# Patient Record
Sex: Male | Born: 1980 | Race: Black or African American | Hispanic: No | Marital: Single | Smoking: Light tobacco smoker
Health system: Southern US, Community
[De-identification: ages and names within clinical notes are randomized; demographics above are authoritative.]

## PROBLEM LIST (undated history)

## (undated) DIAGNOSIS — S62339A Displaced fracture of neck of unspecified metacarpal bone, initial encounter for closed fracture: Secondary | ICD-10-CM

## (undated) DIAGNOSIS — F32A Depression, unspecified: Secondary | ICD-10-CM

## (undated) DIAGNOSIS — F1910 Other psychoactive substance abuse, uncomplicated: Secondary | ICD-10-CM

## (undated) DIAGNOSIS — F329 Major depressive disorder, single episode, unspecified: Secondary | ICD-10-CM

## (undated) DIAGNOSIS — T7840XA Allergy, unspecified, initial encounter: Secondary | ICD-10-CM

## (undated) DIAGNOSIS — F419 Anxiety disorder, unspecified: Secondary | ICD-10-CM

## (undated) HISTORY — DX: Allergy, unspecified, initial encounter: T78.40XA

---
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
## 2002-07-30 ENCOUNTER — Encounter: Payer: Self-pay | Admitting: Emergency Medicine

## 2002-07-30 ENCOUNTER — Emergency Department (HOSPITAL_COMMUNITY): Admission: EM | Admit: 2002-07-30 | Discharge: 2002-07-30 | Payer: Self-pay | Admitting: Emergency Medicine

## 2005-03-10 ENCOUNTER — Emergency Department (HOSPITAL_COMMUNITY): Admission: EM | Admit: 2005-03-10 | Discharge: 2005-03-10 | Payer: Self-pay | Admitting: Family Medicine

## 2009-06-01 ENCOUNTER — Ambulatory Visit: Payer: Self-pay | Admitting: Internal Medicine

## 2009-06-01 DIAGNOSIS — J3089 Other allergic rhinitis: Secondary | ICD-10-CM | POA: Insufficient documentation

## 2009-06-01 DIAGNOSIS — N4889 Other specified disorders of penis: Secondary | ICD-10-CM | POA: Insufficient documentation

## 2009-06-01 LAB — CONVERTED CEMR LAB
Bilirubin Urine: NEGATIVE
Ketones, ur: NEGATIVE mg/dL
Leukocytes, UA: NEGATIVE
Nitrite: NEGATIVE
Specific Gravity, Urine: 1.025 (ref 1.000–1.030)
Urobilinogen, UA: 0.2 (ref 0.0–1.0)
pH: 7.5 (ref 5.0–8.0)

## 2009-06-02 ENCOUNTER — Encounter: Payer: Self-pay | Admitting: Internal Medicine

## 2009-06-09 ENCOUNTER — Ambulatory Visit: Payer: Self-pay | Admitting: Internal Medicine

## 2009-06-09 LAB — CONVERTED CEMR LAB
Chlamydia, Swab/Urine, PCR: NEGATIVE
GC Probe Amp, Urine: NEGATIVE

## 2009-09-02 ENCOUNTER — Ambulatory Visit: Payer: Self-pay | Admitting: Internal Medicine

## 2009-09-02 DIAGNOSIS — J309 Allergic rhinitis, unspecified: Secondary | ICD-10-CM | POA: Insufficient documentation

## 2009-09-02 DIAGNOSIS — L049 Acute lymphadenitis, unspecified: Secondary | ICD-10-CM | POA: Insufficient documentation

## 2009-09-02 LAB — CONVERTED CEMR LAB
Basophils Relative: 1.3 % (ref 0.0–3.0)
Eosinophils Relative: 6.8 % — ABNORMAL HIGH (ref 0.0–5.0)
Hemoglobin: 15.3 g/dL (ref 13.0–17.0)
Lymphocytes Relative: 40.9 % (ref 12.0–46.0)
Monocytes Relative: 12.2 % — ABNORMAL HIGH (ref 3.0–12.0)
Neutro Abs: 2 10*3/uL (ref 1.4–7.7)
Neutrophils Relative %: 38.8 % — ABNORMAL LOW (ref 43.0–77.0)
RBC: 4.91 M/uL (ref 4.22–5.81)
WBC: 5.1 10*3/uL (ref 4.5–10.5)

## 2009-10-24 ENCOUNTER — Ambulatory Visit: Payer: Self-pay | Admitting: Internal Medicine

## 2009-10-24 DIAGNOSIS — L989 Disorder of the skin and subcutaneous tissue, unspecified: Secondary | ICD-10-CM | POA: Insufficient documentation

## 2009-10-24 LAB — CONVERTED CEMR LAB
BUN: 8 mg/dL (ref 6–23)
Basophils Absolute: 0 10*3/uL (ref 0.0–0.1)
CO2: 35 meq/L — ABNORMAL HIGH (ref 19–32)
Chloride: 103 meq/L (ref 96–112)
GFR calc non Af Amer: 131.11 mL/min (ref >60)
Glucose, Bld: 62 mg/dL — ABNORMAL LOW (ref 70–99)
HCT: 44.9 % (ref 39.0–52.0)
Hemoglobin: 15.6 g/dL (ref 13.0–17.0)
Lymphs Abs: 1.9 10*3/uL (ref 0.7–4.0)
MCHC: 34.8 g/dL (ref 30.0–36.0)
MCV: 88.1 fL (ref 78.0–100.0)
Monocytes Absolute: 0.5 10*3/uL (ref 0.1–1.0)
Monocytes Relative: 7.5 % (ref 3.0–12.0)
Neutro Abs: 4.2 10*3/uL (ref 1.4–7.7)
Potassium: 4.9 meq/L (ref 3.5–5.1)
RDW: 12.8 % (ref 11.5–14.6)
Sodium: 142 meq/L (ref 135–145)

## 2009-10-28 ENCOUNTER — Ambulatory Visit: Payer: Self-pay | Admitting: Cardiovascular Disease

## 2009-11-21 ENCOUNTER — Telehealth: Payer: Self-pay | Admitting: Internal Medicine

## 2009-11-26 ENCOUNTER — Encounter: Payer: Self-pay | Admitting: Internal Medicine

## 2010-10-27 ENCOUNTER — Ambulatory Visit: Payer: Self-pay | Admitting: Internal Medicine

## 2010-10-27 DIAGNOSIS — F17200 Nicotine dependence, unspecified, uncomplicated: Secondary | ICD-10-CM | POA: Insufficient documentation

## 2010-10-27 LAB — CONVERTED CEMR LAB
ALT: 14 units/L (ref 0–53)
AST: 24 units/L (ref 0–37)
Albumin: 4.3 g/dL (ref 3.5–5.2)
Chloride: 103 meq/L (ref 96–112)
Eosinophils Relative: 9.2 % — ABNORMAL HIGH (ref 0.0–5.0)
GFR calc non Af Amer: 130.11 mL/min (ref >60.00)
Glucose, Bld: 86 mg/dL (ref 70–99)
HCT: 46.3 % (ref 39.0–52.0)
Hemoglobin: 16 g/dL (ref 13.0–17.0)
Lymphs Abs: 1.9 10*3/uL (ref 0.7–4.0)
Monocytes Relative: 11.8 % (ref 3.0–12.0)
Neutro Abs: 2.6 10*3/uL (ref 1.4–7.7)
Potassium: 5.8 meq/L — ABNORMAL HIGH (ref 3.5–5.1)
Sodium: 142 meq/L (ref 135–145)
TSH: 0.38 microintl units/mL (ref 0.35–5.50)
Total Protein: 7.6 g/dL (ref 6.0–8.3)
VLDL: 9.6 mg/dL (ref 0.0–40.0)
WBC: 5.7 10*3/uL (ref 4.5–10.5)

## 2010-12-12 NOTE — Progress Notes (Signed)
Summary: RESULTS  Phone Note Call from Patient Call back at [phone]   Summary of Call: Patient is requesting results of CT, labs & xray.  Initial call taken by: Lamar Sprinkles, CMA,  November 21, 2009 10:50 AM  Follow-up for Phone Call        labs and chest xray normal. CT showed some tooth disease but no problems with lmph nodes Follow-up by: Etta Grandchild MD,  November 21, 2009 10:54 AM  Additional Follow-up for Phone Call Additional follow up Details #1::        Patient notified. Additional Follow-up by: Lucious Groves,  November 21, 2009 4:25 PM

## 2010-12-12 NOTE — Letter (Signed)
Summary: Mertha Finders MD  Mertha Finders MD   Imported By: Sherian Rein 12/08/2009 12:03:33  _____________________________________________________________________  External Attachment:    Type:   Image     Comment:   External Document

## 2010-12-14 NOTE — Assessment & Plan Note (Signed)
Summary: Cpx/Bcbs/will come fasting/#/cd   Allergies: 1)  ! Doxycycline

## 2010-12-14 NOTE — Assessment & Plan Note (Signed)
Summary: Office visit   Vital Signs:  Patient profile:   30 year old male Height:      76 inches Weight:      170 pounds BMI:     20.77 O2 Sat:      97 % on Room air Temp:     97.4 degrees F oral Pulse rate:   61 / minute Pulse rhythm:   regular Resp:     16 per minute BP sitting:   110 / 70  (left arm) Cuff size:   large  Vitals Entered By: Rock Nephew CMA (October 27, 2010 8:18 AM)  O2 Flow:  Room air CC: Patient here for CPX w/labs Is Patient Diabetic? No Pain Assessment Patient in pain? no       Does patient need assistance? Functional Status Self care Ambulation Normal   Primary Care Venora Kautzman:  Etta Grandchild MD  CC:  Patient here for CPX w/labs.  History of Present Illness: He returns for a complete physical but he has complaints.  1. he wants to quit smoking and he wants me to write an Rx for nicotine patches so he can use his benefits card  2. he has a recurrence of an enlarged lymph node on his right side of his neck - this same nodal area was investigated one year ago and a CT scan was unremarkable, the area is not painful  Preventive Screening-Counseling & Management  Alcohol-Tobacco     Alcohol drinks/day: 1     Alcohol type: beer     >5/day in last 3 mos: no     Alcohol Counseling: not indicated; use of alcohol is not excessive or problematic     Feels need to cut down: no     Feels annoyed by complaints: no     Feels guilty re: drinking: no     Needs 'eye opener' in am: no     Smoking Status: quit     Smoking Cessation Counseling: yes     Smoke Cessation Stage: ready     Packs/Day: 0.75     Year Quit: 2001     Pack years: 8     Tobacco Counseling: to quit use of tobacco products  Hep-HIV-STD-Contraception     Hepatitis Risk: no risk noted     HIV Risk: no risk noted     STD Risk: no risk noted     Dental Visit-last 6 months no     Dental Care Counseling: to seek dental care; no dental care within six months     TSE monthly: yes     Testicular SE Education/Counseling to perform regular STE  Safety-Violence-Falls     Seat Belt Use: yes     Helmet Use: n/a     Firearms in the Home: no firearms in the home     Smoke Detectors: yes     Violence in the Home: no risk noted      Sexual History:  currently monogamous.        Drug Use:  never.        Blood Transfusions:  no.    Clinical Review Panels:  Immunizations   Last Tetanus Booster:  Tdap (08/18/2008)  Diabetes Management   Creatinine:  0.9 (10/24/2009)  CBC   WBC:  7.2 (10/24/2009)   RBC:  5.09 (10/24/2009)   Hgb:  15.6 (10/24/2009)   Hct:  44.9 (10/24/2009)   Platelets:  231.0 (10/24/2009)   MCV  88.1 (10/24/2009)   MCHC  34.8 (10/24/2009)   RDW  12.8 (10/24/2009)   PMN:  58.1 (10/24/2009)   Lymphs:  26.2 (10/24/2009)   Monos:  7.5 (10/24/2009)   Eosinophils:  8.2 (10/24/2009)   Basophil:  0.0 (10/24/2009)  Complete Metabolic Panel   Glucose:  62 (10/24/2009)   Sodium:  142 (10/24/2009)   Potassium:  4.9 (10/24/2009)   Chloride:  103 (10/24/2009)   CO2:  35 (10/24/2009)   BUN:  8 (10/24/2009)   Creatinine:  0.9 (10/24/2009)   Calcium:  9.6 (10/24/2009)   Medications Prior to Update: 1)  Allegra-D 24 Hour 180-240 Mg Xr24h-Tab (Fexofenadine-Pseudoephedrine) .... One By Mouth Qd As Needed For Allergies  Current Medications (verified): 1)  Nicoderm Cq 21 Mg/24hr Pt24 (Nicotine) .... Use One Per Day For 2 Weeks Then Decrease 2)  Nicoderm Cq 14 Mg/24hr Pt24 (Nicotine) .... Use One Per Day Fpr 2 Weeks Then Decrease 3)  Nicoderm Cq 7 Mg/24hr Pt24 (Nicotine) .... Use One Per Day For 2 Weeks Then Stop 4)  Cefuroxime Axetil 500 Mg Tabs (Cefuroxime Axetil) .... One By Mouth Two Times A Day  Allergies (verified): 1)  ! Doxycycline  Past History:  Past Medical History: Last updated: 09/02/2009 Allergic rhinitis  Past Surgical History: Last updated: 06/01/2009 Denies surgical history  Family History: Last updated: 06/01/2009 Family  History of Arthritis Family History Hypertension  Social History: Last updated: 06/01/2009 Occupation: [employer] Single Alcohol use-no Drug use-no Regular exercise-yes  Risk Factors: Alcohol Use: 1 (10/27/2010) >5 drinks/d w/in last 3 months: no (10/27/2010) Exercise: yes (06/01/2009)  Risk Factors: Smoking Status: quit (10/27/2010) Packs/Day: 0.75 (10/27/2010)  Family History: Reviewed history from 06/01/2009 and no changes required. Family History of Arthritis Family History Hypertension  Social History: Reviewed history from 06/01/2009 and no changes required. Occupation: Cook Single Alcohol use-no Drug use-no Regular exercise-yes Packs/Day:  0.75 Dental Care w/in 6 mos.:  no Seat Belt Use:  yes Drug Use:  never  Review of Systems       The patient complains of enlarged lymph nodes.  The patient denies anorexia, fever, weight loss, weight gain, decreased hearing, hoarseness, chest pain, syncope, dyspnea on exertion, peripheral edema, prolonged cough, headaches, hemoptysis, abdominal pain, hematuria, suspicious skin lesions, difficulty walking, depression, abnormal bleeding, angioedema, and testicular masses.   General:  Denies chills, fatigue, fever, loss of appetite, malaise, sleep disorder, and sweats.  Physical Exam  General:  alert, well-developed, well-nourished, well-hydrated, appropriate dress, normal appearance, healthy-appearing, cooperative to examination, good hygiene, and underweight appearing.   Head:  normocephalic, atraumatic, no abnormalities observed, and no abnormalities palpated.   Eyes:  vision grossly intact, pupils equal, pupils round, and pupils reactive to light.   Ears:  R ear normal and L ear normal.   Nose:  External nasal examination shows no deformity or inflammation. Nasal mucosa are pink and moist without lesions or exudates. Mouth:  Oral mucosa and oropharynx without lesions or exudates.  Teeth in good repair. Neck:  supple, full ROM, no  masses, no thyromegaly, no thyroid nodules or tenderness, no JVD, normal carotid upstroke, and no carotid bruits.  his right anterior cervical lymph node is slightly larger then the left but it is not tender, is freely mobile, and not warm or fluctuant. Lungs:  normal respiratory effort, no intercostal retractions, no accessory muscle use, normal breath sounds, no dullness, no fremitus, no crackles, and no wheezes.   Heart:  normal rate, regular rhythm, no murmur, no gallop, no rub, and no JVD.   Abdomen:  soft, non-tender, normal bowel sounds, no distention, no masses, no guarding, no rigidity, no rebound tenderness, no abdominal hernia, no inguinal hernia, no hepatomegaly, and no splenomegaly.   Genitalia:  circumcised, no hydrocele, no varicocele, no scrotal masses, no testicular masses or atrophy, no cutaneous lesions, and no urethral discharge.   Msk:  normal ROM, no joint tenderness, no joint swelling, no joint warmth, no redness over joints, no joint deformities, no joint instability, and no crepitation.   Pulses:  R and L carotid,radial,femoral,dorsalis pedis and posterior tibial pulses are full and equal bilaterally Extremities:  No clubbing, cyanosis, edema, or deformity noted with normal full range of motion of all joints.   Neurologic:  No cranial nerve deficits noted. Station and gait are normal. Plantar reflexes are down-going bilaterally. DTRs are symmetrical throughout. Sensory, motor and coordinative functions appear intact. Skin:  turgor normal, color normal, no rashes, no suspicious lesions, no ecchymoses, no petechiae, no purpura, no ulcerations, and no edema.   Cervical Nodes:  no posterior cervical adenopathy and R anterior LN enlarged.   Axillary Nodes:  no R axillary adenopathy and no L axillary adenopathy.   Inguinal Nodes:  no R inguinal adenopathy and no L inguinal adenopathy.   Psych:  Cognition and judgment appear intact. Alert and cooperative with normal attention span and  concentration. No apparent delusions, illusions, hallucinations   Impression & Recommendations:  Problem # 1:  ROUTINE GENERAL MEDICAL EXAM@HEALTH  CARE FACL (ICD-V70.0) Assessment New  Orders: Venipuncture (04540) TLB-Lipid Panel (80061-LIPID) TLB-BMP (Basic Metabolic Panel-BMET) (80048-METABOL) TLB-CBC Platelet - w/Differential (85025-CBCD) TLB-Hepatic/Liver Function Pnl (80076-HEPATIC) TLB-TSH (Thyroid Stimulating Hormone) (84443-TSH) TLB-CRP-High Sensitivity (C-Reactive Protein) (86140-FCRP) TLB-Sedimentation Rate (ESR) (85652-ESR)  Problem # 2:  TOBACCO USE (ICD-305.1)  His updated medication list for this problem includes:    Nicoderm Cq 21 Mg/24hr Pt24 (Nicotine) ..... Use one per day for 2 weeks then decrease    Nicoderm Cq 14 Mg/24hr Pt24 (Nicotine) ..... Use one per day fpr 2 weeks then decrease    Nicoderm Cq 7 Mg/24hr Pt24 (Nicotine) ..... Use one per day for 2 weeks then stop  Orders: Venipuncture (98119) TLB-Lipid Panel (80061-LIPID) TLB-BMP (Basic Metabolic Panel-BMET) (80048-METABOL) TLB-CBC Platelet - w/Differential (85025-CBCD) TLB-Hepatic/Liver Function Pnl (80076-HEPATIC) TLB-TSH (Thyroid Stimulating Hormone) (84443-TSH) TLB-CRP-High Sensitivity (C-Reactive Protein) (86140-FCRP) TLB-Sedimentation Rate (ESR) (85652-ESR) Tobacco use cessation intermediate 3-10 minutes (14782)  Problem # 3:  ACUTE LYMPHADENITIS (ICD-683) Assessment: Unchanged  His updated medication list for this problem includes:    Cefuroxime Axetil 500 Mg Tabs (Cefuroxime axetil) ..... One by mouth two times a day  Orders: Venipuncture (95621) TLB-Lipid Panel (80061-LIPID) TLB-BMP (Basic Metabolic Panel-BMET) (80048-METABOL) TLB-CBC Platelet - w/Differential (85025-CBCD) TLB-Hepatic/Liver Function Pnl (80076-HEPATIC) TLB-TSH (Thyroid Stimulating Hormone) (84443-TSH) TLB-CRP-High Sensitivity (C-Reactive Protein) (86140-FCRP) TLB-Sedimentation Rate (ESR) (85652-ESR) Tobacco use  cessation intermediate 3-10 minutes (30865)  Complete Medication List: 1)  Nicoderm Cq 21 Mg/24hr Pt24 (Nicotine) .... Use one per day for 2 weeks then decrease 2)  Nicoderm Cq 14 Mg/24hr Pt24 (Nicotine) .... Use one per day fpr 2 weeks then decrease 3)  Nicoderm Cq 7 Mg/24hr Pt24 (Nicotine) .... Use one per day for 2 weeks then stop 4)  Cefuroxime Axetil 500 Mg Tabs (Cefuroxime axetil) .... One by mouth two times a day  Patient Instructions: 1)  Please schedule a follow-up appointment in 2 weeks. 2)  Tobacco is very bad for your health and your loved ones! You Should stop smoking!. 3)  Stop Smoking Tips: Choose a Quit date. Cut down before  the Quit date. decide what you will do as a substitute when you feel the urge to smoke(gum,toothpick,exercise). 4)  Take your antibiotic as prescribed until ALL of it is gone, but stop if you develop a rash or swelling and contact our office as soon as possible. Prescriptions: CEFUROXIME AXETIL 500 MG TABS (CEFUROXIME AXETIL) One by mouth two times a day  #28 x 0   Entered and Authorized by:   Etta Grandchild MD   Signed by:   Etta Grandchild MD on 10/27/2010   Method used:   Print then Give to Patient   RxID:   [id number] NICODERM CQ 7 MG/24HR PT24 (NICOTINE) use one per day for 2 weeks then stop  #14 x 0   Entered and Authorized by:   Etta Grandchild MD   Signed by:   Etta Grandchild MD on 10/27/2010   Method used:   Print then Give to Patient   RxID:   [id number] NICODERM CQ 14 MG/24HR PT24 (NICOTINE) use one per day fpr 2 weeks then decrease  #14 x 0   Entered and Authorized by:   Etta Grandchild MD   Signed by:   Etta Grandchild MD on 10/27/2010   Method used:   Print then Give to Patient   RxID:   [id number] NICODERM CQ 21 MG/24HR PT24 (NICOTINE) use one per day for 2 weeks then decrease  #14 x 0   Entered and Authorized by:   Etta Grandchild MD   Signed by:   Etta Grandchild MD on 10/27/2010   Method used:   Print then Give  to Patient   RxID:   [id number]    Orders Added: 1)  Venipuncture [29518] 2)  TLB-Lipid Panel [80061-LIPID] 3)  TLB-BMP (Basic Metabolic Panel-BMET) [80048-METABOL] 4)  TLB-CBC Platelet - w/Differential [85025-CBCD] 5)  TLB-Hepatic/Liver Function Pnl [80076-HEPATIC] 6)  TLB-TSH (Thyroid Stimulating Hormone) [84443-TSH] 7)  TLB-CRP-High Sensitivity (C-Reactive Protein) [86140-FCRP] 8)  TLB-Sedimentation Rate (ESR) [85652-ESR] 9)  Est. Patient 18-39 years [99395] 10)  Tobacco use cessation intermediate 3-10 minutes [99406] 11)  Est. Patient Level IV [84166]

## 2010-12-14 NOTE — Letter (Signed)
Summary: Lipid Letter  [facility]  [address] [place], [place] [address]   Phone: [phone]  Fax: [phone]    10/27/2010  [facility] [address] [place], [place]  [address]  Dear Carron Brazen:  We have carefully reviewed your last lipid profile from 10/27/2010 and the results are noted below with a summary of recommendations for lipid management.    Cholesterol:       120     Goal: <200   HDL "good" Cholesterol:   62.95     Goal: >40   LDL "bad" Cholesterol:   75     Goal: <130   Triglycerides:       48.0     Goal: <150        TLC Diet (Therapeutic Lifestyle Change): Saturated Fats & Transfatty acids should be kept < 7% of total calories ***Reduce Saturated Fats Polyunstaurated Fat can be up to 10% of total calories Monounsaturated Fat Fat can be up to 20% of total calories Total Fat should be no greater than 25-35% of total calories Carbohydrates should be 50-60% of total calories Protein should be approximately 15% of total calories Fiber should be at least 20-30 grams a day ***Increased fiber may help lower LDL Total Cholesterol should be < 200mg /day Consider adding plant stanol/sterols to diet (example: Benacol spread) ***A higher intake of unsaturated fat may reduce Triglycerides and Increase HDL    Adjunctive Measures (may lower LIPIDS and reduce risk of Heart Attack) include: Aerobic Exercise (20-30 minutes 3-4 times a week) Limit Alcohol Consumption Weight Reduction Aspirin 75-81 mg a day by mouth (if not allergic or contraindicated) Dietary Fiber 20-30 grams a day by mouth     Current Medications: 1)    Nicoderm Cq 21 Mg/24hr Pt24 (Nicotine) .... Use one per day for 2 weeks then decrease 2)    Nicoderm Cq 14 Mg/24hr Pt24 (Nicotine) .... Use one per day fpr 2 weeks then decrease 3)    Nicoderm Cq 7 Mg/24hr Pt24 (Nicotine) .... Use one per day for 2 weeks then stop 4)    Cefuroxime Axetil 500 Mg Tabs (Cefuroxime axetil) .... One by mouth  two times a day  If you have any questions, please call. We appreciate being able to work with you.   Sincerely,    [facility] Etta Grandchild MD

## 2010-12-14 NOTE — Letter (Signed)
Summary: Results Follow-up Letter  [facility]  [address] [place], [place] [address]   Phone: [phone]  Fax: [phone]    10/27/2010  [address] [place], [place]  [address]  Dear Mr. Prochnow,   The following are the results of your recent test(s):  Test     Result     Kidney     normal Potassium     high CBC       high eosinophils - allergy cells Liver       normal Thyroid     normal   _________________________________________________________  Please call for an appointment soon _________________________________________________________ _________________________________________________________ _________________________________________________________  Sincerely,  Sanda Linger MD [facility]

## 2011-06-22 ENCOUNTER — Ambulatory Visit: Payer: Self-pay | Admitting: Internal Medicine

## 2011-06-25 ENCOUNTER — Ambulatory Visit (INDEPENDENT_AMBULATORY_CARE_PROVIDER_SITE_OTHER): Payer: BC Managed Care – PPO | Admitting: Internal Medicine

## 2011-06-25 ENCOUNTER — Ambulatory Visit (INDEPENDENT_AMBULATORY_CARE_PROVIDER_SITE_OTHER)
Admission: RE | Admit: 2011-06-25 | Discharge: 2011-06-25 | Disposition: A | Discharge status: Home / Self Care | Payer: BC Managed Care – PPO | Source: Ambulatory Visit | Attending: Internal Medicine | Admitting: Internal Medicine

## 2011-06-25 ENCOUNTER — Encounter: Payer: Self-pay | Admitting: Internal Medicine

## 2011-06-25 VITALS — BP 102/58 | HR 72 | Temp 98.5°F | Resp 16 | Wt 177.5 lb

## 2011-06-25 DIAGNOSIS — R05 Cough: Secondary | ICD-10-CM

## 2011-06-25 DIAGNOSIS — R590 Localized enlarged lymph nodes: Secondary | ICD-10-CM

## 2011-06-25 DIAGNOSIS — R059 Cough, unspecified: Secondary | ICD-10-CM

## 2011-06-25 DIAGNOSIS — J988 Other specified respiratory disorders: Secondary | ICD-10-CM

## 2011-06-25 DIAGNOSIS — J209 Acute bronchitis, unspecified: Secondary | ICD-10-CM | POA: Insufficient documentation

## 2011-06-25 DIAGNOSIS — R599 Enlarged lymph nodes, unspecified: Secondary | ICD-10-CM

## 2011-06-25 MED ORDER — AMOXICILLIN-POT CLAVULANATE 500-125 MG PO TABS: 1.0000 | ORAL_TABLET | Freq: Three times a day (TID) | ORAL | Status: AC

## 2011-06-25 NOTE — Assessment & Plan Note (Signed)
Start augmentin 

## 2011-06-25 NOTE — Assessment & Plan Note (Signed)
He continues to be concerned about a cervical LN on the right side and he wants to see if it needs to be biopsied, it feels normal to me but I will oblige him and refer to ENT

## 2011-06-25 NOTE — Patient Instructions (Signed)
Bronchitis Bronchitis is the body's way of reacting to injury and/or infection (inflammation) of the bronchi. Bronchi are the air tubes that extend from the windpipe into the lungs. If the inflammation becomes severe, it may cause shortness of breath.  CAUSES Inflammation may be caused by:  A virus.   Germs (bacteria).   Dust.   Allergens.   Pollutants and many other irritants.  The cells lining the bronchial tree are covered with tiny hairs (cilia). These constantly beat upward, away from the lungs, toward the mouth. This keeps the lungs free of pollutants. When these cells become too irritated and are unable to do their job, mucus begins to develop. This causes the characteristic cough of bronchitis. The cough clears the lungs when the cilia are unable to do their job. Without either of these protective mechanisms, the mucus would settle in the lungs. Then you would develop pneumonia. Smoking is a common cause of bronchitis and can contribute to pneumonia. Stopping this habit is the single most important thing you can do to help yourself. TREATMENT  Your caregiver may prescribe an antibiotic if the cough is caused by bacteria. Also, medicines that open up your airways make it easier to breathe. Your caregiver may also recommend or prescribe an expectorant. It will loosen the mucus to be coughed up. Only take over-the-counter or prescription medicines for pain, discomfort, or fever as directed by your caregiver.   Removing whatever causes the problem (smoking, for example) is critical to preventing the problem from getting worse.   Cough suppressants may be prescribed for relief of cough symptoms.   Inhaled medicines may be prescribed to help with symptoms now and to help prevent problems from returning.   For those with recurrent (chronic) bronchitis, there may be a need for steroid medicines.  SEEK IMMEDIATE MEDICAL CARE IF:  During treatment, you develop more pus-like mucus  (purulent sputum).   You or your child has an oral temperature above 100.5, not controlled by medicine.   Your baby is older than 3 months with a rectal temperature of 102 F (38.9 C) or higher.   Your baby is 3 months old or younger with a rectal temperature of 100.4 F (38 C) or higher.   You become progressively more ill.   You have increased difficulty breathing, wheezing, or shortness of breath.  It is necessary to seek immediate medical care if you are elderly or sick from any other disease. MAKE SURE YOU:  Understand these instructions.   Will watch your condition.   Will get help right away if you are not doing well or get worse.  Document Released: 10/29/2005 Document Re-Released: 01/23/2010 [employer] Patient Information 2011 [employer]. 

## 2011-06-25 NOTE — Assessment & Plan Note (Signed)
Check a cxr for pna, masses, etc

## 2011-06-25 NOTE — Progress Notes (Signed)
Subjective:    Patient ID: Shawn Nicholson, male    DOB: Feb 17, 1981, 30 y.o.   MRN: [id number]  Cough This is a recurrent problem. The current episode started more than 1 year ago. The problem has been gradually worsening. The problem occurs every few hours. The cough is productive of purulent sputum. Associated symptoms include postnasal drip and rhinorrhea. Pertinent negatives include no chest pain, chills, ear congestion, ear pain, fever, headaches, heartburn, hemoptysis, myalgias, rash, sore throat, shortness of breath, sweats, weight loss or wheezing. The symptoms are aggravated by fumes. Risk factors for lung disease include smoking/tobacco exposure. He has tried nothing for the symptoms. His past medical history is significant for pneumonia. There is no history of asthma, bronchiectasis, bronchitis, COPD, emphysema or environmental allergies.      Review of Systems  Constitutional: Negative for fever, chills, weight loss, diaphoresis, activity change, appetite change, fatigue and unexpected weight change.  HENT: Positive for rhinorrhea and postnasal drip. Negative for ear pain, sore throat, facial swelling and neck pain.   Respiratory: Positive for cough. Negative for hemoptysis, shortness of breath and wheezing.   Cardiovascular: Negative for chest pain.  Gastrointestinal: Negative for heartburn.  Musculoskeletal: Negative for myalgias.  Skin: Negative for rash.  Neurological: Negative for dizziness, tremors, seizures, syncope, facial asymmetry, speech difficulty, weakness, light-headedness, numbness and headaches.  Hematological: Positive for adenopathy (right side of neck). Negative for environmental allergies. Does not bruise/bleed easily.  Psychiatric/Behavioral: Negative.        Objective:   Physical Exam  Vitals reviewed. Constitutional: He is oriented to person, place, and time. He appears well-developed and well-nourished. No distress.  HENT:  Head: No trismus in the  jaw.  Mouth/Throat: Oropharynx is clear and moist and mucous membranes are normal. Mucous membranes are not pale and not cyanotic. He does not have dentures. No oral lesions. Abnormal dentition (lots of tooth decay and dental caries). Dental caries present. No dental abscesses, uvula swelling or lacerations. No oropharyngeal exudate, posterior oropharyngeal edema, posterior oropharyngeal erythema or tonsillar abscesses.  Eyes: Conjunctivae and EOM are normal. Pupils are equal, round, and reactive to light. Right eye exhibits no discharge. Left eye exhibits no discharge. No scleral icterus.  Neck: Normal range of motion. No JVD present. No tracheal deviation present. No thyromegaly present.  Cardiovascular: Normal rate, regular rhythm, normal heart sounds and intact distal pulses.  Exam reveals no friction rub.   No murmur heard. Pulmonary/Chest: Effort normal and breath sounds normal. No stridor. No respiratory distress. He has no wheezes. He has no rales. He exhibits no tenderness.  Abdominal: Soft. Bowel sounds are normal. He exhibits no distension and no mass. There is no tenderness. There is no rebound and no guarding.  Musculoskeletal: Normal range of motion. He exhibits no edema and no tenderness.  Lymphadenopathy:       Head (right side): No submental, no submandibular, no tonsillar, no preauricular, no posterior auricular and no occipital adenopathy present.       Head (left side): No submental, no submandibular, no tonsillar, no preauricular, no posterior auricular and no occipital adenopathy present.    He has cervical adenopathy.       Right cervical: Superficial cervical adenopathy present. No deep cervical and no posterior cervical adenopathy present.      Left cervical: No superficial cervical, no deep cervical and no posterior cervical adenopathy present.    He has no axillary adenopathy.       Right axillary: No pectoral and no lateral  adenopathy present.       Left axillary: No  pectoral and no lateral adenopathy present.      Right: No inguinal, no supraclavicular and no epitrochlear adenopathy present.       Left: No inguinal, no supraclavicular and no epitrochlear adenopathy present.  Neurological: He is alert and oriented to person, place, and time. He has normal reflexes. He displays normal reflexes. No cranial nerve deficit. He exhibits normal muscle tone. Coordination normal.  Skin: Skin is warm and dry. No rash noted. He is not diaphoretic. No erythema. No pallor.  Psychiatric: He has a normal mood and affect. His behavior is normal. Judgment and thought content normal.          Assessment & Plan:

## 2011-06-26 NOTE — Progress Notes (Signed)
Addended by: Etta Grandchild on: 06/26/2011 07:21 AM   Modules accepted: Orders

## 2012-10-10 ENCOUNTER — Encounter: Payer: BC Managed Care – PPO | Admitting: Internal Medicine

## 2012-10-10 DIAGNOSIS — Z0289 Encounter for other administrative examinations: Secondary | ICD-10-CM

## 2012-10-15 ENCOUNTER — Emergency Department (HOSPITAL_COMMUNITY)
Admission: EM | Admit: 2012-10-15 | Discharge: 2012-10-15 | Disposition: A | Discharge status: Home / Self Care | Payer: BC Managed Care – PPO | Source: Home / Self Care | Attending: Family Medicine | Admitting: Family Medicine

## 2012-10-15 ENCOUNTER — Encounter (HOSPITAL_COMMUNITY): Payer: Self-pay | Admitting: Emergency Medicine

## 2012-10-15 DIAGNOSIS — A630 Anogenital (venereal) warts: Secondary | ICD-10-CM

## 2012-10-15 DIAGNOSIS — R197 Diarrhea, unspecified: Secondary | ICD-10-CM

## 2012-10-15 NOTE — ED Notes (Signed)
Reports abd pain for three/four days with diarrhea.  Denies nausea and vomiting.  Patient wants to get tested for STD

## 2012-10-15 NOTE — ED Provider Notes (Signed)
History     CSN: [id number]  Arrival date & time 10/15/12  1532   First MD Initiated Contact with Patient 10/15/12 1713      Chief Complaint  Patient presents with  . Abdominal Pain  . SEXUALLY TRANSMITTED DISEASE    (Consider location/radiation/quality/duration/timing/severity/associated sxs/prior treatment) Patient is a 31 y.o. male presenting with abdominal pain. The history is provided by the patient.  Abdominal Pain The primary symptoms of the illness include abdominal pain and diarrhea. The primary symptoms of the illness do not include nausea, vomiting, hematemesis, hematochezia or dysuria. The current episode started 2 days ago. The onset of the illness was gradual.  Associated with: rash on penis for 3-4 weeks. Symptoms associated with the illness do not include urgency or frequency.    Past Medical History  Diagnosis Date  . Allergy     History reviewed. No pertinent past surgical history.  Family History  Problem Relation Age of Onset  . Arthritis Other   . Hypertension Other     History  Substance Use Topics  . Smoking status: Former Smoker    Quit date: 10/24/2010  . Smokeless tobacco: Not on file  . Alcohol Use: 1.8 oz/week    3 Cans of beer per week      Review of Systems  Gastrointestinal: Positive for abdominal pain and diarrhea. Negative for nausea, vomiting, hematochezia and hematemesis.  Genitourinary: Negative for dysuria, urgency and frequency.    Allergies  Doxycycline  Home Medications  No current outpatient prescriptions on file.  BP 105/77  Pulse 63  Temp 98.6 F (37 C) (Oral)  Resp 20  SpO2 100%  Physical Exam  Nursing note and vitals reviewed. Constitutional: He is oriented to person, place, and time. He appears well-developed and well-nourished.  Abdominal: Soft. Bowel sounds are normal. He exhibits no distension and no mass. There is no tenderness. There is no rebound and no guarding.  Neurological: He is alert and  oriented to person, place, and time.  Skin: Skin is warm and dry.    ED Course  Procedures (including critical care time)  Labs Reviewed - No data to display No results found.   1. Warts, genital   2. Acute diarrhea       MDM          Linna Hoff, MD 10/15/12 1726

## 2012-10-18 ENCOUNTER — Emergency Department (HOSPITAL_COMMUNITY): Payer: BC Managed Care – PPO

## 2012-10-18 ENCOUNTER — Emergency Department (HOSPITAL_COMMUNITY)
Admission: EM | Admit: 2012-10-18 | Discharge: 2012-10-18 | Disposition: A | Discharge status: Home / Self Care | Payer: BC Managed Care – PPO | Attending: Emergency Medicine | Admitting: Emergency Medicine

## 2012-10-18 ENCOUNTER — Encounter (HOSPITAL_COMMUNITY): Payer: Self-pay | Admitting: *Deleted

## 2012-10-18 DIAGNOSIS — IMO0002 Reserved for concepts with insufficient information to code with codable children: Secondary | ICD-10-CM | POA: Insufficient documentation

## 2012-10-18 DIAGNOSIS — S62309A Unspecified fracture of unspecified metacarpal bone, initial encounter for closed fracture: Secondary | ICD-10-CM | POA: Insufficient documentation

## 2012-10-18 DIAGNOSIS — Y92009 Unspecified place in unspecified non-institutional (private) residence as the place of occurrence of the external cause: Secondary | ICD-10-CM | POA: Insufficient documentation

## 2012-10-18 DIAGNOSIS — Y9389 Activity, other specified: Secondary | ICD-10-CM | POA: Insufficient documentation

## 2012-10-18 DIAGNOSIS — M7989 Other specified soft tissue disorders: Secondary | ICD-10-CM | POA: Insufficient documentation

## 2012-10-18 DIAGNOSIS — Z87891 Personal history of nicotine dependence: Secondary | ICD-10-CM | POA: Insufficient documentation

## 2012-10-18 DIAGNOSIS — S6291XA Unspecified fracture of right wrist and hand, initial encounter for closed fracture: Secondary | ICD-10-CM

## 2012-10-18 MED ORDER — HYDROCODONE-ACETAMINOPHEN 5-325 MG PO TABS: 1.0000 | ORAL_TABLET | Freq: Four times a day (QID) | ORAL | Status: DC | PRN
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
## 2012-10-18 NOTE — Progress Notes (Signed)
Orthopedic Tech Progress Note Patient Details:  Shawn Nicholson [facility] Mar 08, 1981 [id number]  Ortho Devices Type of Ortho Device: Ace wrap;Ulna gutter splint Ortho Device/Splint Location: right hand Ortho Device/Splint Interventions: Application   [name] 10/18/2012, 11:10 PM

## 2012-10-22 ENCOUNTER — Encounter (HOSPITAL_BASED_OUTPATIENT_CLINIC_OR_DEPARTMENT_OTHER): Payer: Self-pay | Admitting: *Deleted

## 2012-10-22 ENCOUNTER — Other Ambulatory Visit: Payer: Self-pay | Admitting: Orthopedic Surgery

## 2012-10-23 ENCOUNTER — Ambulatory Visit (HOSPITAL_BASED_OUTPATIENT_CLINIC_OR_DEPARTMENT_OTHER)
Admission: RE | Admit: 2012-10-23 | Discharge: 2012-10-23 | Disposition: A | Discharge status: Home / Self Care | Payer: BC Managed Care – PPO | Source: Ambulatory Visit | Attending: Orthopedic Surgery | Admitting: Orthopedic Surgery

## 2012-10-23 ENCOUNTER — Encounter (HOSPITAL_BASED_OUTPATIENT_CLINIC_OR_DEPARTMENT_OTHER): Payer: Self-pay | Admitting: Anesthesiology

## 2012-10-23 ENCOUNTER — Encounter (HOSPITAL_BASED_OUTPATIENT_CLINIC_OR_DEPARTMENT_OTHER)
Admission: RE | Disposition: A | Discharge status: Home / Self Care | Payer: Self-pay | Source: Ambulatory Visit | Attending: Orthopedic Surgery

## 2012-10-23 ENCOUNTER — Ambulatory Visit (HOSPITAL_BASED_OUTPATIENT_CLINIC_OR_DEPARTMENT_OTHER): Payer: BC Managed Care – PPO | Admitting: Anesthesiology

## 2012-10-23 ENCOUNTER — Encounter (HOSPITAL_BASED_OUTPATIENT_CLINIC_OR_DEPARTMENT_OTHER): Payer: Self-pay | Admitting: Orthopedic Surgery

## 2012-10-23 DIAGNOSIS — W2209XA Striking against other stationary object, initial encounter: Secondary | ICD-10-CM | POA: Insufficient documentation

## 2012-10-23 DIAGNOSIS — Z8261 Family history of arthritis: Secondary | ICD-10-CM | POA: Insufficient documentation

## 2012-10-23 DIAGNOSIS — Z8249 Family history of ischemic heart disease and other diseases of the circulatory system: Secondary | ICD-10-CM | POA: Insufficient documentation

## 2012-10-23 DIAGNOSIS — Z880 Allergy status to penicillin: Secondary | ICD-10-CM | POA: Insufficient documentation

## 2012-10-23 DIAGNOSIS — F3289 Other specified depressive episodes: Secondary | ICD-10-CM | POA: Insufficient documentation

## 2012-10-23 DIAGNOSIS — F329 Major depressive disorder, single episode, unspecified: Secondary | ICD-10-CM | POA: Insufficient documentation

## 2012-10-23 DIAGNOSIS — S62339A Displaced fracture of neck of unspecified metacarpal bone, initial encounter for closed fracture: Secondary | ICD-10-CM | POA: Insufficient documentation

## 2012-10-23 HISTORY — DX: Anxiety disorder, unspecified: F41.9

## 2012-10-23 HISTORY — DX: Depression, unspecified: F32.A

## 2012-10-23 HISTORY — PX: OPEN REDUCTION INTERNAL FIXATION (ORIF) METACARPAL: SHX6234

## 2012-10-23 HISTORY — DX: Major depressive disorder, single episode, unspecified: F32.9

## 2012-10-23 HISTORY — DX: Displaced fracture of neck of unspecified metacarpal bone, initial encounter for closed fracture: S62.339A

## 2012-10-23 HISTORY — PX: CLOSED REDUCTION METACARPAL WITH PERCUTANEOUS PINNING: SHX5613

## 2012-10-23 LAB — POCT HEMOGLOBIN-HEMACUE: Hemoglobin: 14.5 g/dL (ref 13.0–17.0)

## 2012-10-23 SURGERY — CLOSED REDUCTION, FRACTURE, METACARPAL BONE, WITH PERCUTANEOUS PINNING
Anesthesia: General | Site: Hand | Laterality: Right | Wound class: Clean

## 2012-10-23 MED ORDER — VANCOMYCIN HCL IN DEXTROSE 1-5 GM/200ML-% IV SOLN
1000.0000 mg | INTRAVENOUS | Status: AC
  Administered 2012-10-23: 1000 mg via INTRAVENOUS

## 2012-10-23 MED ORDER — FENTANYL CITRATE 0.05 MG/ML IJ SOLN
INTRAMUSCULAR | Status: DC | PRN
  Administered 2012-10-23 (×2): 50 ug via INTRAVENOUS

## 2012-10-23 MED ORDER — OXYCODONE-ACETAMINOPHEN 5-325 MG PO TABS: ORAL_TABLET | ORAL | Status: DC

## 2012-10-23 MED ORDER — MIDAZOLAM HCL 5 MG/5ML IJ SOLN
INTRAMUSCULAR | Status: DC | PRN
  Administered 2012-10-23: 2 mg via INTRAVENOUS

## 2012-10-23 MED ORDER — LACTATED RINGERS IV SOLN
INTRAVENOUS | Status: DC
  Administered 2012-10-23 (×2): via INTRAVENOUS

## 2012-10-23 MED ORDER — FENTANYL CITRATE 0.05 MG/ML IJ SOLN
100.0000 ug | Freq: Once | INTRAMUSCULAR | Status: AC
  Administered 2012-10-23: 100 ug via INTRAVENOUS

## 2012-10-23 MED ORDER — PROPOFOL 10 MG/ML IV BOLUS
INTRAVENOUS | Status: DC | PRN
  Administered 2012-10-23: 40 mg via INTRAVENOUS
  Administered 2012-10-23: 200 mg via INTRAVENOUS

## 2012-10-23 MED ORDER — LIDOCAINE HCL (CARDIAC) 20 MG/ML IV SOLN
INTRAVENOUS | Status: DC | PRN
  Administered 2012-10-23: 75 mg via INTRAVENOUS

## 2012-10-23 MED ORDER — DEXAMETHASONE SODIUM PHOSPHATE 4 MG/ML IJ SOLN
INTRAMUSCULAR | Status: DC | PRN
  Administered 2012-10-23: 10 mg via INTRAVENOUS

## 2012-10-23 MED ORDER — ONDANSETRON HCL 4 MG/2ML IJ SOLN
INTRAMUSCULAR | Status: DC | PRN
  Administered 2012-10-23: 4 mg via INTRAVENOUS

## 2012-10-23 MED ORDER — CHLORHEXIDINE GLUCONATE 4 % EX LIQD: 60.0000 mL | Freq: Once | CUTANEOUS | Status: DC

## 2012-10-23 MED ORDER — MIDAZOLAM HCL 2 MG/2ML IJ SOLN
1.0000 mg | INTRAMUSCULAR | Status: DC | PRN
  Administered 2012-10-23: 2 mg via INTRAVENOUS

## 2012-10-23 SURGICAL SUPPLY — 69 items
BANDAGE ELASTIC 3 VELCRO ST LF (GAUZE/BANDAGES/DRESSINGS) ×3 IMPLANT
BANDAGE GAUZE ELAST BULKY 4 IN (GAUZE/BANDAGES/DRESSINGS) ×3 IMPLANT
BIT DRILL 1.1 (BIT) ×3
BIT DRILL 60X20X1.1XQC TMX (BIT) IMPLANT
BIT DRL 60X20X1.1XQC TMX (BIT) ×2
BLADE MINI RND TIP GREEN BEAV (BLADE) IMPLANT
BLADE SURG 15 STRL LF DISP TIS (BLADE) ×4 IMPLANT
BLADE SURG 15 STRL SS (BLADE) ×6
BNDG CMPR 9X4 STRL LF SNTH (GAUZE/BANDAGES/DRESSINGS) ×2
BNDG CMPR MD 5X2 ELC HKLP STRL (GAUZE/BANDAGES/DRESSINGS)
BNDG ELASTIC 2 VLCR STRL LF (GAUZE/BANDAGES/DRESSINGS) IMPLANT
BNDG ESMARK 4X9 LF (GAUZE/BANDAGES/DRESSINGS) ×3 IMPLANT
CHLORAPREP W/TINT 26ML (MISCELLANEOUS) ×3 IMPLANT
CLOTH BEACON ORANGE TIMEOUT ST (SAFETY) ×3 IMPLANT
CORDS BIPOLAR (ELECTRODE) ×3 IMPLANT
COVER MAYO STAND STRL (DRAPES) ×3 IMPLANT
COVER TABLE BACK 60X90 (DRAPES) ×3 IMPLANT
CUFF TOURNIQUET SINGLE 18IN (TOURNIQUET CUFF) ×3 IMPLANT
DRAPE EXTREMITY T 121X128X90 (DRAPE) ×3 IMPLANT
DRAPE OEC MINIVIEW 54X84 (DRAPES) ×3 IMPLANT
DRAPE SURG 17X23 STRL (DRAPES) ×3 IMPLANT
GAUZE XEROFORM 1X8 LF (GAUZE/BANDAGES/DRESSINGS) ×3 IMPLANT
GLOVE BIO SURGEON STRL SZ 6.5 (GLOVE) ×1 IMPLANT
GLOVE BIO SURGEON STRL SZ7.5 (GLOVE) ×3 IMPLANT
GLOVE BIOGEL PI IND STRL 7.0 (GLOVE) IMPLANT
GLOVE BIOGEL PI IND STRL 8 (GLOVE) ×2 IMPLANT
GLOVE BIOGEL PI IND STRL 8.5 (GLOVE) IMPLANT
GLOVE BIOGEL PI INDICATOR 7.0 (GLOVE) ×1
GLOVE BIOGEL PI INDICATOR 8 (GLOVE) ×1
GLOVE BIOGEL PI INDICATOR 8.5 (GLOVE)
GLOVE SURG ORTHO 8.0 STRL STRW (GLOVE) IMPLANT
GOWN PREVENTION PLUS XLARGE (GOWN DISPOSABLE) ×3 IMPLANT
GOWN PREVENTION PLUS XXLARGE (GOWN DISPOSABLE) ×3 IMPLANT
GOWN STRL REIN XL XLG (GOWN DISPOSABLE) ×3 IMPLANT
K-WIRE DBL TRONS .035X6 (WIRE) ×6
KWIRE DBL TRONS .035X6 (WIRE) IMPLANT
NDL HYPO 25X1 1.5 SAFETY (NEEDLE) IMPLANT
NEEDLE HYPO 22GX1.5 SAFETY (NEEDLE) IMPLANT
NEEDLE HYPO 25X1 1.5 SAFETY (NEEDLE) IMPLANT
NS IRRIG 1000ML POUR BTL (IV SOLUTION) ×3 IMPLANT
PACK BASIN DAY SURGERY FS (CUSTOM PROCEDURE TRAY) ×3 IMPLANT
PAD CAST 3X4 CTTN HI CHSV (CAST SUPPLIES) ×2 IMPLANT
PAD CAST 4YDX4 CTTN HI CHSV (CAST SUPPLIES) IMPLANT
PADDING CAST ABS 4INX4YD NS (CAST SUPPLIES) ×1
PADDING CAST ABS COTTON 4X4 ST (CAST SUPPLIES) ×2 IMPLANT
PADDING CAST COTTON 3X4 STRL (CAST SUPPLIES) ×3
PADDING CAST COTTON 4X4 STRL (CAST SUPPLIES)
PLATE STRAIGHT LOCK 1.5 (Plate) ×1 IMPLANT
SCREW NL 1.5X11 WRIST (Screw) ×4 IMPLANT
SCREW NL 1.5X12 (Screw) ×3 IMPLANT
SCREW NONIOC 1.5 10M (Screw) ×1 IMPLANT
SLEEVE SCD COMPRESS KNEE MED (MISCELLANEOUS) ×1 IMPLANT
SPLINT PLASTER CAST XFAST 3X15 (CAST SUPPLIES) IMPLANT
SPLINT PLASTER CAST XFAST 4X15 (CAST SUPPLIES) IMPLANT
SPLINT PLASTER XTRA FAST SET 4 (CAST SUPPLIES)
SPLINT PLASTER XTRA FASTSET 3X (CAST SUPPLIES) ×20
SPONGE GAUZE 4X4 12PLY (GAUZE/BANDAGES/DRESSINGS) ×3 IMPLANT
STOCKINETTE 4X48 STRL (DRAPES) ×3 IMPLANT
SUT ETHILON 3 0 PS 1 (SUTURE) IMPLANT
SUT ETHILON 4 0 PS 2 18 (SUTURE) ×3 IMPLANT
SUT MERSILENE 4 0 P 3 (SUTURE) IMPLANT
SUT VIC AB 3-0 PS1 18 (SUTURE)
SUT VIC AB 3-0 PS1 18XBRD (SUTURE) IMPLANT
SUT VICRYL 4-0 PS2 18IN ABS (SUTURE) ×1 IMPLANT
SYR BULB 3OZ (MISCELLANEOUS) ×3 IMPLANT
SYR CONTROL 10ML LL (SYRINGE) IMPLANT
TOWEL OR 17X24 6PK STRL BLUE (TOWEL DISPOSABLE) ×6 IMPLANT
UNDERPAD 30X30 INCONTINENT (UNDERPADS AND DIAPERS) ×3 IMPLANT
WATER STERILE IRR 1000ML POUR (IV SOLUTION) ×2 IMPLANT
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
## 2012-10-23 NOTE — Progress Notes (Signed)
Assisted Dr. Randa Evens with right, axillary block. Side rails up, monitors on throughout procedure. See vital signs in flow sheet. Tolerated Procedure well.

## 2012-10-23 NOTE — Anesthesia Preprocedure Evaluation (Signed)
Anesthesia Evaluation  Patient identified by MRN, date of birth, ID band Patient awake    Reviewed: Allergy & Precautions, H&P , NPO status , Patient's Chart, lab work & pertinent test results  Airway Mallampati: II      Dental   Pulmonary neg pulmonary ROS,  breath sounds clear to auscultation        Cardiovascular negative cardio ROS  Rhythm:Regular Rate:Normal     Neuro/Psych Anxiety Depression    GI/Hepatic negative GI ROS, Neg liver ROS,   Endo/Other  negative endocrine ROS  Renal/GU      Musculoskeletal   Abdominal   Peds  Hematology Patient reports no history of bleeding abnormalities.   Anesthesia Other Findings   Reproductive/Obstetrics                           Anesthesia Physical Anesthesia Plan  ASA: I  Anesthesia Plan: General   Post-op Pain Management:    Induction: Intravenous  Airway Management Planned: LMA  Additional Equipment:   Intra-op Plan:   Post-operative Plan:   Informed Consent: I have reviewed the patients History and Physical, chart, labs and discussed the procedure including the risks, benefits and alternatives for the proposed anesthesia with the patient or authorized representative who has indicated his/her understanding and acceptance.   Dental advisory given  Plan Discussed with: CRNA, Anesthesiologist and Surgeon  Anesthesia Plan Comments:         Anesthesia Quick Evaluation

## 2012-10-23 NOTE — ED Provider Notes (Signed)
Medical screening examination/treatment/procedure(s) were performed by non-physician practitioner and as supervising physician I was immediately available for consultation/collaboration.  Raeford Razor, MD 10/23/12 [phone]

## 2012-10-23 NOTE — Anesthesia Procedure Notes (Addendum)
Procedure Name: LMA Insertion Date/Time: 10/23/2012 10:49 AM Performed by: Zenia Resides D Pre-anesthesia Checklist: Patient identified, Emergency Drugs available, Suction available, Patient being monitored and Timeout performed Patient Re-evaluated:Patient Re-evaluated prior to inductionOxygen Delivery Method: Circle System Utilized Preoxygenation: Pre-oxygenation with 100% oxygen Intubation Type: IV induction Ventilation: Mask ventilation without difficulty LMA: LMA inserted LMA Size: 4.0 Number of attempts: 1 Airway Equipment and Method: bite block Placement Confirmation: positive ETCO2 and breath sounds checked- equal and bilateral Tube secured with: Tape Dental Injury: Teeth and Oropharynx as per pre-operative assessment    Anesthesia Regional Block:  Axillary brachial plexus block  Pre-Anesthetic Checklist: ,, timeout performed, Correct Patient, Correct Site, Correct Laterality, Correct Procedure, Correct Position, site marked, Risks and benefits discussed,  Surgical consent,  Pre-op evaluation,  At surgeon's request and post-op pain management  Laterality: Right  Prep: chloraprep       Needles:  Injection technique: Single-shot  Needle Type: Echogenic Stimulator Needle          Additional Needles:  Procedures: Doppler guided and nerve stimulator Axillary brachial plexus block  Nerve Stimulator or Paresthesia:  Response: 0.5 mA,   Additional Responses:   Narrative:  Start time: 10/23/2012 9:30 AM End time: 10/23/2012 9:45 AM Injection made incrementally with aspirations every 5 mL.  Performed by: Personally     A functioning IV was confirmed and monitors were applied.  Sterile prep and drape, hand hygiene and sterile gloves were used.  Negative aspiration and test dose prior to incremental administration of local anesthetic. The patient tolerated the procedure well.

## 2012-10-23 NOTE — Op Note (Signed)
Dictation [phone]

## 2012-10-23 NOTE — Anesthesia Postprocedure Evaluation (Signed)
  Anesthesia Post-op Note  Patient: Shawn Nicholson  Procedure(s) Performed: Procedure(s) (LRB) with comments: CLOSED REDUCTION METACARPAL WITH PERCUTANEOUS PINNING (Right) OPEN REDUCTION INTERNAL FIXATION (ORIF) METACARPAL () - RIGHT SMALL AND RING FINGER ORIF VERSUS CLOSED REDUCTION PERCUTANEOUS PINNING  Patient Location: PACU  Anesthesia Type:General  Level of Consciousness: awake  Airway and Oxygen Therapy: Patient Spontanous Breathing  Post-op Pain: mild  Post-op Assessment: Post-op Vital signs reviewed  Post-op Vital Signs: Reviewed  Complications: No apparent anesthesia complications

## 2012-10-23 NOTE — Transfer of Care (Signed)
Immediate Anesthesia Transfer of Care Note  Patient: Shawn Nicholson  Procedure(s) Performed: Procedure(s) (LRB) with comments: CLOSED REDUCTION METACARPAL WITH PERCUTANEOUS PINNING (Right) OPEN REDUCTION INTERNAL FIXATION (ORIF) METACARPAL () - RIGHT SMALL AND RING FINGER ORIF VERSUS CLOSED REDUCTION PERCUTANEOUS PINNING  Patient Location: PACU  Anesthesia Type:General and Regional  Level of Consciousness: awake and alert   Airway & Oxygen Therapy: Patient Spontanous Breathing and Patient connected to face mask oxygen  Post-op Assessment: Report given to PACU RN and Post -op Vital signs reviewed and stable  Post vital signs: Reviewed and stable  Complications: No apparent anesthesia complications
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
## 2012-10-27 ENCOUNTER — Encounter (HOSPITAL_BASED_OUTPATIENT_CLINIC_OR_DEPARTMENT_OTHER): Payer: Self-pay | Admitting: Orthopedic Surgery

## 2012-11-11 ENCOUNTER — Ambulatory Visit: Payer: BC Managed Care – PPO | Admitting: Internal Medicine

## 2012-11-17 ENCOUNTER — Encounter: Payer: BC Managed Care – PPO | Admitting: Internal Medicine

## 2012-11-21 ENCOUNTER — Ambulatory Visit (INDEPENDENT_AMBULATORY_CARE_PROVIDER_SITE_OTHER): Payer: BC Managed Care – PPO | Admitting: Internal Medicine

## 2012-11-21 ENCOUNTER — Encounter: Payer: Self-pay | Admitting: Internal Medicine

## 2012-11-21 ENCOUNTER — Other Ambulatory Visit (INDEPENDENT_AMBULATORY_CARE_PROVIDER_SITE_OTHER): Payer: BC Managed Care – PPO

## 2012-11-21 VITALS — BP 108/62 | HR 63 | Temp 98.0°F | Resp 16 | Wt 166.2 lb

## 2012-11-21 DIAGNOSIS — R21 Rash and other nonspecific skin eruption: Secondary | ICD-10-CM | POA: Insufficient documentation

## 2012-11-21 DIAGNOSIS — Z23 Encounter for immunization: Secondary | ICD-10-CM | POA: Insufficient documentation

## 2012-11-21 DIAGNOSIS — R590 Localized enlarged lymph nodes: Secondary | ICD-10-CM

## 2012-11-21 DIAGNOSIS — R599 Enlarged lymph nodes, unspecified: Secondary | ICD-10-CM

## 2012-11-21 DIAGNOSIS — Z0000 Encounter for general adult medical examination without abnormal findings: Secondary | ICD-10-CM

## 2012-11-21 DIAGNOSIS — N498 Inflammatory disorders of other specified male genital organs: Secondary | ICD-10-CM

## 2012-11-21 DIAGNOSIS — R10815 Periumbilic abdominal tenderness: Secondary | ICD-10-CM | POA: Insufficient documentation

## 2012-11-21 DIAGNOSIS — N492 Inflammatory disorders of scrotum: Secondary | ICD-10-CM

## 2012-11-21 LAB — URINALYSIS, ROUTINE W REFLEX MICROSCOPIC
Bilirubin Urine: NEGATIVE
Nitrite: NEGATIVE
Urine Glucose: NEGATIVE
pH: 6 (ref 5.0–8.0)

## 2012-11-21 LAB — LIPID PANEL
Cholesterol: 107 mg/dL (ref 0–200)
HDL: 27.7 mg/dL — ABNORMAL LOW (ref >39.00)

## 2012-11-21 LAB — COMPREHENSIVE METABOLIC PANEL
ALT: 17 U/L (ref 0–53)
AST: 20 U/L (ref 0–37)
CO2: 30 mEq/L (ref 19–32)
Calcium: 9.4 mg/dL (ref 8.4–10.5)
Chloride: 105 mEq/L (ref 96–112)
Creatinine, Ser: 0.9 mg/dL (ref 0.4–1.5)
GFR: 121.9 mL/min (ref >60.00)
Potassium: 4 mEq/L (ref 3.5–5.1)
Sodium: 141 mEq/L (ref 135–145)
Total Protein: 7.5 g/dL (ref 6.0–8.3)

## 2012-11-21 LAB — CBC WITH DIFFERENTIAL/PLATELET
Basophils Absolute: 0.1 10*3/uL (ref 0.0–0.1)
Eosinophils Absolute: 0.4 10*3/uL (ref 0.0–0.7)
Hemoglobin: 14.4 g/dL (ref 13.0–17.0)
Lymphocytes Relative: 31 % (ref 12.0–46.0)
MCHC: 34.7 g/dL (ref 30.0–36.0)
Monocytes Relative: 10.7 % (ref 3.0–12.0)
Neutro Abs: 2.8 10*3/uL (ref 1.4–7.7)
Neutrophils Relative %: 50.5 % (ref 43.0–77.0)
Platelets: 300 10*3/uL (ref 150.0–400.0)
RDW: 13.8 % (ref 11.5–14.6)

## 2012-11-21 LAB — LIPASE: Lipase: 31 U/L (ref 11.0–59.0)

## 2012-11-21 LAB — SEDIMENTATION RATE: Sed Rate: 7 mm/hr (ref 0–22)

## 2012-11-21 NOTE — Progress Notes (Signed)
Subjective:    Patient ID: Shawn Nicholson, male    DOB: 1981/05/06, 32 y.o.   MRN: [id number]  Abdominal Pain This is a recurrent problem. The current episode started more than 1 month ago. The onset quality is gradual. The problem occurs intermittently. The most recent episode lasted 1 month. The problem has been unchanged. The pain is located in the suprapubic region. The pain is at a severity of 1/10. The pain is mild. The quality of the pain is aching. The abdominal pain does not radiate. Associated symptoms include hematuria and weight loss. Pertinent negatives include no anorexia, arthralgias, belching, constipation, diarrhea, dysuria, fever, flatus, frequency, headaches, hematochezia, melena, myalgias, nausea or vomiting. Nothing aggravates the pain. The pain is relieved by nothing. He has tried nothing for the symptoms. The treatment provided no relief.      Review of Systems  Constitutional: Positive for weight loss. Negative for fever, chills, diaphoresis, activity change, appetite change, fatigue and unexpected weight change.  HENT: Negative.   Eyes: Negative.   Respiratory: Negative.   Cardiovascular: Negative for chest pain, palpitations and leg swelling.  Gastrointestinal: Positive for abdominal pain. Negative for nausea, vomiting, diarrhea, constipation, blood in stool, melena, hematochezia, abdominal distention, anal bleeding, rectal pain, anorexia and flatus.  Genitourinary: Positive for hematuria and scrotal swelling. Negative for dysuria, urgency, frequency, flank pain, decreased urine volume, discharge, penile swelling, enuresis, difficulty urinating, genital sores, penile pain and testicular pain.  Musculoskeletal: Negative for myalgias, back pain, joint swelling, arthralgias and gait problem.  Skin: Positive for rash (on his penis and his scrotum). Negative for color change, pallor and wound.  Neurological: Negative.  Negative for headaches.  Hematological: Positive for  adenopathy. Does not bruise/bleed easily.  Psychiatric/Behavioral: Negative.        Objective:   Physical Exam  Vitals reviewed. Constitutional: He is oriented to person, place, and time. He appears well-developed and well-nourished. No distress.  HENT:  Head: Normocephalic and atraumatic.  Mouth/Throat: Oropharynx is clear and moist. No oropharyngeal exudate.  Eyes: Conjunctivae normal are normal. Right eye exhibits no discharge. Left eye exhibits no discharge. No scleral icterus.  Neck: Normal range of motion. Neck supple. No JVD present. No tracheal deviation present. No thyromegaly present.  Cardiovascular: Normal rate, regular rhythm, normal heart sounds and intact distal pulses.  Exam reveals no gallop and no friction rub.   No murmur heard. Pulmonary/Chest: Effort normal and breath sounds normal. No stridor. No respiratory distress. He has no wheezes. He has no rales. He exhibits no tenderness.  Abdominal: Soft. Bowel sounds are normal. He exhibits no distension and no mass. There is no tenderness. There is no rebound and no guarding. Hernia confirmed negative in the right inguinal area and confirmed negative in the left inguinal area.  Genitourinary: Rectum normal, prostate normal, testes normal and penis normal. Rectal exam shows no external hemorrhoid, no internal hemorrhoid, no fissure, no mass, no tenderness and anal tone normal. Guaiac negative stool.    Prostate is not enlarged and not tender. Right testis shows no mass, no swelling and no tenderness. Right testis is descended. Left testis shows no mass, no swelling and no tenderness. Left testis is descended. Circumcised. No penile erythema or penile tenderness. No discharge found.  Musculoskeletal: Normal range of motion. He exhibits no edema and no tenderness.  Lymphadenopathy:    He has no cervical adenopathy.    He has no axillary adenopathy.       Right: Inguinal adenopathy present. No supraclavicular  and no epitrochlear  adenopathy present.       Left: Inguinal adenopathy present. No supraclavicular and no epitrochlear adenopathy present.  Neurological: He is oriented to person, place, and time.  Skin: Skin is warm and dry. No rash noted. He is not diaphoretic. No erythema. No pallor.  Psychiatric: He has a normal mood and affect. His behavior is normal. Judgment and thought content normal.      Lab Results  Component Value Date   WBC 5.7 10/27/2010   HGB 14.5 10/23/2012   HCT 46.3 10/27/2010   PLT 258.0 10/27/2010   GLUCOSE 86 10/27/2010   CHOL 120 10/27/2010   TRIG 48.0 10/27/2010   HDL 35.30* 10/27/2010   LDLCALC 75 10/27/2010   ALT 14 10/27/2010   AST 24 10/27/2010   NA 142 10/27/2010   K 5.8* 10/27/2010   CL 103 10/27/2010   CREATININE 0.9 10/27/2010   BUN 13 10/27/2010   CO2 31 10/27/2010   TSH 0.38 10/27/2010      Assessment & Plan:

## 2012-11-21 NOTE — Assessment & Plan Note (Signed)
Will check himfor syph, herpes, and with a cbc as well

## 2012-11-21 NOTE — Patient Instructions (Signed)
Abdominal Pain  Abdominal pain can be caused by many things. Your caregiver decides the seriousness of your pain by an examination and possibly blood tests and X-rays. Many cases can be observed and treated at home. Most abdominal pain is not caused by a disease and will probably improve without treatment. However, in many cases, more time must pass before a clear cause of the pain can be found. Before that point, it may not be known if you need more testing, or if hospitalization or surgery is needed.  HOME CARE INSTRUCTIONS   · Do not take laxatives unless directed by your caregiver.  · Take pain medicine only as directed by your caregiver.  · Only take over-the-counter or prescription medicines for pain, discomfort, or fever as directed by your caregiver.  · Try a clear liquid diet (broth, tea, or water) for as long as directed by your caregiver. Slowly move to a bland diet as tolerated.  SEEK IMMEDIATE MEDICAL CARE IF:   · The pain does not go away.  · You have a fever.  · You keep throwing up (vomiting).  · The pain is felt only in portions of the abdomen. Pain in the right side could possibly be appendicitis. In an adult, pain in the left lower portion of the abdomen could be colitis or diverticulitis.  · You pass bloody or Dirks tarry stools.  MAKE SURE YOU:   · Understand these instructions.  · Will watch your condition.  · Will get help right away if you are not doing well or get worse.  Document Released: 08/08/2005 Document Revised: 01/21/2012 Document Reviewed: 06/16/2008  [employer]® Patient Information ©2013 [employer].

## 2012-11-21 NOTE — Assessment & Plan Note (Signed)
Wound culture sent I will check him for syph and herpes

## 2012-11-21 NOTE — Assessment & Plan Note (Signed)
Exam done Vaccines were updated Labs ordered Pt ed material was given 

## 2012-11-21 NOTE — Assessment & Plan Note (Signed)
His exam is normal I will check his labs and UA today to look for pathology

## 2012-11-24 ENCOUNTER — Other Ambulatory Visit: Payer: BC Managed Care – PPO

## 2012-11-24 ENCOUNTER — Encounter: Payer: Self-pay | Admitting: Internal Medicine

## 2012-11-24 DIAGNOSIS — R21 Rash and other nonspecific skin eruption: Secondary | ICD-10-CM

## 2012-11-24 LAB — HSV(HERPES SMPLX)ABS-I+II(IGG+IGM)-BLD: Herpes Simplex Vrs I&II-IgM Ab (EIA): 0.64 INDEX

## 2012-11-25 DIAGNOSIS — N492 Inflammatory disorders of scrotum: Secondary | ICD-10-CM | POA: Insufficient documentation

## 2012-11-25 MED ORDER — SULFAMETHOXAZOLE-TMP DS 800-160 MG PO TABS: 1.0000 | ORAL_TABLET | Freq: Two times a day (BID) | ORAL | Status: AC

## 2012-11-25 NOTE — Addendum Note (Signed)
Addended by: Etta Grandchild on: 11/25/2012 11:36 AM   Modules accepted: Orders

## 2012-11-25 NOTE — Assessment & Plan Note (Signed)
11/25/12 clx is returning and it looks suspicious for MRSA so I have asked him to start SMX-TMP

## 2012-11-27 LAB — WOUND CULTURE

## 2012-12-12 ENCOUNTER — Encounter: Payer: Self-pay | Admitting: Internal Medicine

## 2012-12-12 ENCOUNTER — Ambulatory Visit (INDEPENDENT_AMBULATORY_CARE_PROVIDER_SITE_OTHER): Payer: BC Managed Care – PPO | Admitting: Internal Medicine

## 2012-12-12 VITALS — BP 118/88 | HR 61 | Temp 98.8°F | Resp 16

## 2012-12-12 DIAGNOSIS — R590 Localized enlarged lymph nodes: Secondary | ICD-10-CM

## 2012-12-12 DIAGNOSIS — R599 Enlarged lymph nodes, unspecified: Secondary | ICD-10-CM

## 2012-12-12 DIAGNOSIS — N498 Inflammatory disorders of other specified male genital organs: Secondary | ICD-10-CM

## 2012-12-12 DIAGNOSIS — N492 Inflammatory disorders of scrotum: Secondary | ICD-10-CM

## 2012-12-12 NOTE — Progress Notes (Signed)
  Subjective:    Patient ID: Shawn Nicholson, male    DOB: Sep 14, 1981, 32 y.o.   MRN: [id number]  HPI  He returns for f/up and he tells me that he feels well and that his s/s have resolved.  Review of Systems  Constitutional: Negative for fever, chills, diaphoresis, activity change, appetite change, fatigue and unexpected weight change.  HENT: Negative.   Eyes: Negative.   Respiratory: Negative.   Cardiovascular: Negative.   Gastrointestinal: Negative for nausea, abdominal pain, diarrhea, constipation and blood in stool.  Genitourinary: Negative.  Negative for urgency, hematuria, flank pain, decreased urine volume, discharge and difficulty urinating.  Musculoskeletal: Negative.   Skin: Negative.  Negative for color change, pallor, rash and wound.  Neurological: Negative.   Hematological: Negative for adenopathy. Does not bruise/bleed easily.       Objective:   Physical Exam  Vitals reviewed. Constitutional: He is oriented to person, place, and time. He appears well-developed.  HENT:  Head: Normocephalic and atraumatic.  Mouth/Throat: No oropharyngeal exudate.  Eyes: Conjunctivae normal are normal. Right eye exhibits no discharge. Left eye exhibits no discharge. No scleral icterus.  Neck: Normal range of motion. Neck supple. No JVD present. No tracheal deviation present. No thyromegaly present.  Cardiovascular: Normal rate, regular rhythm, normal heart sounds and intact distal pulses.  Exam reveals no gallop and no friction rub.   No murmur heard. Pulmonary/Chest: Effort normal and breath sounds normal. No stridor. No respiratory distress. He has no wheezes. He has no rales. He exhibits no tenderness.  Abdominal: Soft. Bowel sounds are normal. He exhibits no distension and no mass. There is no tenderness. There is no rebound and no guarding. Hernia confirmed negative in the right inguinal area and confirmed negative in the left inguinal area.  Genitourinary: Testes normal and penis  normal. Right testis shows no mass, no swelling and no tenderness. Right testis is descended. Left testis shows no mass, no swelling and no tenderness. Left testis is descended. Circumcised. No penile erythema or penile tenderness. No discharge found.  Musculoskeletal: Normal range of motion. He exhibits no edema and no tenderness.  Lymphadenopathy:    He has no cervical adenopathy.    He has no axillary adenopathy.       Right: No inguinal, no supraclavicular and no epitrochlear adenopathy present.       Left: No inguinal, no supraclavicular and no epitrochlear adenopathy present.  Neurological: He is oriented to person, place, and time.  Skin: Skin is warm and dry. No rash noted. He is not diaphoretic. No erythema. No pallor.  Psychiatric: He has a normal mood and affect. His behavior is normal. Judgment and thought content normal.      Lab Results  Component Value Date   WBC 5.6 11/21/2012   HGB 14.4 11/21/2012   HCT 41.6 11/21/2012   PLT 300.0 11/21/2012   GLUCOSE 83 11/21/2012   CHOL 107 11/21/2012   TRIG 67.0 11/21/2012   HDL 27.70* 11/21/2012   LDLCALC 66 11/21/2012   ALT 17 11/21/2012   AST 20 11/21/2012   NA 141 11/21/2012   K 4.0 11/21/2012   CL 105 11/21/2012   CREATININE 0.9 11/21/2012   BUN 11 11/21/2012   CO2 30 11/21/2012   TSH 0.81 11/21/2012      Assessment & Plan:

## 2012-12-12 NOTE — Assessment & Plan Note (Signed)
resolved 

## 2013-09-17 ENCOUNTER — Other Ambulatory Visit: Payer: Self-pay

## 2013-11-23 ENCOUNTER — Encounter: Payer: Self-pay | Admitting: Internal Medicine

## 2013-11-23 ENCOUNTER — Ambulatory Visit (INDEPENDENT_AMBULATORY_CARE_PROVIDER_SITE_OTHER): Payer: No Typology Code available for payment source | Admitting: Internal Medicine

## 2013-11-23 ENCOUNTER — Other Ambulatory Visit (INDEPENDENT_AMBULATORY_CARE_PROVIDER_SITE_OTHER): Payer: No Typology Code available for payment source

## 2013-11-23 VITALS — BP 120/80 | HR 67 | Temp 98.2°F | Resp 16 | Ht 77.5 in | Wt 193.0 lb

## 2013-11-23 DIAGNOSIS — Z0000 Encounter for general adult medical examination without abnormal findings: Secondary | ICD-10-CM

## 2013-11-23 LAB — LIPID PANEL
CHOLESTEROL: 115 mg/dL (ref 0–200)
HDL: 35.3 mg/dL — AB (ref >39.00)
LDL CALC: 46 mg/dL (ref 0–99)
TRIGLYCERIDES: 170 mg/dL — AB (ref 0.0–149.0)
Total CHOL/HDL Ratio: 3
VLDL: 34 mg/dL (ref 0.0–40.0)

## 2013-11-23 NOTE — Progress Notes (Signed)
Pre visit review using our clinic review tool, if applicable. No additional management support is needed unless otherwise documented below in the visit note. 

## 2013-11-23 NOTE — Patient Instructions (Signed)

## 2013-11-23 NOTE — Progress Notes (Signed)
   Subjective:    Patient ID: Shawn Nicholson, male    DOB: 09/13/1981, 33 y.o.   MRN: [id number]  HPI  He returns for a physical and he tells me that he feels well and offers no complaints.  Review of Systems  All other systems reviewed and are negative.       Objective:   Physical Exam  Vitals reviewed. Constitutional: He is oriented to person, place, and time. He appears well-developed and well-nourished. No distress.  HENT:  Head: Normocephalic and atraumatic.  Mouth/Throat: Oropharynx is clear and moist. No oropharyngeal exudate.  Eyes: Conjunctivae are normal. Right eye exhibits no discharge. Left eye exhibits no discharge. No scleral icterus.  Neck: Normal range of motion. Neck supple. No JVD present. No tracheal deviation present. No thyromegaly present.  Cardiovascular: Normal rate, regular rhythm, normal heart sounds and intact distal pulses.  Exam reveals no gallop and no friction rub.   No murmur heard. Pulmonary/Chest: Effort normal and breath sounds normal. No stridor. No respiratory distress. He has no wheezes. He has no rales. He exhibits no tenderness.  Abdominal: Soft. Bowel sounds are normal. He exhibits no distension and no mass. There is no tenderness. There is no rebound and no guarding. Hernia confirmed negative in the right inguinal area and confirmed negative in the left inguinal area.  Genitourinary: Testes normal and penis normal. Right testis shows no mass, no swelling and no tenderness. Right testis is descended. Left testis shows no mass, no swelling and no tenderness. Left testis is descended. Circumcised. No penile erythema or penile tenderness. No discharge found.  Musculoskeletal: Normal range of motion. He exhibits no edema and no tenderness.  Lymphadenopathy:    He has no cervical adenopathy.       Right: No inguinal adenopathy present.       Left: No inguinal adenopathy present.  Neurological: He is oriented to person, place, and time.  Skin:  Skin is warm and dry. No rash noted. He is not diaphoretic. No erythema. No pallor.  Psychiatric: He has a normal mood and affect. His behavior is normal. Judgment and thought content normal.     Lab Results  Component Value Date   WBC 5.6 11/21/2012   HGB 14.4 11/21/2012   HCT 41.6 11/21/2012   PLT 300.0 11/21/2012   GLUCOSE 83 11/21/2012   CHOL 107 11/21/2012   TRIG 67.0 11/21/2012   HDL 27.70* 11/21/2012   LDLCALC 66 11/21/2012   ALT 17 11/21/2012   AST 20 11/21/2012   NA 141 11/21/2012   K 4.0 11/21/2012   CL 105 11/21/2012   CREATININE 0.9 11/21/2012   BUN 11 11/21/2012   CO2 30 11/21/2012   TSH 0.81 11/21/2012       Assessment & Plan:

## 2013-11-24 ENCOUNTER — Encounter: Payer: Self-pay | Admitting: Internal Medicine

## 2014-04-19 IMAGING — CR DG HAND COMPLETE 3+V*R*
3 series · 3 of 3 positions shown · non-contrast
Comparison: None.

CLINICAL DATA: Right hand pain and swelling following a punching
injury.

RIGHT HAND - COMPLETE 3+ VIEW

[x hand pa right]
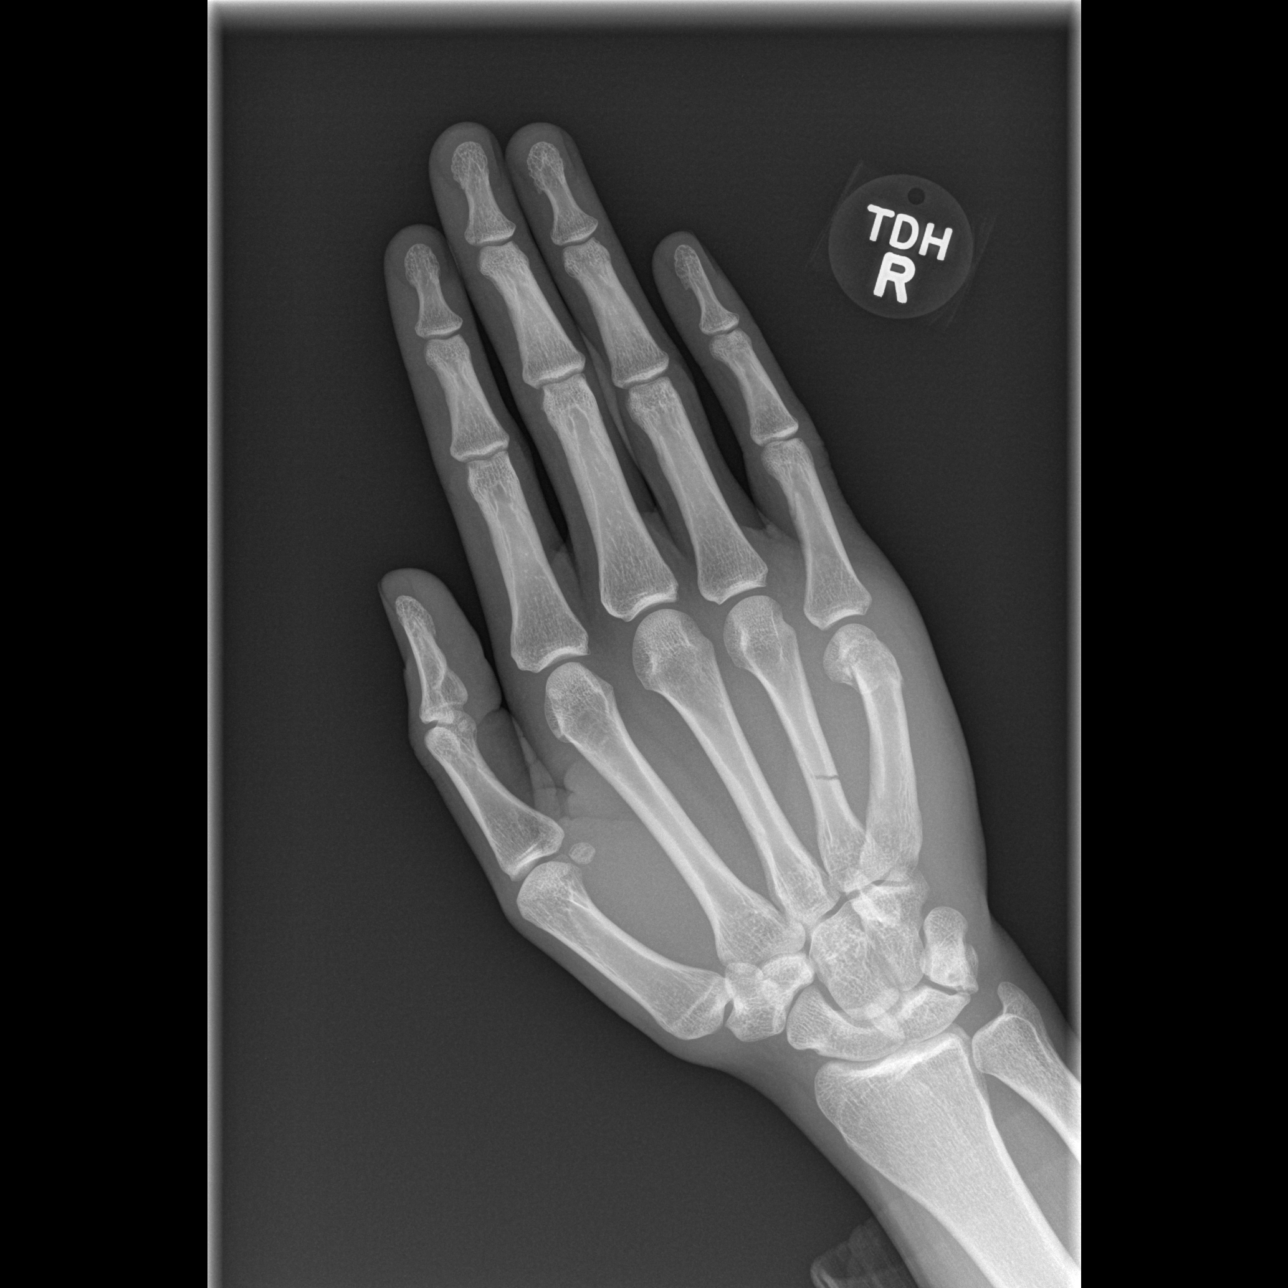

[x hand oblique right]
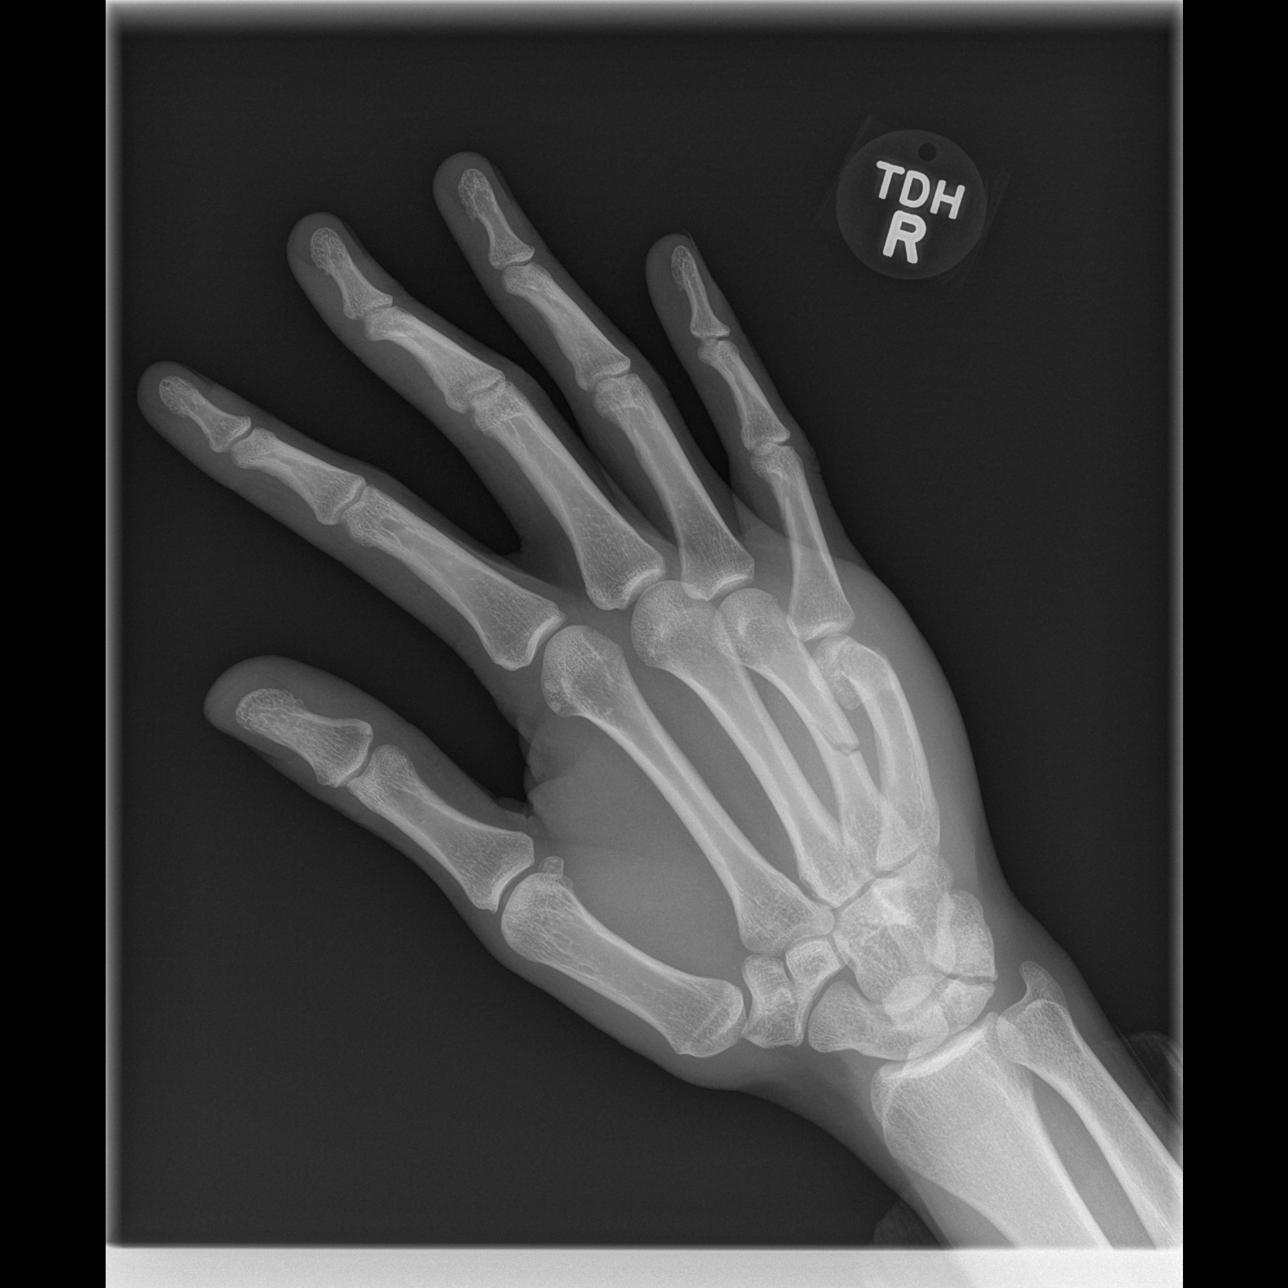

[x hand lat right]
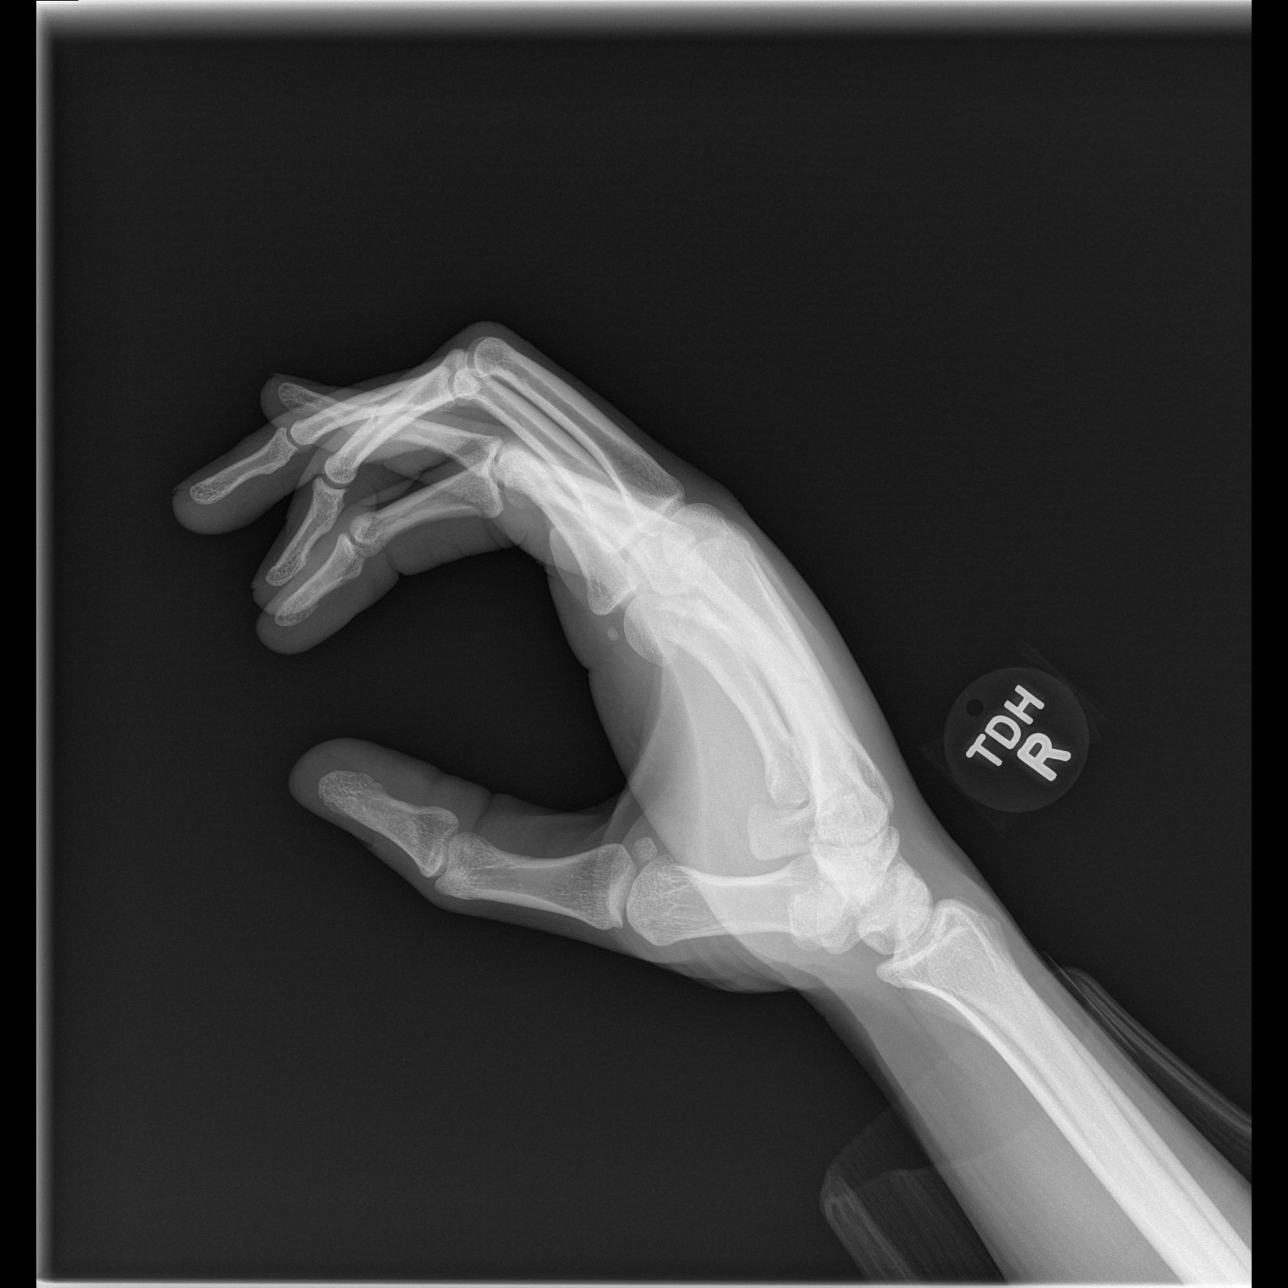

[3 of 3 positions shown; findings below may reference images not displayed]

FINDINGS: Oblique fracture through the proximal shaft of the fourth
metacarpal with mild ventral angulation of the distal fragment,
without significant displacement.  There is also a fracture through
the fifth metacarpal neck with mild impaction as well as one third
shaft width of inferior displacement and inferior angulation of the
distal fragment. Lunotriquetral degenerative changes also noted.
IMPRESSION: 1.  Fourth and fifth metacarpal fractures, as described above.
2.  Lunotriquetral degenerative changes.

## 2014-08-27 ENCOUNTER — Other Ambulatory Visit: Payer: Self-pay

## 2015-05-25 ENCOUNTER — Ambulatory Visit (INDEPENDENT_AMBULATORY_CARE_PROVIDER_SITE_OTHER): Payer: No Typology Code available for payment source | Admitting: Internal Medicine

## 2015-05-25 ENCOUNTER — Encounter: Payer: Self-pay | Admitting: Internal Medicine

## 2015-05-25 ENCOUNTER — Other Ambulatory Visit (INDEPENDENT_AMBULATORY_CARE_PROVIDER_SITE_OTHER): Payer: No Typology Code available for payment source

## 2015-05-25 VITALS — BP 126/78 | HR 73 | Temp 97.8°F | Resp 16 | Ht 78.0 in | Wt 210.0 lb

## 2015-05-25 DIAGNOSIS — Z0000 Encounter for general adult medical examination without abnormal findings: Secondary | ICD-10-CM

## 2015-05-25 LAB — LIPID PANEL
CHOLESTEROL: 121 mg/dL (ref 0–200)
HDL: 28.3 mg/dL — AB (ref >39.00)
LDL CALC: 61 mg/dL (ref 0–99)
NONHDL: 92.7
Total CHOL/HDL Ratio: 4
Triglycerides: 157 mg/dL — ABNORMAL HIGH (ref 0.0–149.0)
VLDL: 31.4 mg/dL (ref 0.0–40.0)

## 2015-05-25 LAB — CBC WITH DIFFERENTIAL/PLATELET
BASOS PCT: 0.6 % (ref 0.0–3.0)
Basophils Absolute: 0 10*3/uL (ref 0.0–0.1)
EOS PCT: 8.5 % — AB (ref 0.0–5.0)
Eosinophils Absolute: 0.6 10*3/uL (ref 0.0–0.7)
HCT: 41.6 % (ref 39.0–52.0)
HEMOGLOBIN: 14.3 g/dL (ref 13.0–17.0)
Lymphocytes Relative: 38.5 % (ref 12.0–46.0)
Lymphs Abs: 2.7 10*3/uL (ref 0.7–4.0)
MCHC: 34.3 g/dL (ref 30.0–36.0)
MCV: 85.9 fl (ref 78.0–100.0)
Monocytes Absolute: 0.6 10*3/uL (ref 0.1–1.0)
Monocytes Relative: 8.3 % (ref 3.0–12.0)
Neutro Abs: 3.1 10*3/uL (ref 1.4–7.7)
Neutrophils Relative %: 44.1 % (ref 43.0–77.0)
Platelets: 241 10*3/uL (ref 150.0–400.0)
RBC: 4.84 Mil/uL (ref 4.22–5.81)
RDW: 13.3 % (ref 11.5–15.5)
WBC: 7 10*3/uL (ref 4.0–10.5)

## 2015-05-25 LAB — COMPREHENSIVE METABOLIC PANEL
ALBUMIN: 4.1 g/dL (ref 3.5–5.2)
ALT: 14 U/L (ref 0–53)
AST: 27 U/L (ref 0–37)
Alkaline Phosphatase: 75 U/L (ref 39–117)
BUN: 11 mg/dL (ref 6–23)
CALCIUM: 9.2 mg/dL (ref 8.4–10.5)
CHLORIDE: 106 meq/L (ref 96–112)
CO2: 28 meq/L (ref 19–32)
CREATININE: 1.05 mg/dL (ref 0.40–1.50)
GFR: 105.51 mL/min (ref >60.00)
GLUCOSE: 86 mg/dL (ref 70–99)
Potassium: 4 mEq/L (ref 3.5–5.1)
Sodium: 140 mEq/L (ref 135–145)
Total Bilirubin: 0.4 mg/dL (ref 0.2–1.2)
Total Protein: 7.1 g/dL (ref 6.0–8.3)

## 2015-05-25 LAB — TSH: TSH: 0.35 u[IU]/mL (ref 0.35–4.50)

## 2015-05-25 NOTE — Patient Instructions (Signed)

## 2015-05-25 NOTE — Progress Notes (Signed)
Pre visit review using our clinic review tool, if applicable. No additional management support is needed unless otherwise documented below in the visit note. 

## 2015-05-26 NOTE — Progress Notes (Signed)
Subjective:  Patient ID: Shawn Nicholson, male    DOB: May 10, 1981  Age: 34 y.o. MRN: [id number]  CC: Annual Exam   HPI Shawn Nicholson presents for a CPX - he feels well and offers no complaints.  No outpatient prescriptions prior to visit.   No facility-administered medications prior to visit.    ROS Review of Systems  All other systems reviewed and are negative.   Objective:  BP 126/78 mmHg  Pulse 73  Temp(Src) 97.8 F (36.6 C) (Oral)  Resp 16  Ht  (1.981 m)  Wt 210 lb (95.255 kg)  BMI 24.27 kg/m2  SpO2 98%  BP Readings from Last 3 Encounters:  05/25/15 126/78  11/23/13 120/80  12/12/12 118/88    Wt Readings from Last 3 Encounters:  05/25/15 210 lb (95.255 kg)  11/23/13 193 lb (87.544 kg)  11/21/12 166 lb 4 oz (75.411 kg)    Physical Exam  Constitutional: He is oriented to person, place, and time. No distress.  HENT:  Mouth/Throat: Oropharynx is clear and moist. No oropharyngeal exudate.  Eyes: Conjunctivae are normal. Right eye exhibits no discharge. Left eye exhibits no discharge. No scleral icterus.  Neck: Normal range of motion. Neck supple. No JVD present. No tracheal deviation present. No thyromegaly present.  Cardiovascular: Normal rate, regular rhythm, normal heart sounds and intact distal pulses.  Exam reveals no gallop and no friction rub.   No murmur heard. Pulmonary/Chest: Effort normal and breath sounds normal. No stridor. No respiratory distress. He has no wheezes. He has no rales. He exhibits no tenderness.  Abdominal: Soft. Bowel sounds are normal. He exhibits no distension and no mass. There is no tenderness. There is no rebound and no guarding. Hernia confirmed negative in the right inguinal area and confirmed negative in the left inguinal area.  Genitourinary: Testes normal and penis normal. Right testis shows no mass, no swelling and no tenderness. Right testis is descended. Left testis shows no mass, no swelling and no tenderness.  Left testis is descended. Circumcised. No penile erythema or penile tenderness. No discharge found.  Musculoskeletal: Normal range of motion. He exhibits no edema or tenderness.  Lymphadenopathy:    He has no cervical adenopathy.       Right: No inguinal adenopathy present.       Left: No inguinal adenopathy present.  Neurological: He is oriented to person, place, and time.  Skin: Skin is warm and dry. No rash noted. He is not diaphoretic. No erythema. No pallor.  Psychiatric: He has a normal mood and affect. His behavior is normal. Judgment and thought content normal.  Vitals reviewed.   Lab Results  Component Value Date   WBC 7.0 05/25/2015   HGB 14.3 05/25/2015   HCT 41.6 05/25/2015   PLT 241.0 05/25/2015   GLUCOSE 86 05/25/2015   CHOL 121 05/25/2015   TRIG 157.0* 05/25/2015   HDL 28.30* 05/25/2015   LDLCALC 61 05/25/2015   ALT 14 05/25/2015   AST 27 05/25/2015   NA 140 05/25/2015   K 4.0 05/25/2015   CL 106 05/25/2015   CREATININE 1.05 05/25/2015   BUN 11 05/25/2015   CO2 28 05/25/2015   TSH 0.35 05/25/2015    No results found.  Assessment & Plan:   [name] was seen today for annual exam.  Diagnoses and all orders for this visit:  Routine general medical examination at a health care facility- exam done, labs ordered and reviewed, vaccines reviewed, patient education material given. Orders: -  Lipid panel; Future -     Comprehensive metabolic panel; Future -     CBC with Differential/Platelet; Future -     TSH; Future   [name] does not currently have medications on file.  No orders of the defined types were placed in this encounter.     Follow-up: Return if symptoms worsen or fail to improve.  Shawn Lingerhomas Izabellah Dadisman, MD

## 2017-12-03 ENCOUNTER — Emergency Department (HOSPITAL_COMMUNITY)
Admission: EM | Admit: 2017-12-03 | Discharge: 2017-12-05 | Disposition: A | Discharge status: Other Acute Inpatient Hospital | Payer: No Typology Code available for payment source | Attending: Emergency Medicine | Admitting: Emergency Medicine

## 2017-12-03 ENCOUNTER — Other Ambulatory Visit: Payer: Self-pay

## 2017-12-03 ENCOUNTER — Encounter (HOSPITAL_COMMUNITY): Payer: Self-pay | Admitting: Nurse Practitioner

## 2017-12-03 DIAGNOSIS — F23 Brief psychotic disorder: Secondary | ICD-10-CM | POA: Diagnosis not present

## 2017-12-03 DIAGNOSIS — Z87891 Personal history of nicotine dependence: Secondary | ICD-10-CM | POA: Insufficient documentation

## 2017-12-03 DIAGNOSIS — R440 Auditory hallucinations: Secondary | ICD-10-CM

## 2017-12-03 DIAGNOSIS — R462 Strange and inexplicable behavior: Secondary | ICD-10-CM | POA: Diagnosis not present

## 2017-12-03 DIAGNOSIS — Z046 Encounter for general psychiatric examination, requested by authority: Secondary | ICD-10-CM | POA: Diagnosis present

## 2017-12-03 LAB — RAPID URINE DRUG SCREEN, HOSP PERFORMED
Amphetamines: NOT DETECTED
Barbiturates: NOT DETECTED
Benzodiazepines: NOT DETECTED
Cocaine: NOT DETECTED
Opiates: NOT DETECTED
Tetrahydrocannabinol: POSITIVE — AB

## 2017-12-03 LAB — CBC WITH DIFFERENTIAL/PLATELET
BASOS PCT: 0 %
Basophils Absolute: 0 10*3/uL (ref 0.0–0.1)
EOS PCT: 0 %
Eosinophils Absolute: 0 10*3/uL (ref 0.0–0.7)
HCT: 42.2 % (ref 39.0–52.0)
HEMOGLOBIN: 15.7 g/dL (ref 13.0–17.0)
Lymphocytes Relative: 26 %
Lymphs Abs: 2.3 10*3/uL (ref 0.7–4.0)
MCH: 29.8 pg (ref 26.0–34.0)
MCHC: 37.2 g/dL — AB (ref 30.0–36.0)
MCV: 80.1 fL (ref 78.0–100.0)
MONOS PCT: 11 %
Monocytes Absolute: 1 10*3/uL (ref 0.1–1.0)
NEUTROS PCT: 63 %
Neutro Abs: 5.7 10*3/uL (ref 1.7–7.7)
PLATELETS: 274 10*3/uL (ref 150–400)
RBC: 5.27 MIL/uL (ref 4.22–5.81)
RDW: 13.7 % (ref 11.5–15.5)
WBC: 9.1 10*3/uL (ref 4.0–10.5)

## 2017-12-03 LAB — COMPREHENSIVE METABOLIC PANEL
ALK PHOS: 69 U/L (ref 38–126)
ALT: 23 U/L (ref 17–63)
ANION GAP: 11 (ref 5–15)
AST: 27 U/L (ref 15–41)
Albumin: 5.3 g/dL — ABNORMAL HIGH (ref 3.5–5.0)
BILIRUBIN TOTAL: 1 mg/dL (ref 0.3–1.2)
BUN: 9 mg/dL (ref 6–20)
CALCIUM: 10.2 mg/dL (ref 8.9–10.3)
CO2: 26 mmol/L (ref 22–32)
CREATININE: 0.84 mg/dL (ref 0.61–1.24)
Chloride: 100 mmol/L — ABNORMAL LOW (ref 101–111)
GFR calc non Af Amer: >60 mL/min (ref >60)
Glucose, Bld: 88 mg/dL (ref 65–99)
Potassium: 3.4 mmol/L — ABNORMAL LOW (ref 3.5–5.1)
Sodium: 137 mmol/L (ref 135–145)
Total Protein: 9.1 g/dL — ABNORMAL HIGH (ref 6.5–8.1)

## 2017-12-03 LAB — ETHANOL: Alcohol, Ethyl (B): <10 mg/dL (ref <10)

## 2017-12-03 MED ORDER — LORAZEPAM 2 MG/ML IJ SOLN
2.0000 mg | Freq: Once | INTRAMUSCULAR | Status: AC | PRN
Start: 2017-12-03 — End: 2017-12-03
  Administered 2017-12-03: 2 mg via INTRAMUSCULAR
  Filled 2017-12-03: qty 1

## 2017-12-03 MED ORDER — STERILE WATER FOR INJECTION IJ SOLN
INTRAMUSCULAR | Status: AC
  Administered 2017-12-03: 10 mL
  Filled 2017-12-03: qty 10

## 2017-12-03 MED ORDER — DIPHENHYDRAMINE HCL 50 MG/ML IJ SOLN
50.0000 mg | Freq: Once | INTRAMUSCULAR | Status: AC | PRN
  Administered 2017-12-03: 50 mg via INTRAMUSCULAR
  Filled 2017-12-03: qty 1

## 2017-12-03 MED ORDER — ZIPRASIDONE MESYLATE 20 MG IM SOLR
20.0000 mg | Freq: Once | INTRAMUSCULAR | Status: AC | PRN
  Administered 2017-12-03: 20 mg via INTRAMUSCULAR
  Filled 2017-12-03: qty 20

## 2017-12-03 MED ORDER — DIPHENHYDRAMINE HCL 50 MG/ML IJ SOLN
50.0000 mg | Freq: Once | INTRAMUSCULAR | Status: DC | PRN
Start: 2017-12-03 — End: 2017-12-03

## 2017-12-03 NOTE — ED Notes (Signed)
Pt allowed IM medications to be given. There was a show of support in case he became aggressive, but he stayed in bed and allowed the shots to be given in the right deltoid. He asked why, and when told to help his disorientation and help him rest he said, "ok".

## 2017-12-03 NOTE — ED Notes (Signed)
Pt A&O x 3, no distress noted, sleeping at present.  Pt sedated,  Monitoring for safety, Q 15 min checks in effect.

## 2017-12-03 NOTE — ED Notes (Signed)
Pt continues to wander the milieu and attempt to go into other rooms and the exits. He appears to be disoriented and responding to internal stimuli. He is not mean or aggressive at this time. He is mostly nonverbal with one-word comments.

## 2017-12-03 NOTE — ED Notes (Signed)
On admission to the [facility] pt appears to be disoriented and possibly responding to internal stimuli, as he talks to himself and repeats questions that have just been answered for him.  He answers questions with one-word answers. He walks near the doors trying them.

## 2017-12-03 NOTE — BH Assessment (Signed)
[facility] Assessment Progress Note  Clinician attempted to assess pt but he has been sedated and is unable to be assessed at this time.   Johny ShockSamantha M. Ladona Ridgelaylor, MS, NCC, LPCA Counselor

## 2017-12-03 NOTE — ED Notes (Signed)
Report given to [facility] RN 

## 2017-12-03 NOTE — ED Triage Notes (Signed)
Patient was brought in by [facility] he was IVC by family due to hearing voices and being confused since his grandfather passed away.
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
## 2017-12-03 NOTE — ED Notes (Signed)
Pt currently in bed resting with eyes closed. Rise and fall of chest visible.

## 2017-12-04 MED ORDER — HYDROXYZINE HCL 25 MG PO TABS
25.0000 mg | ORAL_TABLET | Freq: Three times a day (TID) | ORAL | Status: DC | PRN
  Administered 2017-12-04: 25 mg via ORAL
  Filled 2017-12-04: qty 1

## 2017-12-04 MED ORDER — ESCITALOPRAM OXALATE 10 MG PO TABS
10.0000 mg | ORAL_TABLET | Freq: Every day | ORAL | Status: DC
  Administered 2017-12-04 – 2017-12-05 (×2): 10 mg via ORAL
  Filled 2017-12-04 (×2): qty 1

## 2017-12-04 MED ORDER — RISPERIDONE 1 MG PO TABS
1.0000 mg | ORAL_TABLET | Freq: Two times a day (BID) | ORAL | Status: DC
  Administered 2017-12-04 – 2017-12-05 (×3): 1 mg via ORAL
  Filled 2017-12-04 (×3): qty 1
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
## 2017-12-04 NOTE — ED Notes (Signed)
Patient is calm, cooperative at this time.  Denies hearing voices today.  He is not aware that he has a psychiatric condition.  Reports he uses maraijuana to relax and improve his appetite.  Denies SI, HI, or AVH.  Patient appears to have a slowed response.

## 2017-12-04 NOTE — [facility] Counselor (Signed)
Disposition   [facility], Donell SievertSpencer Simon, PA who recommends that pt is observed for stability and safety and re-evaluated by psychiatry in the AM.  LPCA informed pt's ER provider, Dr Rhunette CroftNanavati, MD and pt's nurse, Joanie CoddingtonLatricia, RN of the PA's recommended disposition.  Autym Siess L. Hokulani Rogel, MS, LPCA, [facility] Therapeutic Triage Specialist  [phone]

## 2017-12-04 NOTE — ED Notes (Signed)
Pt A&O x 3, sleeping at present, no distress noted, calm & cooperative.  Monitoring for safety, Q 15 min checks in effect. 

## 2017-12-05 DIAGNOSIS — Z87891 Personal history of nicotine dependence: Secondary | ICD-10-CM

## 2017-12-05 DIAGNOSIS — F23 Brief psychotic disorder: Secondary | ICD-10-CM | POA: Diagnosis not present

## 2017-12-05 NOTE — ED Notes (Signed)
[facility]  Pt was calm and cooperative.  All belongings were sent with patient including overnight bag brought in by family.  Pt was in no distress.

## 2017-12-05 NOTE — Consult Note (Signed)
[facility] Consult   Reason for Consult:  Psychosis  Referring Physician:  EDP Patient Identification: Shawn Nicholson MRN:  [id number] Principal Diagnosis: Acute psychosis [facility]) Diagnosis:   Patient Active Problem List   Diagnosis Date Noted  . Acute psychosis ([place]) [F23] 12/05/2017    Priority: High  . Routine general medical examination at a health care facility [Z00.00] 11/21/2012  . Allergic rhinitis due to other allergen [J30.89] 06/01/2009    Total Time spent with patient: 45 minutes  Subjective:   Shawn Nicholson is a 37 y.o. male patient admitted with psychosis.  HPI:  37 yo male who presents with hearing voices and confusion since his grandfather passed away, 2 months ago.  Denies suicidal/homicidal ideations.  Does use cannabis.  Thought processes are slow and confused at times.  He feels like someone is taking his thoughts.  Mother is at his bedside and supportive.  Inpatient hospitalization recommended.  Past Psychiatric History: none  Risk to Self: Suicidal Ideation: No(Pt denies) Suicidal Intent: No Is patient at risk for suicide?: No Suicidal Plan?: No Access to Means: No What has been your use of drugs/alcohol within the last 12 months?: Alcohol and marijuana Triggers for Past Attempts: None known Intentional Self Injurious Behavior: None Risk to Others: Homicidal Ideation: No(pt denies) Thoughts of Harm to Others: No Current Homicidal Intent: No Current Homicidal Plan: No Access to Homicidal Means: No History of harm to others?: No Assessment of Violence: None Noted Does patient have access to weapons?: No Criminal Charges Pending?: No Does patient have a court date: No Prior Inpatient Therapy: Prior Inpatient Therapy: No Prior Outpatient Therapy: Prior Outpatient Therapy: Yes Prior Therapy Dates: (pt reports a few years ago) Prior Therapy Facilty/Provider(s): Hospice(grief counseling) Reason for Treatment: Grandfather died Does  patient have an ACCT team?: No Does patient have Intensive In-House Services?  : No Does patient have [facility] services? : No Does patient have P4CC services?: No  Past Medical History:  Past Medical History:  Diagnosis Date  . Allergy   . Anxiety    no meds  . Boxer's fracture    RLF  . Depression     Past Surgical History:  Procedure Laterality Date  . CLOSED REDUCTION METACARPAL WITH PERCUTANEOUS PINNING  10/23/2012   Procedure: CLOSED REDUCTION METACARPAL WITH PERCUTANEOUS PINNING;  Surgeon: Tennis Must, MD;  Location: [place];  Service: Orthopedics;  Laterality: Right;  . OPEN REDUCTION INTERNAL FIXATION (ORIF) METACARPAL  10/23/2012   Procedure: OPEN REDUCTION INTERNAL FIXATION (ORIF) METACARPAL;  Surgeon: Tennis Must, MD;  Location: [place];  Service: Orthopedics;;  RIGHT SMALL AND RING FINGER ORIF VERSUS CLOSED REDUCTION PERCUTANEOUS PINNING   Family History:  Family History  Problem Relation Age of Onset  . Hypertension Father   . Arthritis Mother   . Arthritis Other   . Hypertension Other   . Cancer Neg Hx   . Alcohol abuse Neg Hx   . Depression Neg Hx   . Diabetes Neg Hx   . Drug abuse Neg Hx   . Early death Neg Hx   . Heart disease Neg Hx   . Hyperlipidemia Neg Hx   . Kidney disease Neg Hx   . Stroke Neg Hx    Family Psychiatric  History: none Social History:  Social History   Substance and Sexual Activity  Alcohol Use Yes  . Alcohol/week: 1.8 oz  . Types: 3 Cans of beer per week   Comment:  social     Social History   Substance and Sexual Activity  Drug Use Yes  . Types: Marijuana    Social History   Socioeconomic History  . Marital status: Single    Spouse name: None  . Number of children: None  . Years of education: None  . Highest education level: None  Social Needs  . Financial resource strain: None  . Food insecurity - worry: None  . Food insecurity - inability: None  . Transportation needs -  medical: None  . Transportation needs - non-medical: None  Occupational History  . Occupation: Airline pilot: HOSP[employer]  Tobacco Use  . Smoking status: Former Smoker    Last attempt to quit: 10/24/2010    Years since quitting: 7.1  . Smokeless tobacco: Never Used  Substance and Sexual Activity  . Alcohol use: Yes    Alcohol/week: 1.8 oz    Types: 3 Cans of beer per week    Comment: social  . Drug use: Yes    Types: Marijuana  . Sexual activity: Yes  Other Topics Concern  . None  Social History Narrative  . None   Additional Social History: N/A    Allergies:   Allergies  Allergen Reactions  . Other Other (See Comments)    Reaction to pecans showed up on allergy test  . Penicillins Rash    Labs:  Results for orders placed or performed during the hospital encounter of 12/03/17 (from the past 48 hour(s))  Rapid urine drug screen (hospital performed)     Status: Abnormal   Collection Time: 12/03/17  3:41 PM  Result Value Ref Range   Opiates NONE DETECTED NONE DETECTED   Cocaine NONE DETECTED NONE DETECTED   Benzodiazepines NONE DETECTED NONE DETECTED   Amphetamines NONE DETECTED NONE DETECTED   Tetrahydrocannabinol POSITIVE (A) NONE DETECTED   Barbiturates NONE DETECTED NONE DETECTED    Comment: (NOTE) DRUG SCREEN FOR MEDICAL PURPOSES ONLY.  IF CONFIRMATION IS NEEDED FOR ANY PURPOSE, NOTIFY LAB WITHIN 5 DAYS. LOWEST DETECTABLE LIMITS FOR URINE DRUG SCREEN Drug Class                     Cutoff (ng/mL) Amphetamine and metabolites    1000 Barbiturate and metabolites    200 Benzodiazepine                 643 Tricyclics and metabolites     300 Opiates and metabolites        300 Cocaine and metabolites        300 THC                            50   Comprehensive metabolic panel     Status: Abnormal   Collection Time: 12/03/17  5:00 PM  Result Value Ref Range   Sodium 137 135 - 145 mmol/L   Potassium 3.4 (L) 3.5 - 5.1 mmol/L   Chloride 100 (L) 101 - 111 mmol/L    CO2 26 22 - 32 mmol/L   Glucose, Bld 88 65 - 99 mg/dL   BUN 9 6 - 20 mg/dL   Creatinine, Ser 0.84 0.61 - 1.24 mg/dL   Calcium 10.2 8.9 - 10.3 mg/dL   Total Protein 9.1 (H) 6.5 - 8.1 g/dL   Albumin 5.3 (H) 3.5 - 5.0 g/dL   AST 27 15 - 41 U/L   ALT 23 17 - 63 U/L  Alkaline Phosphatase 69 38 - 126 U/L   Total Bilirubin 1.0 0.3 - 1.2 mg/dL   GFR calc non Af Amer >60 >60 mL/min   GFR calc Af Amer >60 >60 mL/min    Comment: (NOTE) The eGFR has been calculated using the CKD EPI equation. This calculation has not been validated in all clinical situations. eGFR's persistently <60 mL/min signify possible Chronic Kidney Disease.    Anion gap 11 5 - 15  Ethanol     Status: None   Collection Time: 12/03/17  5:00 PM  Result Value Ref Range   Alcohol, Ethyl (B) <10 <10 mg/dL    Comment:        LOWEST DETECTABLE LIMIT FOR SERUM ALCOHOL IS 10 mg/dL FOR MEDICAL PURPOSES ONLY   CBC with Diff     Status: Abnormal   Collection Time: 12/03/17  5:00 PM  Result Value Ref Range   WBC 9.1 4.0 - 10.5 K/uL   RBC 5.27 4.22 - 5.81 MIL/uL   Hemoglobin 15.7 13.0 - 17.0 g/dL   HCT 42.2 39.0 - 52.0 %   MCV 80.1 78.0 - 100.0 fL   MCH 29.8 26.0 - 34.0 pg   MCHC 37.2 (H) 30.0 - 36.0 g/dL    Comment: RULED OUT INTERFERING SUBSTANCES   RDW 13.7 11.5 - 15.5 %   Platelets 274 150 - 400 K/uL   Neutrophils Relative % 63 %   Neutro Abs 5.7 1.7 - 7.7 K/uL   Lymphocytes Relative 26 %   Lymphs Abs 2.3 0.7 - 4.0 K/uL   Monocytes Relative 11 %   Monocytes Absolute 1.0 0.1 - 1.0 K/uL   Eosinophils Relative 0 %   Eosinophils Absolute 0.0 0.0 - 0.7 K/uL   Basophils Relative 0 %   Basophils Absolute 0.0 0.0 - 0.1 K/uL    Current Facility-Administered Medications  Medication Dose Route Frequency Provider Last Rate Last Dose  . escitalopram (LEXAPRO) tablet 10 mg  10 mg Oral Daily Buford Dresser J, DO   10 mg at 12/05/17 1107  . hydrOXYzine (ATARAX/VISTARIL) tablet 25 mg  25 mg Oral TID PRN Faythe Dingwall, DO   25 mg at 12/04/17 2114  . risperiDONE (RISPERDAL) tablet 1 mg  1 mg Oral BID Faythe Dingwall, DO   1 mg at 12/05/17 1107   No current outpatient medications on file.    Musculoskeletal: Strength & Muscle Tone: within normal limits Gait & Station: normal Patient leans: N/A  Psychiatric Specialty Exam: Physical Exam  Nursing note and vitals reviewed. Constitutional: He is oriented to person, place, and time. He appears well-developed and well-nourished.  HENT:  Head: Normocephalic.  Neck: Normal range of motion.  Respiratory: Effort normal.  Musculoskeletal: Normal range of motion.  Neurological: He is alert and oriented to person, place, and time.  Skin: No rash noted.  Psychiatric: His speech is normal. Thought content normal. His mood appears anxious. He is actively hallucinating. Cognition and memory are impaired. He expresses impulsivity.    Review of Systems  Psychiatric/Behavioral: Positive for hallucinations.  All other systems reviewed and are negative.   Blood pressure 131/68, pulse 83, temperature 98.7 F (37.1 C), temperature source Oral, resp. rate 18, height 6' 5"  (1.956 m), weight 88.5 kg (195 lb), SpO2 99 %.Body mass index is 23.12 kg/m.  General Appearance: Casual  Eye Contact:  Fair  Speech:  Normal Rate  Volume:  Normal  Mood:  Anxious at times  Affect:  Blunt  Thought Process:  Coherent and Descriptions of Associations: Intact  Orientation:  Full (Time, Place, and Person)  Thought Content:  Hallucinations: Auditory  Suicidal Thoughts:  No  Homicidal Thoughts:  No  Memory:  Immediate;   Fair Recent;   Fair Remote;   Fair  Judgement:  Impaired  Insight:  Fair  Psychomotor Activity:  Decreased  Concentration:  Concentration: Fair and Attention Span: Fair  Recall:  [employer] of Knowledge:  Fair  Language:  Good  Akathisia:  No  Handed:  Right  AIMS (if indicated):    N/A  Assets:  Housing Leisure Time Physical  Health Resilience Social Support  ADL's:  Intact  Cognition:  Impaired,  Mild  Sleep:    N/A     Treatment Plan Summary: Daily contact with patient to assess and evaluate symptoms and progress in treatment, Medication management and Plan acute psychosis:  -Crisis stabilization -Medication management:  Started Risperdal 1 mg BID for psychosis, Lexapro 10 mg daily for depression, and Vistaril 25 mg TID PRN anxiety -Individual counseling  Disposition: Recommend psychiatric Inpatient admission when medically cleared.  Waylan Boga, NP 12/05/2017 1:53 PM   Patient seen face-to-face for psychiatric evaluation, chart reviewed and case discussed with the physician extender and developed treatment plan. Reviewed the information documented and agree with the treatment plan.  Buford Dresser, DO

## 2017-12-05 NOTE — BH Assessment (Signed)
[facility]  Per Juanetta BeetsJacqueline Norman, DO, this pt requires psychiatric hospitalization at this time.  Pt presents under IVC initiated by pt's mother, which EDP Drema PryPedro Cardama, MD has upheld.  At 13:41 Boneta LucksJenny [facility] to report that pt has been accepted to their facility by Dr. Estill Cottahomas [place].  Dr Sharma CovertNorman concurs with this decision.  Pt's nurse, Kendal Hymendie, has been notified, and agrees to call report to [phone].  [facility]  Doylene Canninghomas Marquest Gunkel Behavioral Health Coordinator [phone]

## 2018-11-13 ENCOUNTER — Encounter (HOSPITAL_COMMUNITY): Payer: Self-pay

## 2018-11-13 ENCOUNTER — Other Ambulatory Visit: Payer: Self-pay

## 2018-11-13 ENCOUNTER — Other Ambulatory Visit: Payer: Self-pay | Admitting: Registered Nurse

## 2018-11-13 ENCOUNTER — Inpatient Hospital Stay (HOSPITAL_COMMUNITY)
Admission: RE | Admit: 2018-11-13 | Discharge: 2018-11-19 | DRG: 885 | Disposition: A | Discharge status: Home / Self Care | Payer: Federal, State, Local not specified - Other | Attending: Psychiatry | Admitting: Psychiatry

## 2018-11-13 DIAGNOSIS — F209 Schizophrenia, unspecified: Secondary | ICD-10-CM | POA: Diagnosis present

## 2018-11-13 DIAGNOSIS — F17210 Nicotine dependence, cigarettes, uncomplicated: Secondary | ICD-10-CM | POA: Diagnosis present

## 2018-11-13 DIAGNOSIS — F1220 Cannabis dependence, uncomplicated: Secondary | ICD-10-CM | POA: Diagnosis present

## 2018-11-13 DIAGNOSIS — F250 Schizoaffective disorder, bipolar type: Secondary | ICD-10-CM | POA: Diagnosis not present

## 2018-11-13 DIAGNOSIS — F200 Paranoid schizophrenia: Principal | ICD-10-CM | POA: Diagnosis present

## 2018-11-13 DIAGNOSIS — G4700 Insomnia, unspecified: Secondary | ICD-10-CM | POA: Diagnosis present

## 2018-11-13 DIAGNOSIS — E785 Hyperlipidemia, unspecified: Secondary | ICD-10-CM | POA: Diagnosis present

## 2018-11-13 DIAGNOSIS — F419 Anxiety disorder, unspecified: Secondary | ICD-10-CM | POA: Diagnosis present

## 2018-11-13 DIAGNOSIS — Z9114 Patient's other noncompliance with medication regimen: Secondary | ICD-10-CM

## 2018-11-13 DIAGNOSIS — E876 Hypokalemia: Secondary | ICD-10-CM | POA: Diagnosis present

## 2018-11-13 LAB — COMPREHENSIVE METABOLIC PANEL
ALT: 15 U/L (ref 0–44)
AST: 22 U/L (ref 15–41)
Albumin: 5.1 g/dL — ABNORMAL HIGH (ref 3.5–5.0)
Alkaline Phosphatase: 65 U/L (ref 38–126)
Anion gap: 12 (ref 5–15)
BILIRUBIN TOTAL: 1.2 mg/dL (ref 0.3–1.2)
BUN: 15 mg/dL (ref 6–20)
CO2: 27 mmol/L (ref 22–32)
CREATININE: 0.94 mg/dL (ref 0.61–1.24)
Calcium: 10 mg/dL (ref 8.9–10.3)
Chloride: 100 mmol/L (ref 98–111)
GFR calc Af Amer: >60 mL/min (ref >60)
GFR calc non Af Amer: >60 mL/min (ref >60)
Glucose, Bld: 101 mg/dL — ABNORMAL HIGH (ref 70–99)
Potassium: 3.3 mmol/L — ABNORMAL LOW (ref 3.5–5.1)
Sodium: 139 mmol/L (ref 135–145)
TOTAL PROTEIN: 8.7 g/dL — AB (ref 6.5–8.1)

## 2018-11-13 LAB — LIPID PANEL
Cholesterol: 155 mg/dL (ref 0–200)
HDL: 36 mg/dL — ABNORMAL LOW (ref >40)
LDL Cholesterol: 112 mg/dL — ABNORMAL HIGH (ref 0–99)
Total CHOL/HDL Ratio: 4.3 RATIO
Triglycerides: 34 mg/dL (ref <150)
VLDL: 7 mg/dL (ref 0–40)

## 2018-11-13 LAB — CBC
HCT: 45 % (ref 39.0–52.0)
Hemoglobin: 16.1 g/dL (ref 13.0–17.0)
MCH: 28.3 pg (ref 26.0–34.0)
MCHC: 35.8 g/dL (ref 30.0–36.0)
MCV: 79.1 fL — AB (ref 80.0–100.0)
Platelets: 305 10*3/uL (ref 150–400)
RBC: 5.69 MIL/uL (ref 4.22–5.81)
RDW: 13.4 % (ref 11.5–15.5)
WBC: 6.1 10*3/uL (ref 4.0–10.5)
nRBC: 0 % (ref 0.0–0.2)

## 2018-11-13 LAB — HEMOGLOBIN A1C
Hgb A1c MFr Bld: 5 % (ref 4.8–5.6)
Mean Plasma Glucose: 96.8 mg/dL

## 2018-11-13 LAB — TSH: TSH: 0.387 u[IU]/mL (ref 0.350–4.500)

## 2018-11-13 LAB — ETHANOL: Alcohol, Ethyl (B): <10 mg/dL (ref <10)

## 2018-11-13 MED ORDER — ACETAMINOPHEN 325 MG PO TABS: 650.0000 mg | ORAL_TABLET | Freq: Four times a day (QID) | ORAL | Status: DC | PRN

## 2018-11-13 MED ORDER — HYDROXYZINE HCL 50 MG PO TABS
50.0000 mg | ORAL_TABLET | Freq: Four times a day (QID) | ORAL | Status: DC | PRN
  Filled 2018-11-13: qty 10

## 2018-11-13 MED ORDER — TRAZODONE HCL 100 MG PO TABS
100.0000 mg | ORAL_TABLET | Freq: Every evening | ORAL | Status: DC | PRN
  Filled 2018-11-13 (×5): qty 1
  Filled 2018-11-13: qty 14
  Filled 2018-11-13 (×4): qty 1
  Filled 2018-11-13: qty 14
  Filled 2018-11-13 (×4): qty 1

## 2018-11-13 NOTE — H&P (Signed)
Behavioral Health Medical Screening Exam  Shawn Nicholson is an 38 y.o. male patient presents to [facility] as walk in accompanied by his mother with complaints of odd behavior.  Patient states he has a lot of energy and is trying to put life back together; speech/delayed thought process,  Paranoia.  Mother of patient states hospitalized last year at [facility] and diagnosed with schizophrenia; is suppose to be taking medication but hasn't.  States she has concerns because he lives alone and has been driving and is afraid he will hurt himself or someone else.  States that behavior started to change after the death of his grandfather 07/06/18; but has worsened over the last few days.  Patient denise suicidal/homicidal ideation; states he does have paranoia but would not elaborate; Would not answer the question related to hearing or seeing things that others couldn't/hallucinations.    Total Time spent with patient: 30 minutes  Psychiatric Specialty Exam: Physical Exam  Vitals reviewed. Constitutional: He is oriented to person, place, and time. He appears well-developed and well-nourished. No distress.  Neck: Normal range of motion. Neck supple.  Respiratory: Effort normal.  Musculoskeletal: Normal range of motion.  Neurological: He is alert and oriented to person, place, and time.  Skin: Skin is warm and dry.  Psychiatric: His mood appears anxious. His speech is delayed. He is actively hallucinating. Thought content is paranoid. He expresses no homicidal and no suicidal ideation.    Review of Systems  Psychiatric/Behavioral: Positive for hallucinations (Denies; but appears to be responding to internal stimuli) and substance abuse (THC daily). Negative for depression and suicidal ideas. The patient is nervous/anxious.   All other systems reviewed and are negative.   Blood pressure 137/76, pulse 89, temperature 99.1 F (37.3 C), resp. rate 18, SpO2 90 %.There is no height or weight on file to  calculate BMI.  General Appearance: Casual  Eye Contact:  Good  Speech:  Clear and Coherent, Slow and delay  Volume:  Decreased  Mood:  confusion  Affect:  Labile  Thought Process:  Coherent and Disorganized  Orientation:  Full (Time, Place, and Person)  Thought Content:  Hallucinations: Auditory and Paranoid Ideation  Suicidal Thoughts:  No  Homicidal Thoughts:  No  Memory:  Immediate;   Fair Recent;   Fair Remote;   Fair  Judgement:  Impaired  Insight:  Fair  Psychomotor Activity:  Normal  Concentration: Concentration: Fair and Attention Span: Fair  Recall:  [employer] of Knowledge:Fair  Language: Good  Akathisia:  No  Handed:  Right  AIMS (if indicated):     Assets:  Desire for Improvement Housing Physical Health Social Support  Sleep:       Musculoskeletal: Strength & Muscle Tone: within normal limits Gait & Station: normal Patient leans: N/A  Blood pressure 137/76, pulse 89, temperature 99.1 F (37.3 C), resp. rate 18, SpO2 90 %.  Recommendations:  Inpatient psychiatric treatment  Based on my evaluation the patient does not appear to have an emergency medical condition.  Shuvon Rankin, NP 11/13/2018, 3:29 PM

## 2018-11-13 NOTE — Progress Notes (Signed)
Did not attend group 

## 2018-11-13 NOTE — Tx Team (Signed)
Initial Treatment Plan 11/13/2018 6:32 PM Yuriel M Nation IEP:[id number]    PATIENT STRESSORS: Medication change or noncompliance Substance abuse   PATIENT STRENGTHS: Ability for insight Capable of independent living Motivation for treatment/growth   PATIENT IDENTIFIED PROBLEMS: "I don't feel good."  Depression  Anxiety  Psychosis               DISCHARGE CRITERIA:  Improved stabilization in mood, thinking, and/or behavior Medical problems require only outpatient monitoring Motivation to continue treatment in a less acute level of care  PRELIMINARY DISCHARGE PLAN: Placement in alternative living arrangements Return to previous living arrangement  PATIENT/FAMILY INVOLVEMENT: This treatment plan has been presented to and reviewed with the patient, DESHUN URLACHER.  The patient and family have been given the opportunity to ask questions and make suggestions.  Dewayne Shorter, RN 11/13/2018, 6:32 PM

## 2018-11-13 NOTE — BH Assessment (Addendum)
Assessment Note  Shawn Nicholson is an 38 y.o. male who presented to [facility] as a walk-in with his mother.  Patient has a diagnosis of schizophrenia and has not been on medications in a couple of years, but states that he has been smoking marijuana daily. When asked specifics of his use, he states that he does not use much, but then stated that he uses marijuana daily, but then refused to discuss his use. Patient states that he is currently fasting for unknown reasons and states that he has not eaten in three days because he is doing "energy work."  Patient states that he is paranoid about what is going on around him and in the city.  When asked to explain what he means by that, he could not answer and just looked at this writer with a fixed stare and appeared to be responding to internal stimuli. Patient states that he has a lot on his plate right now and he states that he is trying to put the pieces together.  When asked what pieces that he needed to put together, he stated that "puzzle of my life."  Patient states that he has not been sleeping well.  He denies SI/HI and states that he has never tried to hurt himself or others.  He denies any history of abuse or self-mutilation.  Patient has been arrested on one occasion during a psychotic episode and was charged with burglary and trespassing, but the charges were reduced to trespassing because of his psychotic state when the crime occurred.  Patient was experiencing thought blocking and appeared to be responding to internal stimuli during his assessment process.  Patient had a fix stare at times and laughed and smiled inappropriately.  His mood was depressed and his affect was flat and blunted.  Patient's judgment, insight and impulse control appeared to be impaired.  His speech was soft and slow.  His thoughts were disorganized and his memory was impaired.  Patient presented as confused and disoriented at times and had questional ability to make rational  decisions for himself.    Diagnosis: F20.9 Schizophrenia, Undifferentiated  Past Medical History:  Past Medical History:  Diagnosis Date  . Allergy   . Anxiety    no meds  . Boxer's fracture    RLF  . Depression     Past Surgical History:  Procedure Laterality Date  . CLOSED REDUCTION METACARPAL WITH PERCUTANEOUS PINNING  10/23/2012   Procedure: CLOSED REDUCTION METACARPAL WITH PERCUTANEOUS PINNING;  Surgeon: Tami Ribas, MD;  Location: [facility];  Service: Orthopedics;  Laterality: Right;  . OPEN REDUCTION INTERNAL FIXATION (ORIF) METACARPAL  10/23/2012   Procedure: OPEN REDUCTION INTERNAL FIXATION (ORIF) METACARPAL;  Surgeon: Tami Ribas, MD;  Location: [facility];  Service: Orthopedics;;  RIGHT SMALL AND RING FINGER ORIF VERSUS CLOSED REDUCTION PERCUTANEOUS PINNING    Family History:  Family History  Problem Relation Age of Onset  . Hypertension Father   . Arthritis Mother   . Arthritis Other   . Hypertension Other   . Cancer Neg Hx   . Alcohol abuse Neg Hx   . Depression Neg Hx   . Diabetes Neg Hx   . Drug abuse Neg Hx   . Early death Neg Hx   . Heart disease Neg Hx   . Hyperlipidemia Neg Hx   . Kidney disease Neg Hx   . Stroke Neg Hx     Social History:  reports that he quit  smoking about 8 years ago. He has never used smokeless tobacco. He reports current alcohol use of about 3.0 standard drinks of alcohol per week. He reports current drug use. Drug: Marijuana.  Additional Social History:  Alcohol / Drug Use Pain Medications: see MAR Prescriptions: see MAR Over the Counter: see MAR History of alcohol / drug use?: Yes Longest period of sobriety (when/how long): none reported Substance #1 Name of Substance 1: Marijuana(pt stated that he really did not want to talk about his use) 1 - Age of First Use: unable to assess due to patient's paranoia 1 - Amount (size/oz): unable to assess due to patient's paranoia 1 - Frequency:  daily 1 - Duration: unable to assess due to patient's paranoia 1 - Last Use / Amount: unable to assess due to patient's paranoia  CIWA: CIWA-Ar BP: 137/76 Pulse Rate: 89 COWS:    Allergies:  Allergies  Allergen Reactions  . Other Other (See Comments)    Reaction to pecans showed up on allergy test  . Penicillins Rash    Home Medications:  No medications prior to admission.    OB/GYN Status:  No LMP for male patient.  General Assessment Data Location of Assessment: [facility] Assessment Services TTS Assessment: In system Is this a Tele or Face-to-Face Assessment?: Face-to-Face Is this an Initial Assessment or a Re-assessment for this encounter?: Initial Assessment Patient Accompanied by:: Parent Language Other than English: No Living Arrangements: Other (Comment)(lives alone in his oen apartment) What gender do you identify as?: Male Marital status: Single Living Arrangements: Alone Can pt return to current living arrangement?: Yes Admission Status: Voluntary Is patient capable of signing voluntary admission?: Yes Referral Source: Self/Family/Friend Insurance type: [facility]     Crisis Care Plan Living Arrangements: Alone Legal Guardian: Other:(self) Name of Psychiatrist: none Name of Therapist: none  Education Status Is patient currently in school?: No Is the patient employed, unemployed or receiving disability?: Unemployed  Risk to self with the past 6 months Suicidal Ideation: No Has patient been a risk to self within the past 6 months prior to admission? : No Suicidal Intent: No Has patient had any suicidal intent within the past 6 months prior to admission? : No Is patient at risk for suicide?: No Suicidal Plan?: No Has patient had any suicidal plan within the past 6 months prior to admission? : No Access to Means: No What has been your use of drugs/alcohol within the last 12 months?: none Previous Attempts/Gestures: No How many times?: (0) Other Self Harm  Risks: (patient is acutely psychotic) Triggers for Past Attempts: None known Family Suicide History: No Recent stressful life event(s): Other (Comment)(none reported) Persecutory voices/beliefs?: No Depression: Yes Depression Symptoms: Insomnia, Isolating, Loss of interest in usual pleasures Substance abuse history and/or treatment for substance abuse?: Yes Suicide prevention information given to non-admitted patients: Not applicable  Risk to Others within the past 6 months Homicidal Ideation: No Does patient have any lifetime risk of violence toward others beyond the six months prior to admission? : No Thoughts of Harm to Others: No Current Homicidal Intent: No Current Homicidal Plan: No Access to Homicidal Means: No Identified Victim: none History of harm to others?: No Assessment of Violence: None Noted Violent Behavior Description: none Does patient have access to weapons?: No Criminal Charges Pending?: No Does patient have a court date: No Is patient on probation?: No  Psychosis Hallucinations: Auditory(pt appears to be responding to internal stimuli) Delusions: None noted  Mental Status Report Appearance/Hygiene: Disheveled Eye Contact:  Good(fixed stare at times) Motor Activity: Unremarkable Speech: Soft, Slow Level of Consciousness: Alert Mood: Depressed, Suspicious Affect: Blunted, Flat Anxiety Level: None Thought Processes: Flight of Ideas, Thought Blocking Judgement: Impaired Orientation: Person, Place, Time  Cognitive Functioning Concentration: Decreased Memory: Recent Impaired, Remote Impaired Is patient IDD: No Insight: Poor Impulse Control: Poor Appetite: Poor Have you had any weight changes? : Loss Amount of the weight change? (lbs): (unknown, has fasted for three days) Sleep: Decreased Total Hours of Sleep: (not sleeping) Vegetative Symptoms: Decreased grooming  ADLScreening [facility] Assessment Services) Patient's cognitive ability adequate to  safely complete daily activities?: Yes Patient able to express need for assistance with ADLs?: Yes Independently performs ADLs?: Yes (appropriate for developmental age)  Prior Inpatient Therapy Prior Inpatient Therapy: Yes Prior Therapy Dates: 1-2 years ago [facility] Reason for Treatment: schizophrenc/medication management  Prior Outpatient Therapy Prior Outpatient Therapy: No Does patient have an ACCT team?: No Does patient have Intensive In-House Services?  : No Does patient have [facility] services? : Yes Does patient have P4CC services?: No  ADL Screening (condition at time of admission) Patient's cognitive ability adequate to safely complete daily activities?: Yes Is the patient deaf or have difficulty hearing?: No Does the patient have difficulty seeing, even when wearing glasses/contacts?: No Does the patient have difficulty concentrating, remembering, or making decisions?: No Patient able to express need for assistance with ADLs?: Yes Does the patient have difficulty dressing or bathing?: No Independently performs ADLs?: Yes (appropriate for developmental age) Does the patient have difficulty walking or climbing stairs?: No Weakness of Legs: None Weakness of Arms/Hands: None  Home Assistive Devices/Equipment Home Assistive Devices/Equipment: None  Therapy Consults (therapy consults require a physician order) PT Evaluation Needed: No OT Evalulation Needed: No SLP Evaluation Needed: No Abuse/Neglect Assessment (Assessment to be complete while patient is alone) Abuse/Neglect Assessment Can Be Completed: Yes Physical Abuse: Denies Verbal Abuse: Denies Sexual Abuse: Denies Exploitation of patient/patient's resources: Denies Self-Neglect: Yes, present (Comment)(pt has been fasting for 3 days for some unknown reason) Values / Beliefs Cultural Requests During Hospitalization: None Spiritual Requests During Hospitalization:  None Consults Spiritual Care Consult Needed: No Social Work Consult Needed: No Merchant navy officerAdvance Directives (For Healthcare) Does Patient Have a Medical Advance Directive?: No Would patient like information on creating a medical advance directive?: No - Patient declined Nutrition Screen- [facility] Adult/[facility]/AP Has the patient recently lost weight without trying?: Yes, 2-13 lbs.(patient has been fasting for three days) Has the patient been eating poorly because of a decreased appetite?: No Malnutrition Screening Tool Score: 1        Disposition: Per Shuvon Rankin, NP, Inpatient Treatment is Recommended and [facility] has accepted him for admission  Disposition Initial Assessment Completed for this Encounter: Yes Disposition of Patient: Admit Type of inpatient treatment program: Adult  On Site Evaluation by:   Reviewed with Physician:    Arnoldo Lenisanny J Lorena Benham 11/13/2018 3:59 PM

## 2018-11-13 NOTE — Progress Notes (Signed)
Patient ID: Shawn Nicholson, male   DOB: 13-Mar-1981, 38 y.o.   MRN: [id number]  Patient presented to [facility] due to psychosis, hallucinations, and delusions. He lives by himself and is basically self employed. His ex-girlfriend recently broke up with him, and around the same time, his grandfather passed away. Patient's mother was present during the assessment and was helpful for getting collateral information. Patient's mother said he is usually self sufficient, and has been admitted to [facility] in the past, where he was noncompliant after discharge. Patient's mother said patient is motivated for treatment this time and is willing to take medications. Skin assessment was complete and found unremarkable other than several tattoos throughout his body. Patient is currently denying any suicidal ideation. Safety is maintained with 15 minute checks. Will continue to monitor.

## 2018-11-14 DIAGNOSIS — F200 Paranoid schizophrenia: Principal | ICD-10-CM

## 2018-11-14 MED ORDER — BENZTROPINE MESYLATE 0.5 MG PO TABS
0.5000 mg | ORAL_TABLET | Freq: Two times a day (BID) | ORAL | Status: DC
  Administered 2018-11-14 – 2018-11-18 (×2): 0.5 mg via ORAL
  Filled 2018-11-14 (×5): qty 1
  Filled 2018-11-14: qty 14
  Filled 2018-11-14 (×7): qty 1
  Filled 2018-11-14: qty 14
  Filled 2018-11-14 (×2): qty 1

## 2018-11-14 MED ORDER — LORAZEPAM 2 MG/ML IJ SOLN: 2.0000 mg | Freq: Four times a day (QID) | INTRAMUSCULAR | Status: DC | PRN

## 2018-11-14 MED ORDER — RISPERIDONE 2 MG PO TABS
4.0000 mg | ORAL_TABLET | Freq: Every day | ORAL | Status: DC
  Administered 2018-11-16: 4 mg via ORAL
  Filled 2018-11-14 (×4): qty 2

## 2018-11-14 MED ORDER — ENSURE ENLIVE PO LIQD
237.0000 mL | Freq: Two times a day (BID) | ORAL | Status: DC
  Administered 2018-11-14 – 2018-11-16 (×5): 237 mL via ORAL

## 2018-11-14 MED ORDER — ARIPIPRAZOLE 2 MG PO TABS
2.0000 mg | ORAL_TABLET | Freq: Once | ORAL | Status: AC
  Administered 2018-11-14: 2 mg via ORAL
  Filled 2018-11-14 (×2): qty 1

## 2018-11-14 MED ORDER — OMEGA-3-ACID ETHYL ESTERS 1 G PO CAPS
1.0000 g | ORAL_CAPSULE | Freq: Two times a day (BID) | ORAL | Status: DC
  Administered 2018-11-14 – 2018-11-18 (×8): 1 g via ORAL
  Filled 2018-11-14 (×5): qty 1
  Filled 2018-11-14: qty 14
  Filled 2018-11-14 (×8): qty 1
  Filled 2018-11-14: qty 14
  Filled 2018-11-14: qty 1

## 2018-11-14 MED ORDER — LORAZEPAM 1 MG PO TABS: 2.0000 mg | ORAL_TABLET | Freq: Four times a day (QID) | ORAL | Status: DC | PRN

## 2018-11-14 MED ORDER — RISPERIDONE 2 MG PO TABS
2.0000 mg | ORAL_TABLET | Freq: Every day | ORAL | Status: DC
  Filled 2018-11-14 (×5): qty 1

## 2018-11-14 MED ORDER — PRENATAL MULTIVITAMIN CH
1.0000 | ORAL_TABLET | Freq: Every day | ORAL | Status: DC
  Administered 2018-11-15 – 2018-11-18 (×4): 1 via ORAL
  Filled 2018-11-14 (×6): qty 1
  Filled 2018-11-14: qty 7

## 2018-11-14 NOTE — [facility] Suicide Risk Assessment (Signed)
[facility] Admission Suicide Risk Assessment   Nursing information obtained from:  Patient Demographic factors:  Male, Living alone, Unemployed Current Mental Status:  NA Loss Factors:  Loss of significant relationship Historical Factors:  Family history of mental illness or substance abuse, Anniversary of important loss Risk Reduction Factors:  Sense of responsibility to family, Positive therapeutic relationship, Positive social support  Total Time spent with patient: 45 minutes Principal Problem: <principal problem not specified> Diagnosis:  Active Problems:   Schizophrenia (HCC)  Subjective Data: [name] is 38 years of age he has been diagnosed with a paranoid schizophrenic type condition, and is noncompliant with medication, has had a recent stressor of the loss of his grandfather in August 2019.  He is also daily cannabis user.  He has been noncompliant with the Risperdal that was prescribed in the emergency department in January of last year.  He now presents with thought blocking, probable hallucinations because he does appear to be responding to stimuli but he is generally pleasant, he gives vague and philosophical answers that are not meaningful when questioned.  He denies wanting to harm himself or others.  Continued Clinical Symptoms:  Alcohol Use Disorder Identification Test Final Score (AUDIT): 0 The "Alcohol Use Disorders Identification Test", Guidelines for Use in Primary Care, Second Edition.  World Science writer [facility]). Score between 0-7:  no or low risk or alcohol related problems. Score between 8-15:  moderate risk of alcohol related problems. Score between 16-19:  high risk of alcohol related problems. Score 20 or above:  warrants further diagnostic evaluation for alcohol dependence and treatment.   CLINICAL FACTORS:   Schizophrenia:   Less than 12 years old   COGNITIVE FEATURES THAT CONTRIBUTE TO RISK:  Loss of executive function    SUICIDE RISK:   Minimal: No  identifiable suicidal ideation.  Patients presenting with no risk factors but with morbid ruminations; may be classified as minimal risk based on the severity of the depressive symptoms  PLAN OF CARE: He admitted for stabilization and antipsychotic treatment  I certify that inpatient services furnished can reasonably be expected to improve the patient's condition.   Malvin Johns, MD 11/14/2018, 8:14 AM

## 2018-11-14 NOTE — Progress Notes (Signed)
[facility]  11/14/2018 8:16 AM Shawn Nicholson  MRN:  [id number] Subjective:    Shawn Nicholson is 38 years of age she is single and he lives alone however he has been diagnosed with a schizophrenic condition, requiring hospitalization at [facility] previously but he is been noncompliant for at least 2 years, about 1 year ago he presented to the emergency department with psychotic symptoms and was prescribed low-dose Risperdal but was not compliant.  He also is dependent upon cannabis for chronic.  Of time using daily.  This is been reflected in drug screens. Previous examiners have noted thought blocking, response to stimuli, and previously he had reported that people were taking his thoughts so that is a classic symptom of thought withdrawal and schizophrenia. On my exam he is alert and oriented to person place time situation but very vague and gives a lot of vague unusual answers rather than specific answers.  He pauses frequently during the interview to look around his if he is responding to stimuli when asked him if he is hearing things he at first admits that he is done later states he is not. He has no involuntary movements and he denies wanting to harm self or others.  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 Allergy   . Anxiety    no meds  . Boxer's fracture    RLF  . Depression     Past Surgical History:  Procedure Laterality Date  . CLOSED REDUCTION METACARPAL WITH PERCUTANEOUS PINNING  10/23/2012   Procedure: CLOSED REDUCTION  METACARPAL WITH PERCUTANEOUS PINNING;  Surgeon: Tami RibasKevin R Kuzma, MD;  Location: [facility];  Service: Orthopedics;  Laterality: Right;  . OPEN REDUCTION INTERNAL FIXATION (ORIF) METACARPAL  10/23/2012   Procedure: OPEN REDUCTION INTERNAL FIXATION (ORIF) METACARPAL;  Surgeon: Tami RibasKevin R Kuzma, MD;  Location: [facility];  Service: Orthopedics;;  RIGHT SMALL AND RING FINGER ORIF VERSUS CLOSED REDUCTION PERCUTANEOUS PINNING   Family History:  Family History  Problem Relation Age of Onset  . Hypertension Father   . Arthritis Mother   . Arthritis Other   . Hypertension Other   . Cancer Neg Hx   . Alcohol abuse Neg Hx   . Depression Neg Hx   . Diabetes Neg Hx   . Drug abuse Neg Hx   . Early death Neg Hx   . Heart disease Neg Hx   . Hyperlipidemia Neg Hx   . Kidney disease Neg Hx   . Stroke Neg Hx    Family Psychiatric  History: Unknown to patient Social History:  Social History   Substance and Sexual Activity  Alcohol Use Yes  . Alcohol/week: 3.0 standard drinks  . Types: 3 Cans of beer per week   Comment: social     Social History   Substance and Sexual Activity  Drug Use Yes  . Types: Marijuana    Social History   Socioeconomic History  . Marital status: Single    Spouse name: Not on file  . Number of children: Not on file  . Years of education:  Not on file  . Highest education level: Not on file  Occupational History  . Not on file  Social Needs  . Financial resource strain: Not on file  . Food insecurity:    Worry: Not on file    Inability: Not on file  . Transportation needs:    Medical: Not on file    Non-medical: Not on file  Tobacco Use  . Smoking status: Light Tobacco Smoker    Types: Cigarettes  . Smokeless tobacco: Never Used  Substance and Sexual Activity  . Alcohol use: Yes    Alcohol/week: 3.0 standard drinks    Types: 3 Cans of beer per week    Comment: social  . Drug use: Yes    Types: Marijuana  . Sexual activity:  Not Currently  Lifestyle  . Physical activity:    Days per week: Not on file    Minutes per session: Not on file  . Stress: Not on file  Relationships  . Social connections:    Talks on phone: Not on file    Gets together: Not on file    Attends religious service: Not on file    Active member of club or organization: Not on file    Attends meetings of clubs or organizations: Not on file    Relationship status: Not on file  Other Topics Concern  . Not on file  Social History Narrative  . Not on file   Additional Social History:    Pain Medications: see MAR Prescriptions: see MAR Over the Counter: see MAR History of alcohol / drug use?: Yes Longest period of sobriety (when/how long): none reported Name of Substance 1: Marijuana(pt stated that he really did not want to talk about his use) 1 - Age of First Use: unable to assess due to patient's paranoia 1 - Amount (size/oz): unable to assess due to patient's paranoia 1 - Frequency: daily 1 - Duration: unable to assess due to patient's paranoia 1 - Last Use / Amount: unable to assess due to patient's paranoia                  Sleep: Fair  Appetite:  Fair  Current Medications: Current Facility-Administered Medications  Medication Dose Route Frequency Provider Last Rate Last Dose  . acetaminophen (TYLENOL) tablet 650 mg  650 mg Oral Q6H PRN Kerry Hough, PA-C      . benztropine (COGENTIN) tablet 0.5 mg  0.5 mg Oral BID Malvin Johns, MD      . hydrOXYzine (ATARAX/VISTARIL) tablet 50 mg  50 mg Oral Q6H PRN Kerry Hough, PA-C      . LORazepam (ATIVAN) tablet 2 mg  2 mg Oral Q6H PRN Malvin Johns, MD       Or  . LORazepam (ATIVAN) injection 2 mg  2 mg Intramuscular Q6H PRN Malvin Johns, MD      . omega-3 acid ethyl esters (LOVAZA) capsule 1 g  1 g Oral BID Malvin Johns, MD      . prenatal multivitamin tablet 1 tablet  1 tablet Oral Q1200 Malvin Johns, MD      . risperiDONE (RISPERDAL) tablet 2 mg  2 mg Oral Daily  Malvin Johns, MD      . risperiDONE (RISPERDAL) tablet 4 mg  4 mg Oral QHS Malvin Johns, MD      . traZODone (DESYREL) tablet 100 mg  100 mg Oral QHS,MR X 1 Kerry Hough, PA-C        Lab  Results:  Results for orders placed or performed during the hospital encounter of 11/13/18 (from the past 48 hour(s))  CBC     Status: Abnormal   Collection Time: 11/13/18  7:00 PM  Result Value Ref Range   WBC 6.1 4.0 - 10.5 K/uL   RBC 5.69 4.22 - 5.81 MIL/uL   Hemoglobin 16.1 13.0 - 17.0 g/dL   HCT 16.1 09.6 - 04.5 %   MCV 79.1 (L) 80.0 - 100.0 fL   MCH 28.3 26.0 - 34.0 pg   MCHC 35.8 30.0 - 36.0 g/dL   RDW 40.9 81.1 - 91.4 %   Platelets 305 150 - 400 K/uL   nRBC 0.0 0.0 - 0.2 %    Comment: Performed at [facility], [address] [place], [place] [address]  Comprehensive metabolic panel     Status: Abnormal   Collection Time: 11/13/18  7:00 PM  Result Value Ref Range   Sodium 139 135 - 145 mmol/L   Potassium 3.3 (L) 3.5 - 5.1 mmol/L   Chloride 100 98 - 111 mmol/L   CO2 27 22 - 32 mmol/L   Glucose, Bld 101 (H) 70 - 99 mg/dL   BUN 15 6 - 20 mg/dL   Creatinine, Ser 6.21 0.61 - 1.24 mg/dL   Calcium 30.8 8.9 - 65.7 mg/dL   Total Protein 8.7 (H) 6.5 - 8.1 g/dL   Albumin 5.1 (H) 3.5 - 5.0 g/dL   AST 22 15 - 41 U/L   ALT 15 0 - 44 U/L   Alkaline Phosphatase 65 38 - 126 U/L   Total Bilirubin 1.2 0.3 - 1.2 mg/dL   GFR calc non Af Amer >60 >60 mL/min   GFR calc Af Amer >60 >60 mL/min   Anion gap 12 5 - 15    Comment: Performed at [facility], [address] [place], [place] [address]  Ethanol     Status: None   Collection Time: 11/13/18  7:00 PM  Result Value Ref Range   Alcohol, Ethyl (B) <10 <10 mg/dL    Comment: (NOTE) Lowest detectable limit for serum alcohol is 10 mg/dL. For medical purposes only. Performed at [facility], [address], [place], [place] [address]   Hemoglobin A1c     Status: None   Collection Time:  11/13/18  7:00 PM  Result Value Ref Range   Hgb A1c MFr Bld 5.0 4.8 - 5.6 %    Comment: (NOTE) Pre diabetes:          5.7%-6.4% Diabetes:              >6.4% Glycemic control for   <7.0% adults with diabetes    Mean Plasma Glucose 96.8 mg/dL    Comment: Performed at [facility], [address] [place], [place] [address]  Lipid panel     Status: Abnormal   Collection Time: 11/13/18  7:00 PM  Result Value Ref Range   Cholesterol 155 0 - 200 mg/dL   Triglycerides 34 <401 mg/dL   HDL 36 (L) >02 mg/dL   Total CHOL/HDL Ratio 4.3 RATIO   VLDL 7 0 - 40 mg/dL   LDL Cholesterol 725 (H) 0 - 99 mg/dL    Comment:        Total Cholesterol/HDL:CHD Risk Coronary Heart Disease Risk Table                     Men   Women  1/2  Average Risk   3.4   3.3  Average Risk       5.0   4.4  2 X Average Risk   9.6   7.1  3 X Average Risk  23.4   11.0        Use the calculated Patient Ratio above and the CHD Risk Table to determine the patient's CHD Risk.        ATP III CLASSIFICATION (LDL):  <100     mg/dL   Optimal  546-270  mg/dL   Near or Above                    Optimal  130-159  mg/dL   Borderline  350-093  mg/dL   High  >818     mg/dL   Very High Performed at [facility], [address], [place], [place] [address]   TSH     Status: None   Collection Time: 11/13/18  7:00 PM  Result Value Ref Range   TSH 0.387 0.350 - 4.500 uIU/mL    Comment: Performed by a 3rd Generation assay with a functional sensitivity of <=0.01 uIU/mL. Performed at [facility], [address] [place], [place] [address]     Blood Alcohol level:  Lab Results  Component Value Date   [facility] <10 11/13/2018   ETH <10 12/03/2017    Metabolic Disorder Labs: Lab Results  Component Value Date   HGBA1C 5.0 11/13/2018   MPG 96.8 11/13/2018   No results found for: PROLACTIN Lab Results  Component Value Date   CHOL 155 11/13/2018   TRIG 34 11/13/2018   HDL 36 (L)  11/13/2018   CHOLHDL 4.3 11/13/2018   VLDL 7 11/13/2018   LDLCALC 112 (H) 11/13/2018   LDLCALC 61 05/25/2015    Physical Findings: AIMS: Facial and Oral Movements Muscles of Facial Expression: None, normal Lips and Perioral Area: None, normal Jaw: None, normal Tongue: None, normal,Extremity Movements Upper (arms, wrists, hands, fingers): None, normal Lower (legs, knees, ankles, toes): None, normal, Trunk Movements Neck, shoulders, hips: None, normal, Overall Severity Severity of abnormal movements (highest score from questions above): None, normal Incapacitation due to abnormal movements: None, normal Patient's awareness of abnormal movements (rate only patient's report): No Awareness, Dental Status Current problems with teeth and/or dentures?: No Does patient usually wear dentures?: No  CIWA:  CIWA-Ar Total: 0 COWS:  COWS Total Score: 0  Musculoskeletal: Strength & Muscle Tone: within normal limits Gait & Station: normal Patient leans: N/A  Psychiatric Specialty Exam: Physical Exam lungs clear sinus rhythm  ROS denies history of head trauma  Blood pressure (!) 128/92, pulse 92, temperature 97.9 F (36.6 C), temperature source Oral, resp. rate 16, height 6\' 5"  (1.956 m), weight 79.8 kg, SpO2 100 %.Body mass index is 20.87 kg/m.  General Appearance: Casual  Eye Contact:  Fair  Speech:  Clear and Coherent  Volume:  Decreased  Mood:  Euphoric  Affect:  Full Range  Thought Process:  Disorganized and Irrelevant  Orientation:  Full (Time, Place, and Person)  Thought Content:  Hallucinations: Auditory and Tangential  Suicidal Thoughts:  No  Homicidal Thoughts:  No  Memory:  Immediate;   Fair  Judgement:  Fair  Insight:  Fair  Psychomotor Activity:  Normal  Concentration:  Concentration: Fair  Recall:  [employer] of Knowledge:  Fair  Language:  Good  Akathisia:  Negative  Handed:  Right  AIMS (if indicated):  Assets:  Manufacturing systems engineer Physical Health  ADL's:   Intact  Cognition:  WNL  Sleep:        Treatment Plan Summary: Daily contact with patient to assess and evaluate symptoms and progress in treatment, Medication management and Plan Resume Risperdal at 6 mg a day add benztropine add Ativan as needed continue reality based and cognitive-based therapies, discussed long-acting injectable, give test dose of aripiprazole tomorrow.  Full treatment per team  Malvin Johns, MD 11/14/2018, 8:16 AM

## 2018-11-14 NOTE — BHH Group Notes (Signed)
Pt did not attend wrap up group this evening. Pt was asleep in bed.  

## 2018-11-14 NOTE — Tx Team (Signed)
Interdisciplinary Treatment and Diagnostic Plan Update  11/14/2018 Time of Session: 9:00am Shawn Nicholson MRN: [id number]  Principal Diagnosis: <principal problem not specified>  Secondary Diagnoses: Active Problems:   Schizophrenia ([place])   Current Medications:  Current Facility-Administered Medications  Medication Dose Route Frequency Provider Last Rate Last Dose  . acetaminophen (TYLENOL) tablet 650 mg  650 mg Oral Q6H PRN Laverle Hobby, PA-C      . benztropine (COGENTIN) tablet 0.5 mg  0.5 mg Oral BID Johnn Hai, MD   0.5 mg at 11/14/18 0955  . feeding supplement (ENSURE ENLIVE) (ENSURE ENLIVE) liquid 237 mL  237 mL Oral BID BM Johnn Hai, MD   237 mL at 11/14/18 0956  . hydrOXYzine (ATARAX/VISTARIL) tablet 50 mg  50 mg Oral Q6H PRN Laverle Hobby, PA-C      . LORazepam (ATIVAN) tablet 2 mg  2 mg Oral Q6H PRN Johnn Hai, MD       Or  . LORazepam (ATIVAN) injection 2 mg  2 mg Intramuscular Q6H PRN Johnn Hai, MD      . omega-3 acid ethyl esters (LOVAZA) capsule 1 g  1 g Oral BID Johnn Hai, MD   1 g at 11/14/18 0955  . prenatal multivitamin tablet 1 tablet  1 tablet Oral Q1200 Johnn Hai, MD      . risperiDONE (RISPERDAL) tablet 2 mg  2 mg Oral Daily Johnn Hai, MD      . risperiDONE (RISPERDAL) tablet 4 mg  4 mg Oral QHS Johnn Hai, MD      . traZODone (DESYREL) tablet 100 mg  100 mg Oral QHS,MR X 1 Simon, Spencer E, PA-C       PTA Medications: No medications prior to admission.    Patient Stressors: Medication change or noncompliance Substance abuse  Patient Strengths: Ability for insight Capable of independent living Motivation for treatment/growth  Treatment Modalities: Medication Management, Group therapy, Case management,  1 to 1 session with clinician, Psychoeducation, Recreational therapy.   Physician Treatment Plan for Primary Diagnosis: <principal problem not specified> Long Term Goal(s):     Short Term Goals:    Medication Management:  Evaluate patient's response, side effects, and tolerance of medication regimen.  Therapeutic Interventions: 1 to 1 sessions, Unit Group sessions and Medication administration.  Evaluation of Outcomes: Not Met  Physician Treatment Plan for Secondary Diagnosis: Active Problems:   Schizophrenia ([place])  Long Term Goal(s):     Short Term Goals:       Medication Management: Evaluate patient's response, side effects, and tolerance of medication regimen.  Therapeutic Interventions: 1 to 1 sessions, Unit Group sessions and Medication administration.  Evaluation of Outcomes: Not Met   RN Treatment Plan for Primary Diagnosis: <principal problem not specified> Long Term Goal(s): Knowledge of disease and therapeutic regimen to maintain health will improve  Short Term Goals: Ability to verbalize frustration and anger appropriately will improve, Ability to participate in decision making will improve, Ability to verbalize feelings will improve, Ability to disclose and discuss suicidal ideas, Ability to identify and develop effective coping behaviors will improve and Compliance with prescribed medications will improve  Medication Management: RN will administer medications as ordered by provider, will assess and evaluate patient's response and provide education to patient for prescribed medication. RN will report any adverse and/or side effects to prescribing provider.  Therapeutic Interventions: 1 on 1 counseling sessions, Psychoeducation, Medication administration, Evaluate responses to treatment, Monitor vital signs and CBGs as ordered, Perform/monitor CIWA, COWS, AIMS and Fall  Risk screenings as ordered, Perform wound care treatments as ordered.  Evaluation of Outcomes: Not Met   LCSW Treatment Plan for Primary Diagnosis: <principal problem not specified> Long Term Goal(s): Safe transition to appropriate next level of care at discharge, Engage patient in therapeutic group addressing interpersonal  concerns.  Short Term Goals: Engage patient in aftercare planning with referrals and resources, Increase ability to appropriately verbalize feelings, Increase emotional regulation, Identify triggers associated with mental health/substance abuse issues and Increase skills for wellness and recovery  Therapeutic Interventions: Assess for all discharge needs, 1 to 1 time with Social worker, Explore available resources and support systems, Assess for adequacy in community support network, Educate family and significant other(s) on suicide prevention, Complete Psychosocial Assessment, Interpersonal group therapy.  Evaluation of Outcomes: Not Met   Progress in Treatment: Attending groups: No. Participating in groups: No. Taking medication as prescribed: Yes. Toleration medication: Yes. Family/Significant other contact made: No, will contact:  mother Patient understands diagnosis: Yes. Discussing patient identified problems/goals with staff: No. Medical problems stabilized or resolved: No. Denies suicidal/homicidal ideation: Yes. Issues/concerns per patient self-inventory: No.  New problem(s) identified: No, Describe:  CSW continuing to assess  New Short Term/Long Term Goal(s): medication management for mood stabilization; elimination of SI thoughts; development of comprehensive mental wellness/sobriety plan.  Patient Goals: Patient disoriented and vague, unable to assess a goal at this time.  Discharge Plan or Barriers: [place] pamphlet, Mobile Crisis information, and AA/NA information provided to patient for additional community support and resources.   Reason for Continuation of Hospitalization: Anxiety Delusions  Depression Hallucinations Medication stabilization  Estimated Length of Stay: 5-7 days  Attendees: Patient: 11/14/2018 10:55 AM  Physician:  11/14/2018 10:55 AM  Nursing:  11/14/2018 10:55 AM  RN Care Manager: 11/14/2018 10:55 AM  Social Worker: Stephanie Acre, [place] 11/14/2018 10:55  AM  Recreational Therapist:  11/14/2018 10:55 AM  Other:  11/14/2018 10:55 AM  Other:  11/14/2018 10:55 AM  Other: 11/14/2018 10:55 AM    Scribe for Treatment Team: Joellen Jersey, [place] 11/14/2018 10:55 AM

## 2018-11-14 NOTE — Progress Notes (Signed)
NUTRITION ASSESSMENT  Pt identified as at risk on the Malnutrition Screen Tool  INTERVENTION: - Will order Ensure Enlive BID, each supplement provides 350 kcal and 20 grams of protein. - Continue to encourage PO intakes.    NUTRITION DIAGNOSIS: Unintentional weight loss related to sub-optimal intake as evidenced by pt report.   Goal: Pt to meet >/= 90% of their estimated nutrition needs.  Monitor:  PO intake  Assessment:  Patient admitted for schizophrenia. He was diagnosed with the same 2 years ago but has been non-compliant with medications.   Per chart review, current weight is 175 lb and weight on 12/03/17 was 195 lb. This indicates 20 lb weight loss (10% body weight) in the past 1 year; not significant for time frame.    38 y.o. male  Height: Ht Readings from Last 1 Encounters:  11/13/18 6\' 5"  (1.956 m)    Weight: Wt Readings from Last 1 Encounters:  11/13/18 79.8 kg    Weight Hx: Wt Readings from Last 10 Encounters:  11/13/18 79.8 kg  12/03/17 88.5 kg  05/25/15 95.3 kg  11/23/13 87.5 kg  11/21/12 75.4 kg  10/23/12 73 kg  06/25/11 80.5 kg    BMI:  Body mass index is 20.87 kg/m. Pt meets criteria for normal weight based on current BMI.  Estimated Nutritional Needs: Kcal: 25-30 kcal/kg Protein: > 1 gram protein/kg Fluid: 1 ml/kcal  Diet Order:  Diet Order            Diet regular Room service appropriate? Yes; Fluid consistency: Thin  Diet effective now             Pt is also offered choice of unit snacks mid-morning and mid-afternoon.  Pt is eating as desired.   Lab results and medications reviewed.     Trenton Gammon, MS, RD, LDN, [facility] Inpatient Clinical Dietitian Pager # [phone] After hours/weekend pager # [phone]

## 2018-11-14 NOTE — Progress Notes (Signed)
PT attended spirituality group led by chaplain.   Group focused on topic of hope.  Group members engaged in facilitated discussion around topic of hope, identifying areas they have experienced hope in their life and resources for hope.   Engaged in visual explorer activity to identify what hope means for them today.   

## 2018-11-14 NOTE — Plan of Care (Signed)
Progress note  D: pt found in bed; compliant with morning medications only. Pt states he slept poorly. Pt rates his depression/hopelessness/anxiety a 03/13/09 out of 10 respectively. Pt has complaints of lightheadedness and headaches, but denies any other pain, rating this a 0/10. Pt states his goal for today is to work on his mind, and he will achieve this by meditating. Pt denies any si/hi/ah/vh and verbally agrees to approach staff if these become apparent or before harming himself or others while at [facility]. Pt continues to be paranoid and has refused both his lunch and dinner medications.  A: pt provided support and encouragement. Pt given medication and education per protocol and standing orders. Q66m safety checks implemented and continued.  R: pt safe on the unit. Will continue to monitor.   Pt progressing in the following metrics  Problem: Education: Goal: Knowledge of [facility] Education information/materials will improve Outcome: Progressing Goal: Emotional status will improve Outcome: Progressing Goal: Mental status will improve Outcome: Progressing Goal: Verbalization of understanding the information provided will improve Outcome: Progressing

## 2018-11-15 DIAGNOSIS — G4700 Insomnia, unspecified: Secondary | ICD-10-CM

## 2018-11-15 DIAGNOSIS — F250 Schizoaffective disorder, bipolar type: Secondary | ICD-10-CM

## 2018-11-15 DIAGNOSIS — F419 Anxiety disorder, unspecified: Secondary | ICD-10-CM

## 2018-11-15 DIAGNOSIS — E785 Hyperlipidemia, unspecified: Secondary | ICD-10-CM

## 2018-11-15 DIAGNOSIS — E876 Hypokalemia: Secondary | ICD-10-CM

## 2018-11-15 MED ORDER — DIPHENHYDRAMINE HCL 50 MG/ML IJ SOLN
50.0000 mg | Freq: Once | INTRAMUSCULAR | Status: AC
  Administered 2018-11-15: 50 mg via INTRAMUSCULAR
  Filled 2018-11-15 (×2): qty 1

## 2018-11-15 MED ORDER — RISPERIDONE MICROSPHERES ER 25 MG IM SRER
25.0000 mg | INTRAMUSCULAR | Status: DC
  Administered 2018-11-15: 25 mg via INTRAMUSCULAR
  Filled 2018-11-15 (×2): qty 2

## 2018-11-15 NOTE — BHH Group Notes (Signed)
  Pt did not attend RN Psychoeducational group this morning. 
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
## 2018-11-15 NOTE — Plan of Care (Signed)
  Problem: Activity: Goal: Interest or engagement in activities will improve Outcome: Progressing   

## 2018-11-15 NOTE — Progress Notes (Signed)
Pt presents with an animated affect and anxious mood. Pt noted to be overly polite with a fixed smile on approach. During medication administration pt rubbed writer's hand after receiving his ensure. Pt appeared to be guarded and cautious during shift assessment. Pt forwarded little information and denied having any AVH or paranoia. Pt denies SI/HI. Pt refused to take all scheduled meds. Pt reported difficulty sleeping last night but refuses to take any medications to help with sleep.  Medications reviewed with pt. Pt encouraged to take medications as scheduled. Verbal support provided. Pt encouraged to attend groups. 15 minute checks performed for safety.  Pt compliant with attending all scheduled groups.
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
## 2018-11-15 NOTE — Progress Notes (Signed)
D:Pt has been in his room resting on and off since receiving scheduled injections this afternoon. Pt has a urine specimen cup in his room and was reminded to bring it to the nurses station when complete. Pt took his medications without complaint and is tolerating well. He went to eat dinner in the cafeteria and is currently in his room. A:Offered support, encouragement and 15 minute checks.  R:Safety maintained on the unit.

## 2018-11-15 NOTE — BHH Group Notes (Signed)
LCSW Group Therapy Note  11/15/2018    11:15am-12:00pm   Type of Therapy and Topic:  Group Therapy: Early Messages Received About Anger  Participation Level:  Active   Description of Group:   In this group, patients shared and discussed the early messages received in their lives about anger through parental or other adult modeling, teaching, repression, punishment, violence, and more.  Participants identified how those childhood lessons influence even now how they usually or often react when angered.  The group discussed that anger is a secondary emotion and what may be the underlying emotional themes that come out through anger outbursts or that are ignored through anger suppression.  Finally, as a group there was a conversation about the workbook's quote that "There is nothing wrong with anger; it is just a sign something needs to change."     Therapeutic Goals: 1. Patients will identify one or more childhood message about anger that they received and how it was taught to them. 2. Patients will discuss how these childhood experiences have influenced and continue to influence their own expression or repression of anger even today. 3. Patients will explore possible primary emotions that tend to fuel their secondary emotion of anger. 4. Patients will learn that anger itself is normal and cannot be eliminated, and that healthier coping skills can assist with resolving conflict rather than worsening situations.  Summary of Patient Progress:  The patient shared that his childhood lessons about anger were to use anger as both a strength and a weakness.  He shared that his childhood was "rough" and as a result he acted out a lot, would hit walls.  Now he will write poetry and write beats.  He stated that he has had times he suppressed his anger, but not now.  Therapeutic Modalities:   Cognitive Behavioral Therapy Motivation Interviewing  Lynnell Chad  .

## 2018-11-15 NOTE — Progress Notes (Signed)
Nursing Progress Note: 7p-7a D: Pt currently presents with a blocking/preoccupied/pleasant/worried affect and behavior. Not interacting with the milieu. Pt reports good sleep during the previous night with current medication regimen. Pt did not attend wrap-up group.  A: Pt refused medications per providers orders. Pt's labs and vitals were monitored throughout the night. Pt supported emotionally and encouraged to express concerns and questions. Pt educated on medications.  R: Pt's safety ensured with 15 minute and environmental checks. Pt currently denies SI, HI, and AVH. Pt verbally contracts to seek staff if SI,HI, or AVH occurs and to consult with staff before acting on any harmful thoughts. Will continue to monitor.

## 2018-11-16 ENCOUNTER — Encounter (HOSPITAL_COMMUNITY): Payer: Self-pay

## 2018-11-16 LAB — URINALYSIS, COMPLETE (UACMP) WITH MICROSCOPIC
BILIRUBIN URINE: NEGATIVE
Bacteria, UA: NONE SEEN
Glucose, UA: NEGATIVE mg/dL
Hgb urine dipstick: NEGATIVE
Ketones, ur: NEGATIVE mg/dL
LEUKOCYTES UA: NEGATIVE
NITRITE: NEGATIVE
Protein, ur: NEGATIVE mg/dL
Specific Gravity, Urine: 1.03 (ref 1.005–1.030)
pH: 6 (ref 5.0–8.0)

## 2018-11-16 LAB — RAPID URINE DRUG SCREEN, HOSP PERFORMED
Amphetamines: NOT DETECTED
Barbiturates: NOT DETECTED
Benzodiazepines: NOT DETECTED
Cocaine: NOT DETECTED
Opiates: NOT DETECTED
TETRAHYDROCANNABINOL: POSITIVE — AB

## 2018-11-16 NOTE — Progress Notes (Signed)
Patient ID: Shawn Nicholson, male   DOB: September 03, 1981, 38 y.o.   MRN: [id number] D: Assumed care patient @ 2330. Patient in bed sleeping. Respiration regular and unlabored. No sign of distress noted at this time A: 15 mins checks for safety. R: Patient remains safe.

## 2018-11-16 NOTE — Plan of Care (Signed)
D: Patient presents calm and cooperative, staring out the window. He admits to previously having AVH, but attributes it to "the wind blowing". He slept poorly last night, and did not request medication to help with sleep. His appetite is fair, energy high and concentration good. He denies withdrawal symptoms or physical complaints. Patient denies SI/HI/AVH.  A: Patient checked q15 min, and checks reviewed. Reviewed medication changes with patient and educated on side effects. Educated patient on importance of attending group therapy sessions and educated on several coping skills. Encouarged participation in milieu through recreation therapy and attending meals with peers. Support and encouragement provided. Fluids offered. R: Patient receptive to education on medications, and is medication compliant. Patient contracts for safety on the unit.

## 2018-11-16 NOTE — Progress Notes (Signed)
[facility]  11/16/2018 11:57 AM Shawn Lawrence MarseillesM Seat  MRN:  [id number] Subjective:  Patient is seen and examined.  Patient is a 38 year old male with a past psychiatric history significant for schizophrenia who was admitted on 11/13/2018 with worsening paranoia and delusional thinking.  The patient had been hospitalized at [facility] in 2018 at this time, and had been prescribed medications, but did not take them.  He was admitted to the hospital for evaluation and stabilization.  Objective: Patient is a 38 year old male with the above-stated past psychiatric history who is seen in follow-up.  He remains significantly delusional.  He talks about "energy sources" another things along the sources.  He refused his oral Risperdal last night, but did take the Risperdal injection.  He still thinks that the only medication he did like to take is "marijuana".  He is tangential, but not homicidal or suicidal.  His vital signs are stable.  He is afebrile.  He slept 6.5 hours last night, but did receive Benadryl IM when he got the Risperdal injection.  Principal Problem: <principal problem not specified> Diagnosis: Active Problems:   Schizophrenia (HCC)  Total Time spent with patient: 15 minutes  Past Psychiatric History: See admission H&P  Past Medical History:  Past Medical History:  Diagnosis Date  . Allergy   . Anxiety    no meds  . Boxer's fracture    RLF  . Depression     Past Surgical History:  Procedure Laterality Date  . CLOSED REDUCTION METACARPAL WITH PERCUTANEOUS PINNING  10/23/2012   Procedure: CLOSED REDUCTION METACARPAL WITH PERCUTANEOUS PINNING;  Surgeon: Tami RibasKevin R Kuzma, MD;  Location: [facility];  Service: Orthopedics;  Laterality: Right;  . OPEN REDUCTION INTERNAL FIXATION (ORIF) METACARPAL  10/23/2012   Procedure: OPEN REDUCTION INTERNAL FIXATION (ORIF) METACARPAL;  Surgeon: Tami RibasKevin R Kuzma, MD;  Location: [facility];  Service: Orthopedics;;  RIGHT  SMALL AND RING FINGER ORIF VERSUS CLOSED REDUCTION PERCUTANEOUS PINNING   Family History:  Family History  Problem Relation Age of Onset  . Hypertension Father   . Arthritis Mother   . Arthritis Other   . Hypertension Other   . Cancer Neg Hx   . Alcohol abuse Neg Hx   . Depression Neg Hx   . Diabetes Neg Hx   . Drug abuse Neg Hx   . Early death Neg Hx   . Heart disease Neg Hx   . Hyperlipidemia Neg Hx   . Kidney disease Neg Hx   . Stroke Neg Hx    Family Psychiatric  History: See admission H&P Social History:  Social History   Substance and Sexual Activity  Alcohol Use Yes  . Alcohol/week: 3.0 standard drinks  . Types: 3 Cans of beer per week   Comment: social     Social History   Substance and Sexual Activity  Drug Use Yes  . Types: Marijuana    Social History   Socioeconomic History  . Marital status: Single    Spouse name: Not on file  . Number of children: Not on file  . Years of education: Not on file  . Highest education level: Not on file  Occupational History  . Not on file  Social Needs  . Financial resource strain: Not on file  . Food insecurity:    Worry: Not on file    Inability: Not on file  . Transportation needs:    Medical: Not on file    Non-medical: Not  on file  Tobacco Use  . Smoking status: Light Tobacco Smoker    Types: Cigarettes  . Smokeless tobacco: Never Used  Substance and Sexual Activity  . Alcohol use: Yes    Alcohol/week: 3.0 standard drinks    Types: 3 Cans of beer per week    Comment: social  . Drug use: Yes    Types: Marijuana  . Sexual activity: Not Currently  Lifestyle  . Physical activity:    Days per week: Not on file    Minutes per session: Not on file  . Stress: Not on file  Relationships  . Social connections:    Talks on phone: Not on file    Gets together: Not on file    Attends religious service: Not on file    Active member of club or organization: Not on file    Attends meetings of clubs or  organizations: Not on file    Relationship status: Not on file  Other Topics Concern  . Not on file  Social History Narrative  . Not on file   Additional Social History:    Pain Medications: see MAR Prescriptions: see MAR Over the Counter: see MAR History of alcohol / drug use?: Yes Longest period of sobriety (when/how long): none reported Name of Substance 1: Marijuana(pt stated that he really did not want to talk about his use) 1 - Age of First Use: unable to assess due to patient's paranoia 1 - Amount (size/oz): unable to assess due to patient's paranoia 1 - Frequency: daily 1 - Duration: unable to assess due to patient's paranoia 1 - Last Use / Amount: unable to assess due to patient's paranoia                  Sleep: Good  Appetite:  Good  Current Medications: Current Facility-Administered Medications  Medication Dose Route Frequency Provider Last Rate Last Dose  . acetaminophen (TYLENOL) tablet 650 mg  650 mg Oral Q6H PRN Kerry HoughSimon, Spencer E, PA-C      . benztropine (COGENTIN) tablet 0.5 mg  0.5 mg Oral BID Malvin JohnsFarah, Brian, MD   Stopped at 11/15/18 1644  . feeding supplement (ENSURE ENLIVE) (ENSURE ENLIVE) liquid 237 mL  237 mL Oral BID BM Malvin JohnsFarah, Brian, MD   237 mL at 11/16/18 0919  . hydrOXYzine (ATARAX/VISTARIL) tablet 50 mg  50 mg Oral Q6H PRN Kerry HoughSimon, Spencer E, PA-C      . LORazepam (ATIVAN) tablet 2 mg  2 mg Oral Q6H PRN Malvin JohnsFarah, Brian, MD       Or  . LORazepam (ATIVAN) injection 2 mg  2 mg Intramuscular Q6H PRN Malvin JohnsFarah, Brian, MD      . omega-3 acid ethyl esters (LOVAZA) capsule 1 g  1 g Oral BID Malvin JohnsFarah, Brian, MD   1 g at 11/16/18 [id number]  . prenatal multivitamin tablet 1 tablet  1 tablet Oral Q1200 Malvin JohnsFarah, Brian, MD   1 tablet at 11/15/18 1256  . risperiDONE (RISPERDAL) tablet 2 mg  2 mg Oral Daily Malvin JohnsFarah, Brian, MD      . risperiDONE (RISPERDAL) tablet 4 mg  4 mg Oral QHS Malvin JohnsFarah, Brian, MD   Stopped at 11/15/18 2200  . risperiDONE microspheres (RISPERDAL CONSTA) injection  25 mg  25 mg Intramuscular Q14 Days Antonieta Pertlary, Amatullah Christy Lawson, MD   25 mg at 11/15/18 1515  . traZODone (DESYREL) tablet 100 mg  100 mg Oral QHS,MR X 1 Kerry HoughSimon, Spencer E, PA-C        Lab Results:  No results found for this or any previous visit (from the past 48 hour(s)).  Blood Alcohol level:  Lab Results  Component Value Date   ETH <10 11/13/2018   ETH <10 12/03/2017    Metabolic Disorder Labs: Lab Results  Component Value Date   HGBA1C 5.0 11/13/2018   MPG 96.8 11/13/2018   No results found for: PROLACTIN Lab Results  Component Value Date   CHOL 155 11/13/2018   TRIG 34 11/13/2018   HDL 36 (L) 11/13/2018   CHOLHDL 4.3 11/13/2018   VLDL 7 11/13/2018   LDLCALC 112 (H) 11/13/2018   LDLCALC 61 05/25/2015    Physical Findings: AIMS: Facial and Oral Movements Muscles of Facial Expression: None, normal Lips and Perioral Area: None, normal Jaw: None, normal Tongue: None, normal,Extremity Movements Upper (arms, wrists, hands, fingers): None, normal Lower (legs, knees, ankles, toes): None, normal, Trunk Movements Neck, shoulders, hips: None, normal, Overall Severity Severity of abnormal movements (highest score from questions above): None, normal Incapacitation due to abnormal movements: None, normal Patient's awareness of abnormal movements (rate only patient's report): No Awareness, Dental Status Current problems with teeth and/or dentures?: No Does patient usually wear dentures?: No  CIWA:  CIWA-Ar Total: 0 COWS:  COWS Total Score: 0  Musculoskeletal: Strength & Muscle Tone: within normal limits Gait & Station: normal Patient leans: N/A  Psychiatric Specialty Exam: Physical Exam  Nursing note and vitals reviewed. Constitutional: He is oriented to person, place, and time. He appears well-developed and well-nourished.  HENT:  Head: Normocephalic and atraumatic.  Respiratory: Effort normal.  Neurological: He is alert and oriented to person, place, and time.    ROS   Blood pressure 118/82, pulse 83, temperature 98.5 F (36.9 C), temperature source Oral, resp. rate 18, height 6\' 5"  (1.956 m), weight 79.8 kg, SpO2 100 %.Body mass index is 20.87 kg/m.  General Appearance: Casual  Eye Contact:  Fair  Speech:  Pressured  Volume:  Normal  Mood:  Anxious, Euphoric and Irritable  Affect:  Labile  Thought Process:  Goal Directed and Descriptions of Associations: Tangential  Orientation:  Full (Time, Place, and Person)  Thought Content:  Delusions  Suicidal Thoughts:  No  Homicidal Thoughts:  No  Memory:  Immediate;   Fair Recent;   Fair Remote;   Fair  Judgement:  Impaired  Insight:  Lacking  Psychomotor Activity:  Increased  Concentration:  Concentration: Fair and Attention Span: Fair  Recall:  [employer] of Knowledge:  Fair  Language:  Fair  Akathisia:  Negative  Handed:  Right  AIMS (if indicated):     Assets:  Desire for Improvement Physical Health Resilience  ADL's:  Intact  Cognition:  WNL  Sleep:  Number of Hours: 6.75     Treatment Plan Summary: Daily contact with patient to assess and evaluate symptoms and progress in treatment, Medication management and Plan : Patient is seen and examined.  Patient is a 38 year old male with a past psychiatric history significant for schizophrenia.  He is seen in follow-up. Patient is essentially unchanged from yesterday.  Hopefully the Risperdal injection will be kicking in the next couple of days.  He has refused the oral Risperdal.  No other changes in his medications.  He apparently refused his blood drawn potassium, and I will repeat that order for tomorrow. 1.  Continue Cogentin 0.5 mg p.o. twice daily for side effects of Risperdal. 2.  Continue omega-3 fatty acids 1 g p.o. twice daily for lipids. 3.  We  will continue to have the oral Risperdal orders present, but he has been refusing those.  It will be 2 mg p.o. daily and 4 mg p.o. nightly.  This is for psychosis. 4.  Patient received the  long-acting risperidone microsphere's yesterday 25 mg.  Hopefully that will kick in for his psychosis and mood stability. 5.  Continue trazodone 100 mg p.o. nightly as needed insomnia. 6.  Disposition planning-in progress.  Antonieta Pert, MD 11/16/2018, 11:57 AM

## 2018-11-16 NOTE — Progress Notes (Signed)
Patient ID: Shawn Nicholson, male   DOB: [birth year], 38 y.o.   MRN: [id number] D: Patient in dayroom playing cards with peers. Pt reports he is doing well. Pt thought process is organized and behavior is appropriate. Pt reports he is tolerating medication well.  Pt denies SI/HI/AVH and pain. Pt attended and participated in evening wrap up group. Cooperative with assessment. No acute distressed noted at this time.   A: Medications administered as prescribed. Support and encouragement provided as needed. Pt encouraged to discuss feelings and come to staff with any question or concerns.   R: Patient remains safe and complaint with medications.

## 2018-11-16 NOTE — [facility] Group Notes (Signed)
[facility] LCSW Group Therapy Note  Date/Time:  11/16/2018  11:00AM-12:00PM  Type of Therapy and Topic:  Group Therapy:  Music and Mood  Participation Level:  Active   Description of Group: In this process group, members listened to a variety of genres of music and identified that different types of music evoke different responses.  Patients were encouraged to identify music that was soothing for them and music that was energizing for them.  Patients discussed how this knowledge can help with wellness and recovery in various ways including managing depression and anxiety as well as encouraging healthy sleep habits.    Therapeutic Goals: 1. Patients will explore the impact of different varieties of music on mood 2. Patients will verbalize the thoughts they have when listening to different types of music 3. Patients will identify music that is soothing to them as well as music that is energizing to them 4. Patients will discuss how to use this knowledge to assist in maintaining wellness and recovery 5. Patients will explore the use of music as a coping skill  Summary of Patient Progress:  At the beginning of group, patient expressed that he wanted to hear a song that CSW was unable to play due to the inappropriateness of the lyrics.  He did not argue with this but rather chose a different song.    Therapeutic Modalities: Solution Focused Brief Therapy Activity   Ambrose Mantle, LCSW

## 2018-11-17 LAB — BASIC METABOLIC PANEL
Anion gap: 8 (ref 5–15)
BUN: 15 mg/dL (ref 6–20)
CO2: 25 mmol/L (ref 22–32)
Calcium: 9 mg/dL (ref 8.9–10.3)
Chloride: 106 mmol/L (ref 98–111)
Creatinine, Ser: 0.93 mg/dL (ref 0.61–1.24)
GFR calc non Af Amer: >60 mL/min (ref >60)
Glucose, Bld: 103 mg/dL — ABNORMAL HIGH (ref 70–99)
Potassium: 3.8 mmol/L (ref 3.5–5.1)
SODIUM: 139 mmol/L (ref 135–145)

## 2018-11-17 MED ORDER — RISPERIDONE 2 MG PO TABS
2.0000 mg | ORAL_TABLET | Freq: Every day | ORAL | Status: DC
  Filled 2018-11-17: qty 1
  Filled 2018-11-17: qty 7
  Filled 2018-11-17: qty 1

## 2018-11-17 NOTE — Progress Notes (Signed)
Patient denies SI, HI and AVH.  Patient has participated in groups and engaged peers appropriately on the unit.    Assess patient for safety, offer medications as prescribed, engage patient  In 1:1 staff talks.   Continue to monitor as planned.  Patient able to contract me for safety.

## 2018-11-17 NOTE — [facility] Group Notes (Signed)
Adult Psychoeducational Group Note  Date:  11/17/2018 Time:  4:30 PM  Group Topic/Focus:  Wellness Toolbox:   The focus of this group is to discuss various aspects of wellness, balancing those aspects and exploring ways to increase the ability to experience wellness.  Patients will create a wellness toolbox for use upon discharge.  Participation Level:  Minimal  Participation Quality:  Resistant  Affect:  Lethargic  Cognitive:  Oriented  Insight: Improving  Engagement in Group:  Engaged  Modes of Intervention:  Clarification  Additional Comments:    Donell Beers 11/17/2018, 4:30 PM

## 2018-11-17 NOTE — Progress Notes (Signed)
Recreation Therapy Notes  Date: 1.6.20 Time: 1000 Location: 500 Hall Dayroom  Group Topic: Coping Skills  Goal Area(s) Addresses:  Pt will be able to identify positive coping skills. Pt will be able to identify benefits of using positive coping skills. Pt will be able to identify benefits of using coping skills post d/c.  Intervention: Worksheet  Activity: Coping Skills A to Z.  Patients worked together to Licensed conveyancer for each letter of the alphabet.   Education: Pharmacologist, Building control surveyor.   Education Outcome: Acknowledges understanding/In group clarification offered/Needs additional education.   Clinical Observations/Feedback: Pt did not attend group.    Caroll Rancher, LRT/CTRS         Caroll Rancher A 11/17/2018 11:45 AM

## 2018-11-17 NOTE — Progress Notes (Signed)
[facility]  11/17/2018 12:04 PM Shawn Nicholson  MRN:  [id number] Subjective:    Patient has been refusing doses of Risperdal we discussed this it is because of some sluggishness but no EPS or TD noted.  He is alert and oriented he has seemed to have had a resolution in the disorganized thought and behavior but is going to need long-term treatment so he did receive the long-acting injectable Saturday without sensitivity or toxicity.  No EPS or TD as mentioned alert and oriented to person place situation no thoughts of harming self or others denies hallucinations   principal Problem: Schizophrenia paranoid type acute exacerbation Diagnosis: Active Problems:   Schizophrenia (HCC)  Total Time spent with patient: 20 minutes   Past Medical History:  Past Medical History:  Diagnosis Date  . Allergy   . Anxiety    no meds  . Boxer's fracture    RLF  . Depression     Past Surgical History:  Procedure Laterality Date  . CLOSED REDUCTION METACARPAL WITH PERCUTANEOUS PINNING  10/23/2012   Procedure: CLOSED REDUCTION METACARPAL WITH PERCUTANEOUS PINNING;  Surgeon: Tami RibasKevin R Kuzma, MD;  Location: [facility];  Service: Orthopedics;  Laterality: Right;  . OPEN REDUCTION INTERNAL FIXATION (ORIF) METACARPAL  10/23/2012   Procedure: OPEN REDUCTION INTERNAL FIXATION (ORIF) METACARPAL;  Surgeon: Tami RibasKevin R Kuzma, MD;  Location:  SURGERY CENTER;  Service: Orthopedics;;  RIGHT SMALL AND RING FINGER ORIF VERSUS CLOSED REDUCTION PERCUTANEOUS PINNING   Family History:  Family History  Problem Relation Age of Onset  . Hypertension Father   . Arthritis Mother   . Arthritis Other   . Hypertension Other   . Cancer Neg Hx   . Alcohol abuse Neg Hx   . Depression Neg Hx   . Diabetes Neg Hx   . Drug abuse Neg Hx   . Early death Neg Hx   . Heart disease Neg Hx   . Hyperlipidemia Neg Hx   . Kidney disease Neg Hx   . Stroke Neg Hx     Social History:  Social History    Substance and Sexual Activity  Alcohol Use Yes  . Alcohol/week: 3.0 standard drinks  . Types: 3 Cans of beer per week   Comment: social     Social History   Substance and Sexual Activity  Drug Use Yes  . Types: Marijuana    Social History   Socioeconomic History  . Marital status: Single    Spouse name: Not on file  . Number of children: Not on file  . Years of education: Not on file  . Highest education level: Not on file  Occupational History  . Not on file  Social Needs  . Financial resource strain: Not on file  . Food insecurity:    Worry: Not on file    Inability: Not on file  . Transportation needs:    Medical: Not on file    Non-medical: Not on file  Tobacco Use  . Smoking status: Light Tobacco Smoker    Types: Cigarettes  . Smokeless tobacco: Never Used  Substance and Sexual Activity  . Alcohol use: Yes    Alcohol/week: 3.0 standard drinks    Types: 3 Cans of beer per week    Comment: social  . Drug use: Yes    Types: Marijuana  . Sexual activity: Not Currently  Lifestyle  . Physical activity:    Days per week: Not on file  Minutes per session: Not on file  . Stress: Not on file  Relationships  . Social connections:    Talks on phone: Not on file    Gets together: Not on file    Attends religious service: Not on file    Active member of club or organization: Not on file    Attends meetings of clubs or organizations: Not on file    Relationship status: Not on file  Other Topics Concern  . Not on file  Social History Narrative  . Not on file   Additional Social History:    Pain Medications: see MAR Prescriptions: see MAR Over the Counter: see MAR History of alcohol / drug use?: Yes Longest period of sobriety (when/how long): none reported Name of Substance 1: Marijuana(pt stated that he really did not want to talk about his use) 1 - Age of First Use: unable to assess due to patient's paranoia 1 - Amount (size/oz): unable to assess due  to patient's paranoia 1 - Frequency: daily 1 - Duration: unable to assess due to patient's paranoia 1 - Last Use / Amount: unable to assess due to patient's paranoia                  Sleep: Good  Appetite:  Good  Current Medications: Current Facility-Administered Medications  Medication Dose Route Frequency Provider Last Rate Last Dose  . acetaminophen (TYLENOL) tablet 650 mg  650 mg Oral Q6H PRN Kerry Hough, PA-C      . benztropine (COGENTIN) tablet 0.5 mg  0.5 mg Oral BID Malvin Johns, MD   Stopped at 11/15/18 1644  . feeding supplement (ENSURE ENLIVE) (ENSURE ENLIVE) liquid 237 mL  237 mL Oral BID BM Malvin Johns, MD   237 mL at 11/16/18 1339  . hydrOXYzine (ATARAX/VISTARIL) tablet 50 mg  50 mg Oral Q6H PRN Kerry Hough, PA-C      . LORazepam (ATIVAN) tablet 2 mg  2 mg Oral Q6H PRN Malvin Johns, MD       Or  . LORazepam (ATIVAN) injection 2 mg  2 mg Intramuscular Q6H PRN Malvin Johns, MD      . omega-3 acid ethyl esters (LOVAZA) capsule 1 g  1 g Oral BID Malvin Johns, MD   1 g at 11/17/18 0753  . prenatal multivitamin tablet 1 tablet  1 tablet Oral Q1200 Malvin Johns, MD   1 tablet at 11/16/18 1337  . [START ON 11/18/2018] risperiDONE (RISPERDAL) tablet 2 mg  2 mg Oral QHS Malvin Johns, MD      . risperiDONE microspheres (RISPERDAL CONSTA) injection 25 mg  25 mg Intramuscular Q14 Days Antonieta Pert, MD   25 mg at 11/15/18 1515  . traZODone (DESYREL) tablet 100 mg  100 mg Oral QHS,MR X 1 Kerry Hough, PA-C        Lab Results:  Results for orders placed or performed during the hospital encounter of 11/13/18 (from the past 48 hour(s))  Basic metabolic panel     Status: Abnormal   Collection Time: 11/17/18  6:34 AM  Result Value Ref Range   Sodium 139 135 - 145 mmol/L   Potassium 3.8 3.5 - 5.1 mmol/L   Chloride 106 98 - 111 mmol/L   CO2 25 22 - 32 mmol/L   Glucose, Bld 103 (H) 70 - 99 mg/dL   BUN 15 6 - 20 mg/dL   Creatinine, Ser 4.03 0.61 - 1.24 mg/dL    Calcium 9.0 8.9 - 10.3  mg/dL   GFR calc non Af Amer >60 >60 mL/min   GFR calc Af Amer >60 >60 mL/min   Anion gap 8 5 - 15    Comment: Performed at [facility], [address] [place], [place] [address]    Blood Alcohol level:  Lab Results  Component Value Date   [facility] <10 11/13/2018   ETH <10 12/03/2017    Metabolic Disorder Labs: Lab Results  Component Value Date   HGBA1C 5.0 11/13/2018   MPG 96.8 11/13/2018   No results found for: PROLACTIN Lab Results  Component Value Date   CHOL 155 11/13/2018   TRIG 34 11/13/2018   HDL 36 (L) 11/13/2018   CHOLHDL 4.3 11/13/2018   VLDL 7 11/13/2018   LDLCALC 112 (H) 11/13/2018   LDLCALC 61 05/25/2015    Physical Findings: AIMS: Facial and Oral Movements Muscles of Facial Expression: None, normal Lips and Perioral Area: None, normal Jaw: None, normal Tongue: None, normal,Extremity Movements Upper (arms, wrists, hands, fingers): None, normal Lower (legs, knees, ankles, toes): None, normal, Trunk Movements Neck, shoulders, hips: None, normal, Overall Severity Severity of abnormal movements (highest score from questions above): None, normal Incapacitation due to abnormal movements: None, normal Patient's awareness of abnormal movements (rate only patient's report): No Awareness, Dental Status Current problems with teeth and/or dentures?: No Does patient usually wear dentures?: No  CIWA:  CIWA-Ar Total: 0 COWS:  COWS Total Score: 0  Musculoskeletal: Strength & Muscle Tone: within normal limits Gait & Station: normal Patient leans: N/A  Psychiatric Specialty Exam: Physical Exam  ROS  Blood pressure 129/86, pulse 83, temperature 98.5 F (36.9 C), temperature source Oral, resp. rate 18, height 6\' 5"  (1.956 m), weight 79.8 kg, SpO2 100 %.Body mass index is 20.87 kg/m.  General Appearance: Casual  Eye Contact:  Good  Speech:  Clear and Coherent  Volume:  Normal  Mood:  Euthymic  Affect:  Congruent   Thought Process:  Goal Directed  Orientation:  Full (Time, Place, and Person)  Thought Content:  Tangential  Suicidal Thoughts:  No  Homicidal Thoughts:  No  Memory:  Immediate;   Good  Judgement:  Good  Insight:  Good  Psychomotor Activity:  Normal  Concentration:  Concentration: Good  Recall:  Good  Fund of Knowledge:  Good  Language:  Good  Akathisia:  Negative  Handed:  Right  AIMS (if indicated):     Assets:  Physical Health  ADL's:  Intact  Cognition:  WNL  Sleep:  Number of Hours: 6.75     Treatment Plan Summary: Daily contact with patient to assess and evaluate symptoms and progress in treatment, Medication management and Plan Reduce oral Risperdal continue long-acting injectable continue reality based therapies may go tomorrow  Malvin Johns, MD 11/17/2018, 12:04 PM

## 2018-11-18 NOTE — [facility] Suicide Risk Assessment (Signed)
[facility] INPATIENT:  Family/Significant Other Suicide Prevention Education  Suicide Prevention Education:  Contact Attempts: mother, Darrold Junker [phone]) has been identified by the patient as the family member/significant other with whom the patient will be residing, and identified as the person(s) who will aid the patient in the event of a mental health crisis.  With written consent from the patient, two attempts were made to provide suicide prevention education, prior to and/or following the patient's discharge.  We were unsuccessful in providing suicide prevention education.  A suicide education pamphlet was given to the patient to share with family/significant other.  Date and time of first attempt: 11/18/2018 / 1:30pm The call went to voicemail. Voicemail did not identify owner by name. CSW left HIPPA compliant VM with callback information.  Date and time of second attempt: to be completed at a later time  Darreld Mclean 11/18/2018, 1:29 PM

## 2018-11-18 NOTE — Progress Notes (Signed)
Dar Note: Patient is in bed sleeping with respiration regular and unlabored.  Routine safety checks maintained every 15 minutes.  Patient is safe on the unit.

## 2018-11-18 NOTE — [facility] Suicide Risk Assessment (Signed)
[facility] INPATIENT:  Family/Significant Other Suicide Prevention Education  Suicide Prevention Education:  Education Completed; Shawn Nicholson [phone]) has been identified by the patient as the family member/significant other with whom the patient will be residing, and identified as the person(s) who will aid the patient in the event of a mental health crisis (suicidal ideations/suicide attempt).  With written consent from the patient, the family member/significant other has been provided the following suicide prevention education, prior to the and/or following the discharge of the patient.  The suicide prevention education provided includes the following:  Suicide risk factors  Suicide prevention and interventions  National Suicide Hotline telephone number  [facility] assessment telephone number  [facility] Emergency Assistance 911  [facility] and/or Residential Mobile Crisis Unit telephone number  Request made of family/significant other to:  Remove weapons (e.g., guns, rifles, knives), all items previously/currently identified as safety concern.    Remove drugs/medications (over-the-counter, prescriptions, illicit drugs), all items previously/currently identified as a safety concern.  The family member/significant other verbalizes understanding of the suicide prevention education information provided.  The family member/significant other agrees to remove the items of safety concern listed above.  Mother shares that she spoke with patient yesterday and he is doing much better. Her primary concerns are that the patient will isolate himself and she notes that groups have been very beneficial. CSW will provide printed information for [facility] [place] groups for patient and family at discharge, should patient wish to follow up.  Shawn Nicholson 11/18/2018, 3:07 PM

## 2018-11-18 NOTE — Progress Notes (Signed)
[facility] MD Progress Note  11/18/2018 10:26 AM Shawn Nicholson  MRN:  [id number] Subjective:   Patient is pacing some but there is no evidence of akathisia he is alert and oriented and generally cooperative he continues to deny all positive symptoms such as hallucinations and no delusional thought he certainly more organized and he understands the risk benefits side effects of long-acting injectable.  No EPS or TD continue Risperdal he is more compliant at this point  Principal Problem: Chronic paranoid schizophrenia Diagnosis: Active Problems:   Schizophrenia (HCC)  Total Time spent with patient: 20 minutes  Past Medical History:  Past Medical History:  Diagnosis Date  . Allergy   . Anxiety    no meds  . Boxer's fracture    RLF  . Depression     Past Surgical History:  Procedure Laterality Date  . CLOSED REDUCTION METACARPAL WITH PERCUTANEOUS PINNING  10/23/2012   Procedure: CLOSED REDUCTION METACARPAL WITH PERCUTANEOUS PINNING;  Surgeon: Tami Ribas, MD;  Location: [facility];  Service: Orthopedics;  Laterality: Right;  . OPEN REDUCTION INTERNAL FIXATION (ORIF) METACARPAL  10/23/2012   Procedure: OPEN REDUCTION INTERNAL FIXATION (ORIF) METACARPAL;  Surgeon: Tami Ribas, MD;  Location: [facility];  Service: Orthopedics;;  RIGHT SMALL AND RING FINGER ORIF VERSUS CLOSED REDUCTION PERCUTANEOUS PINNING   Family History:  Family History  Problem Relation Age of Onset  . Hypertension Father   . Arthritis Mother   . Arthritis Other   . Hypertension Other   . Cancer Neg Hx   . Alcohol abuse Neg Hx   . Depression Neg Hx   . Diabetes Neg Hx   . Drug abuse Neg Hx   . Early death Neg Hx   . Heart disease Neg Hx   . Hyperlipidemia Neg Hx   . Kidney disease Neg Hx   . Stroke Neg Hx   Social History:  Social History   Substance and Sexual Activity  Alcohol Use Yes  . Alcohol/week: 3.0 standard drinks  . Types: 3 Cans of beer per week   Comment:  social     Social History   Substance and Sexual Activity  Drug Use Yes  . Types: Marijuana    Social History   Socioeconomic History  . Marital status: Single    Spouse name: Not on file  . Number of children: Not on file  . Years of education: Not on file  . Highest education level: Not on file  Occupational History  . Not on file  Social Needs  . Financial resource strain: Not on file  . Food insecurity:    Worry: Not on file    Inability: Not on file  . Transportation needs:    Medical: Not on file    Non-medical: Not on file  Tobacco Use  . Smoking status: Light Tobacco Smoker    Types: Cigarettes  . Smokeless tobacco: Never Used  Substance and Sexual Activity  . Alcohol use: Yes    Alcohol/week: 3.0 standard drinks    Types: 3 Cans of beer per week    Comment: social  . Drug use: Yes    Types: Marijuana  . Sexual activity: Not Currently  Lifestyle  . Physical activity:    Days per week: Not on file    Minutes per session: Not on file  . Stress: Not on file  Relationships  . Social connections:    Talks on phone: Not on file  Gets together: Not on file    Attends religious service: Not on file    Active member of club or organization: Not on file    Attends meetings of clubs or organizations: Not on file    Relationship status: Not on file  Other Topics Concern  . Not on file  Social History Narrative  . Not on file   Additional Social History:    Pain Medications: see MAR Prescriptions: see MAR Over the Counter: see MAR History of alcohol / drug use?: Yes Longest period of sobriety (when/how long): none reported Name of Substance 1: Marijuana(pt stated that he really did not want to talk about his use) 1 - Age of First Use: unable to assess due to patient's paranoia 1 - Amount (size/oz): unable to assess due to patient's paranoia 1 - Frequency: daily 1 - Duration: unable to assess due to patient's paranoia 1 - Last Use / Amount: unable to  assess due to patient's paranoia                  Sleep: Good  Appetite:  Good  Current Medications: Current Facility-Administered Medications  Medication Dose Route Frequency Provider Last Rate Last Dose  . acetaminophen (TYLENOL) tablet 650 mg  650 mg Oral Q6H PRN Kerry HoughSimon, Spencer E, PA-C      . benztropine (COGENTIN) tablet 0.5 mg  0.5 mg Oral BID Malvin JohnsFarah, Nancyjo Givhan, MD   Stopped at 11/15/18 1644  . feeding supplement (ENSURE ENLIVE) (ENSURE ENLIVE) liquid 237 mL  237 mL Oral BID BM Malvin JohnsFarah, Shuntel Fishburn, MD   237 mL at 11/16/18 1339  . hydrOXYzine (ATARAX/VISTARIL) tablet 50 mg  50 mg Oral Q6H PRN Kerry HoughSimon, Spencer E, PA-C      . LORazepam (ATIVAN) tablet 2 mg  2 mg Oral Q6H PRN Malvin JohnsFarah, Chavie Kolinski, MD       Or  . LORazepam (ATIVAN) injection 2 mg  2 mg Intramuscular Q6H PRN Malvin JohnsFarah, Bashir Marchetti, MD      . omega-3 acid ethyl esters (LOVAZA) capsule 1 g  1 g Oral BID Malvin JohnsFarah, Willeen Novak, MD   1 g at 11/18/18 0743  . prenatal multivitamin tablet 1 tablet  1 tablet Oral Q1200 Malvin JohnsFarah, Amily Depp, MD   1 tablet at 11/17/18 1855  . risperiDONE (RISPERDAL) tablet 2 mg  2 mg Oral QHS Malvin JohnsFarah, Aria Pickrell, MD      . risperiDONE microspheres (RISPERDAL CONSTA) injection 25 mg  25 mg Intramuscular Q14 Days Antonieta Pertlary, Greg Lawson, MD   25 mg at 11/15/18 1515  . traZODone (DESYREL) tablet 100 mg  100 mg Oral QHS,MR X 1 Kerry HoughSimon, Spencer E, PA-C        Lab Results:  Results for orders placed or performed during the hospital encounter of 11/13/18 (from the past 48 hour(s))  Basic metabolic panel     Status: Abnormal   Collection Time: 11/17/18  6:34 AM  Result Value Ref Range   Sodium 139 135 - 145 mmol/L   Potassium 3.8 3.5 - 5.1 mmol/L   Chloride 106 98 - 111 mmol/L   CO2 25 22 - 32 mmol/L   Glucose, Bld 103 (H) 70 - 99 mg/dL   BUN 15 6 - 20 mg/dL   Creatinine, Ser 1.610.93 0.61 - 1.24 mg/dL   Calcium 9.0 8.9 - 09.610.3 mg/dL   GFR calc non Af Amer >60 >60 mL/min   GFR calc Af Amer >60 >60 mL/min   Anion gap 8 5 - 15    Comment: Performed at  [facility], [address] [place], [place] [address]    Blood Alcohol level:  Lab Results  Component Value Date   [facility] <10 11/13/2018   ETH <10 12/03/2017    Metabolic Disorder Labs: Lab Results  Component Value Date   HGBA1C 5.0 11/13/2018   MPG 96.8 11/13/2018   No results found for: PROLACTIN Lab Results  Component Value Date   CHOL 155 11/13/2018   TRIG 34 11/13/2018   HDL 36 (L) 11/13/2018   CHOLHDL 4.3 11/13/2018   VLDL 7 11/13/2018   LDLCALC 112 (H) 11/13/2018   LDLCALC 61 05/25/2015    Physical Findings: AIMS: Facial and Oral Movements Muscles of Facial Expression: None, normal Lips and Perioral Area: None, normal Jaw: None, normal Tongue: None, normal,Extremity Movements Upper (arms, wrists, hands, fingers): None, normal Lower (legs, knees, ankles, toes): None, normal, Trunk Movements Neck, shoulders, hips: None, normal, Overall Severity Severity of abnormal movements (highest score from questions above): None, normal Incapacitation due to abnormal movements: None, normal Patient's awareness of abnormal movements (rate only patient's report): No Awareness, Dental Status Current problems with teeth and/or dentures?: No Does patient usually wear dentures?: No  CIWA:  CIWA-Ar Total: 0 COWS:  COWS Total Score: 0  Musculoskeletal: Strength & Muscle Tone: within normal limits Gait & Station: normal Patient leans: N/A  Psychiatric Specialty Exam: Physical Exam  ROS  Blood pressure 117/85, pulse 88, temperature 98.1 F (36.7 C), temperature source Oral, resp. rate 18, height 6\' 5"  (1.956 m), weight 79.8 kg, SpO2 100 %.Body mass index is 20.87 kg/m.  General Appearance: Casual  Eye Contact:  Good  Speech:  Clear and Coherent  Volume:  Normal  Mood:  Euthymic  Affect:  Appropriate  Thought Process:  Irrelevant  Orientation:  Full (Time, Place, and Person)  Thought Content:  Logical  Suicidal Thoughts:  No  Homicidal Thoughts:  No   Memory:  Immediate;   Good  Judgement:  Good  Insight:  Good  Psychomotor Activity:  Normal  Concentration:  Concentration: Good  Recall:  Good  Fund of Knowledge:  Good  Language:  Good  Akathisia:  Negative  Handed:  Right  AIMS (if indicated):     Assets:  Communication Skills Desire for Improvement  ADL's:  Intact  Cognition:  WNL  Sleep:  Number of Hours: 6.75     Treatment Plan Summary: Daily contact with patient to assess and evaluate symptoms and progress in treatment, Medication management and Plan Continue reality based therapy and current antipsychotic therapy may go in the morning has long-acting injectable on board  Alecxander Mainwaring, MD 11/18/2018, 10:26 AM

## 2018-11-18 NOTE — Progress Notes (Signed)
Recreation Therapy Notes  Date: 1.7.20 Time: 1000 Location: [address] Dayroom  Group Topic: Wellness  Goal Area(s) Addresses:  Patient will define components of whole wellness. Patient will verbalize benefit of whole wellness.  Intervention: Exercise  Activity: LRT led group in a series of stretches to loosen up the muscles.  Each patient then got the opportunity to lead the group in an exercise of their choice.  Patients could take breaks and get water as needed.  Education: Wellness, Building control surveyorDischarge Planning.   Education Outcome: Acknowledges education/In group clarification offered/Needs additional education.   Clinical Observations/Feedback: Pt did not attend group.    Caroll RancherMarjette Rito Lecomte, LRT/CTRS         Caroll RancherLindsay, Hiep Ollis A 11/18/2018 10:48 AM

## 2018-11-18 NOTE — Progress Notes (Signed)
Nursing Progress Note: 7p-7a D: Pt currently presents with a pleasant/euthymic affect and behavior. Pt states "I don't need meds. I'm fine." Interacting appropriatel with the milieu. Pt reports good sleep during the previous night with current medication regimen. Pt did attend wrap-up group.  A: Pt refused medications per providers orders. Pt's labs and vitals were monitored throughout the night. Pt supported emotionally and encouraged to express concerns and questions. Pt educated on medications.  R: Pt's safety ensured with 15 minute and environmental checks. Pt currently denies SI, HI, and AVH. Pt verbally contracts to seek staff if SI,HI, or AVH occurs and to consult with staff before acting on any harmful thoughts. Will continue to monitor.

## 2018-11-18 NOTE — Progress Notes (Signed)
Patient denies SI, HI and AVH.  Patient has participated in groups and engaged peers appropriately on the unit.    Assess patient for safety, offer medications as prescribed, engage patient  In 1:1 staff talks.   Continue to monitor as planned.  Patient able to contract me for safety.  

## 2018-11-19 MED ORDER — OMEGA-3-ACID ETHYL ESTERS 1 G PO CAPS: 1.0000 g | ORAL_CAPSULE | Freq: Two times a day (BID) | ORAL | 2 refills | Status: DC

## 2018-11-19 MED ORDER — PRENATAL MULTIVITAMIN CH: 1.0000 | ORAL_TABLET | Freq: Every day | ORAL | 2 refills | Status: DC

## 2018-11-19 MED ORDER — RISPERIDONE 2 MG PO TABS: 2.0000 mg | ORAL_TABLET | Freq: Every day | ORAL | 2 refills | Status: DC

## 2018-11-19 MED ORDER — BENZTROPINE MESYLATE 0.5 MG PO TABS: 0.5000 mg | ORAL_TABLET | Freq: Two times a day (BID) | ORAL | 2 refills | Status: DC

## 2018-11-19 MED ORDER — RISPERIDONE MICROSPHERES ER 25 MG IM SRER: 25.0000 mg | INTRAMUSCULAR | 11 refills | Status: DC

## 2018-11-19 NOTE — Therapy (Signed)
Occupational Therapy Group Note  Date:  11/19/2018 Time:  11:42 AM  Group Topic/Focus:  Stress Management  Participation Level:  Active  Participation Quality:  Appropriate  Affect:  Animated  Cognitive:  Appropriate  Insight: Improving  Engagement in Group:  Engaged  Modes of Intervention:  Activity, Discussion, Education and Socialization  Additional Comments:    S: "Listening to relaxation music helps me, also meditation"  O: Education given on stress management and healthy coping mechanisms. Pt encouraged to brainstorm with other peers and discuss what has worked in the past vs what has not. Pts further encouraged to discuss new coping stress management strategies to implement this date. Art activity made to display preferred coping mechanisms, along with incorporating the stress management outlet of coloring/art. PMR script delivered to facilitate relaxation response to help significant engagement in BADL/IADL.   A: Pt presents to group with animated affect, engaged and participatory throughout session. Pt brainstormed activities with other peers, stating music, dancing, exercise, mediation are helpful stress management strategies. Pt engaged in art activity with success, with minimal cues/instruction.   P: Pt provided with education on stress management activities to implement into daily routine. Handouts given to facilitate carryover when reintegrating into community   [name], MSOT, OTR/L [facility] OT/ Acute Relief OT PHP Office: [phone]  Dalphine HandingKaylee Rykar Lebleu 11/19/2018, 11:42 AM

## 2018-11-19 NOTE — Progress Notes (Signed)
Recreation Therapy Notes  Date: 1.8.20 Time: 1000 Location: 500 Hall Dayroom  Group Topic: Goal Setting  Goal Area(s) Addresses:  Patient will be able to identify at least 3 life goals.  Patient will be able to identify obstacles to reaching goals.  Patient will be able to identify benefit of setting life goals post d/c.   Intervention: Worksheet  Activity: Garment/textile technologist.  Patients were to set goals they want to accomplish within the next week, month, year and five years.  Patients were to also identify obstacles that would hinder reaching goals, what they need to reach goals and what they could start doing today towards their goals.  Education:  Discharge Planning, Coping Skills, Goal Planning   Education Outcome: Acknowledges Education/In Group Clarification Provided/Needs Additional Education  Clinical Observations:  Pt did not attend group.    Caroll Rancher, LRT/CTRS      Caroll Rancher A 11/19/2018 11:17 AM

## 2018-11-19 NOTE — BHH Suicide Risk Assessment (Signed)
[facility] Discharge Suicide Risk Assessment   Principal Problem: Exacerbation and underlying schizophrenia Discharge Diagnoses: Active Problems:   Schizophrenia (HCC)   Total Time spent with patient: 30 minutes  Alert and oriented and generally cooperative without thoughts of self-harm still has some negative symptoms and probably some processing slowing but no thought blocking no acute hallucinations reported no EPS or TD Mental Status Per Nursing Assessment::   On Admission:  NA  Demographic Factors:  Male  Loss Factors: Decrease in vocational status  Historical Factors: NA  Risk Reduction Factors:   Religious beliefs about death  Continued Clinical Symptoms:  Schizophrenia:   Paranoid or undifferentiated type  Cognitive Features That Contribute To Risk:  Loss of executive function    Suicide Risk:  Minimal: No identifiable suicidal ideation.  Patients presenting with no risk factors but with morbid ruminations; may be classified as minimal risk based on the severity of the depressive symptoms  Follow-up Information    [facility] Follow up on 11/26/2018.   Specialty:  Behavioral Health Why:  Your hospital follow up appointment is Wednesday, 11/26/18 at 8:00a. Please bring: photo ID, proof of insurance, social security card, and any discharge paperwork. Contact information: [address] [place] [address] [phone]           Plan Of Care/Follow-up recommendations:  Activity:  full  Franco Duley, MD 11/19/2018, 9:34 AM

## 2018-11-19 NOTE — Progress Notes (Signed)
  [facility] Discharge Plan :  Will you be returning to the same living situation after discharge:  Yes,  home At discharge, do you have transportation home?: Yes,  sister is picking up at 12pm Do you have the ability to pay for your medications: Yes,  Medicaid  Release of information consent forms completed and in the chart.  Patient to Follow up at: Follow-up Information    [facility] Follow up on 11/26/2018.   Specialty:  Behavioral Health Why:  Your hospital follow up appointment is Wednesday, 11/26/18 at 8:00a. Please bring: photo ID, proof of insurance, social security card, and any discharge paperwork. Contact information: [address] [place] [address] [phone]           Next level of care provider has [facility] Link:no  Safety Planning and Suicide Prevention discussed: Yes,  with mother, Marcelino DusterMichelle  Have you used any form of tobacco in the last 30 days? (Cigarettes, Smokeless Tobacco, Cigars, and/or Pipes): Yes  Has patient been referred to the Quitline?: Patient refused referral  Patient has been referred for addiction treatment: Yes  Darreld McleanCharlotte C Cypher Paule, LCSWA 11/19/2018, 9:26 AM

## 2018-11-19 NOTE — Discharge Summary (Signed)
Physician Discharge Summary Note  Patient:  Shawn Nicholson is an 38 y.o., male MRN:  [id number] DOB:  1981-05-11 Patient phone:  [phone] (home)  Patient address:   [address] [place] [address],  Total Time spent with patient: 30 minutes  Date of Admission:  11/13/2018 Date of Discharge: 11/19/18  Reason for Admission:   Shawn Nicholson is 28 years of age he presented with a cluster of bizarre behaviors and a decline in functioning related to noncompliance with his medication, apparently had been diagnosed with a schizophrenic condition but had been off medications for about 2 years.  During his admission he was quite vague and had more negative than positive symptoms possibly thought blocking and delayed processing but always denied hallucinations.  See the admission note.  Chances for long-term stability are compromised by chronic and daily cannabis dependency  Principal Problem: Exacerbation and underlying psychotic disorder Discharge Diagnoses: Active Problems:   Schizophrenia [facility])  Past Medical History:  Past Medical History:  Diagnosis Date  . Allergy   . Anxiety    no meds  . Boxer's fracture    RLF  . Depression     Past Surgical History:  Procedure Laterality Date  . CLOSED REDUCTION METACARPAL WITH PERCUTANEOUS PINNING  10/23/2012   Procedure: CLOSED REDUCTION METACARPAL WITH PERCUTANEOUS PINNING;  Surgeon: Tami Ribas, MD;  Location: [facility];  Service: Orthopedics;  Laterality: Right;  . OPEN REDUCTION INTERNAL FIXATION (ORIF) METACARPAL  10/23/2012   Procedure: OPEN REDUCTION INTERNAL FIXATION (ORIF) METACARPAL;  Surgeon: Tami Ribas, MD;  Location: [facility];  Service: Orthopedics;;  RIGHT SMALL AND RING FINGER ORIF VERSUS CLOSED REDUCTION PERCUTANEOUS PINNING   Family History:  Family History  Problem Relation Age of Onset  . Hypertension Father   . Arthritis Mother   . Arthritis Other   . Hypertension Other    . Cancer Neg Hx   . Alcohol abuse Neg Hx   . Depression Neg Hx   . Diabetes Neg Hx   . Drug abuse Neg Hx   . Early death Neg Hx   . Heart disease Neg Hx   . Hyperlipidemia Neg Hx   . Kidney disease Neg Hx   . Stroke Neg Hx     Social History:  Social History   Substance and Sexual Activity  Alcohol Use Yes  . Alcohol/week: 3.0 standard drinks  . Types: 3 Cans of beer per week   Comment: social     Social History   Substance and Sexual Activity  Drug Use Yes  . Types: Marijuana    Social History   Socioeconomic History  . Marital status: Single    Spouse name: Not on file  . Number of children: Not on file  . Years of education: Not on file  . Highest education level: Not on file  Occupational History  . Not on file  Social Needs  . Financial resource strain: Not on file  . Food insecurity:    Worry: Not on file    Inability: Not on file  . Transportation needs:    Medical: Not on file    Non-medical: Not on file  Tobacco Use  . Smoking status: Light Tobacco Smoker    Types: Cigarettes  . Smokeless tobacco: Never Used  Substance and Sexual Activity  . Alcohol use: Yes    Alcohol/week: 3.0 standard drinks    Types: 3 Cans of beer per week  Comment: social  . Drug use: Yes    Types: Marijuana  . Sexual activity: Not Currently  Lifestyle  . Physical activity:    Days per week: Not on file    Minutes per session: Not on file  . Stress: Not on file  Relationships  . Social connections:    Talks on phone: Not on file    Gets together: Not on file    Attends religious service: Not on file    Active member of club or organization: Not on file    Attends meetings of clubs or organizations: Not on file    Relationship status: Not on file  Other Topics Concern  . Not on file  Social History Narrative  . Not on file    Hospital Course:   Once here the patient was always pleasant and cooperative and again displayed predominantly negative symptoms  rather than positive symptoms he was always compliant with medications once they were explained to him but he was hesitant and refused some doses stating they were making him sedated or stating he did not need them early on.  We also reduced him to the minimal amount of Risperdal that we thought would be effective and he was administer long-acting injectable because of known noncompliance as an outpatient.  He was again repeatedly told to abstain from cannabis and all illicit drugs.  By the date of discharge on the eighth he was alert oriented to person place time and situation, no thoughts of harming self no thoughts of harming others and contracting fully.  He received his long-acting injectable risperidone on 1/4 is due for his next shot on around 1/25 he is also prescribed oral aripiprazole again no involuntary movements, no EPS or TD  Physical Findings: AIMS: Facial and Oral Movements Muscles of Facial Expression: None, normal Lips and Perioral Area: None, normal Jaw: None, normal Tongue: None, normal,Extremity Movements Upper (arms, wrists, hands, fingers): None, normal Lower (legs, knees, ankles, toes): None, normal, Trunk Movements Neck, shoulders, hips: None, normal, Overall Severity Severity of abnormal movements (highest score from questions above): None, normal Incapacitation due to abnormal movements: None, normal Patient's awareness of abnormal movements (rate only patient's report): No Awareness, Dental Status Current problems with teeth and/or dentures?: No Does patient usually wear dentures?: No  CIWA:  CIWA-Ar Total: 0 COWS:  COWS Total Score: 0  Musculoskeletal: Strength & Muscle Tone: within normal limits Gait & Station: normalPsychiatric Specialty Exam: Physical Exam  ROS  Blood pressure 127/83, pulse 92, temperature 98.1 F (36.7 C), temperature source Oral, resp. rate 18, height 6\' 5"  (1.956 m), weight 79.8 kg, SpO2 100 %.Body mass index is 20.87 kg/m.  General  Appearance: Casual  Eye Contact:  Good  Speech:  Clear and Coherent  Volume:  Decreased  Mood:  Euthymic  Affect:  Congruent  Thought Process:  Irrelevant  Orientation:  Full (Time, Place, and Person)  Thought Content:  Logical  Suicidal Thoughts:  No  Homicidal Thoughts:  No  Memory:  Immediate;   Fair  Judgement:  Fair  Insight:  Good  Psychomotor Activity:  Normal  Concentration:  Concentration: Fair  Recall:  Fair  Fund of Knowledge:  Good  Language:  Good  Akathisia:  Negative  Handed:  Right  AIMS (if indicated):     Assets:  Social Support  ADL's:  Intact  Cognition:  WNL  Sleep:  Number of Hours: 6.75     Have you used any form of tobacco in  the last 30 days? (Cigarettes, Smokeless Tobacco, Cigars, and/or Pipes): Yes  Has this patient used any form of tobacco in the last 30 days? (Cigarettes, Smokeless Tobacco, Cigars, and/or Pipes) Yes, No  Blood Alcohol level:  Lab Results  Component Value Date   ETH <10 11/13/2018   ETH <10 12/03/2017    Metabolic Disorder Labs:  Lab Results  Component Value Date   HGBA1C 5.0 11/13/2018   MPG 96.8 11/13/2018   No results found for: PROLACTIN Lab Results  Component Value Date   CHOL 155 11/13/2018   TRIG 34 11/13/2018   HDL 36 (L) 11/13/2018   CHOLHDL 4.3 11/13/2018   VLDL 7 11/13/2018   LDLCALC 112 (H) 11/13/2018   LDLCALC 61 05/25/2015    See Psychiatric Specialty Exam and Suicide Risk Assessment completed by Attending Physician prior to discharge.  Discharge destination:  Home  Is patient on multiple antipsychotic therapies at discharge:  No   Has Patient had three or more failed trials of antipsychotic monotherapy by history:  No  Recommended Plan for Multiple Antipsychotic Therapies: NA   Allergies as of 11/19/2018      Reactions   Other Other (See Comments)   Reaction to pecans showed up on allergy test   Penicillins Rash      Medication List    TAKE these medications     Indication   benztropine 0.5 MG tablet Commonly known as:  COGENTIN Take 1 tablet (0.5 mg total) by mouth 2 (two) times daily.  Indication:  Extrapyramidal Reaction caused by Medications   omega-3 acid ethyl esters 1 g capsule Commonly known as:  LOVAZA Take 1 capsule (1 g total) by mouth 2 (two) times daily.  Indication:  High Amount of Triglycerides in the Blood   prenatal multivitamin Tabs tablet Take 1 tablet by mouth daily at 12 noon.  Indication:  Vitamin Deficiency   risperiDONE 2 MG tablet Commonly known as:  RISPERDAL Take 1 tablet (2 mg total) by mouth at bedtime.  Indication:  Manic-Depression   risperiDONE microspheres 25 MG injection Commonly known as:  RISPERDAL CONSTA Inject 2 mLs (25 mg total) into the muscle every 14 (fourteen) days. Start taking on:  November 29, 2018  Indication:  Schizophrenia      Follow-up Information    [facility] Follow up on 11/26/2018.   Specialty:  Behavioral Health Why:  Your hospital follow up appointment is Wednesday, 11/26/18 at 8:00a. Please bring: photo ID, proof of insurance, social security card, and any discharge paperwork. [address] [place] [address] [phone]           Follow-up recommendations:  Activity:  full SignedMalvin Johns, MD 11/19/2018, 9:44 AM

## 2018-11-19 NOTE — Tx Team (Signed)
Interdisciplinary Treatment and Diagnostic Plan Update  11/19/2018 Time of Session: 9:00am Shawn Nicholson MRN: [id number]  Principal Diagnosis: <principal problem not specified>  Secondary Diagnoses: Active Problems:   Schizophrenia (HCC)   Current Medications:  Current Facility-Administered Medications  Medication Dose Route Frequency Provider Last Rate Last Dose  . acetaminophen (TYLENOL) tablet 650 mg  650 mg Oral Q6H PRN Kerry Hough, PA-C      . benztropine (COGENTIN) tablet 0.5 mg  0.5 mg Oral BID Malvin Johns, MD   0.5 mg at 11/18/18 1719  . feeding supplement (ENSURE ENLIVE) (ENSURE ENLIVE) liquid 237 mL  237 mL Oral BID BM Malvin Johns, MD   237 mL at 11/16/18 1339  . hydrOXYzine (ATARAX/VISTARIL) tablet 50 mg  50 mg Oral Q6H PRN Kerry Hough, PA-C      . LORazepam (ATIVAN) tablet 2 mg  2 mg Oral Q6H PRN Malvin Johns, MD       Or  . LORazepam (ATIVAN) injection 2 mg  2 mg Intramuscular Q6H PRN Malvin Johns, MD      . omega-3 acid ethyl esters (LOVAZA) capsule 1 g  1 g Oral BID Malvin Johns, MD   1 g at 11/18/18 1719  . prenatal multivitamin tablet 1 tablet  1 tablet Oral Q1200 Malvin Johns, MD   1 tablet at 11/18/18 1159  . risperiDONE (RISPERDAL) tablet 2 mg  2 mg Oral QHS Malvin Johns, MD      . risperiDONE microspheres (RISPERDAL CONSTA) injection 25 mg  25 mg Intramuscular Q14 Days Antonieta Pert, MD   25 mg at 11/15/18 1515  . traZODone (DESYREL) tablet 100 mg  100 mg Oral QHS,MR X 1 Simon, Spencer E, PA-C       PTA Medications: No medications prior to admission.    Patient Stressors: Medication change or noncompliance Substance abuse  Patient Strengths: Ability for insight Capable of independent living Motivation for treatment/growth  Treatment Modalities: Medication Management, Group therapy, Case management,  1 to 1 session with clinician, Psychoeducation, Recreational therapy.   Physician Treatment Plan for Primary Diagnosis: <principal problem  not specified> Long Term Goal(s):     Short Term Goals:    Medication Management: Evaluate patient's response, side effects, and tolerance of medication regimen.  Therapeutic Interventions: 1 to 1 sessions, Unit Group sessions and Medication administration.  Evaluation of Outcomes: Adequate for Discharge  Physician Treatment Plan for Secondary Diagnosis: Active Problems:   Schizophrenia (HCC)  Long Term Goal(s):     Short Term Goals:       Medication Management: Evaluate patient's response, side effects, and tolerance of medication regimen.  Therapeutic Interventions: 1 to 1 sessions, Unit Group sessions and Medication administration.  Evaluation of Outcomes: Adequate for Discharge   RN Treatment Plan for Primary Diagnosis: <principal problem not specified> Long Term Goal(s): Knowledge of disease and therapeutic regimen to maintain health will improve  Short Term Goals: Ability to verbalize frustration and anger appropriately will improve, Ability to verbalize feelings will improve, Ability to identify and develop effective coping behaviors will improve and Compliance with prescribed medications will improve  Medication Management: RN will administer medications as ordered by provider, will assess and evaluate patient's response and provide education to patient for prescribed medication. RN will report any adverse and/or side effects to prescribing provider.  Therapeutic Interventions: 1 on 1 counseling sessions, Psychoeducation, Medication administration, Evaluate responses to treatment, Monitor vital signs and CBGs as ordered, Perform/monitor CIWA, COWS, AIMS and Fall Risk screenings as  ordered, Perform wound care treatments as ordered.  Evaluation of Outcomes: Adequate for Discharge   LCSW Treatment Plan for Primary Diagnosis: <principal problem not specified> Long Term Goal(s): Safe transition to appropriate next level of care at discharge, Engage patient in therapeutic  group addressing interpersonal concerns.  Short Term Goals: Engage patient in aftercare planning with referrals and resources, Increase social support, Increase ability to appropriately verbalize feelings, Increase emotional regulation, Identify triggers associated with mental health/substance abuse issues and Increase skills for wellness and recovery  Therapeutic Interventions: Assess for all discharge needs, 1 to 1 time with Social worker, Explore available resources and support systems, Assess for adequacy in community support network, Educate family and significant other(s) on suicide prevention, Complete Psychosocial Assessment, Interpersonal group therapy.  Evaluation of Outcomes: Adequate for Discharge   Progress in Treatment: Attending groups: Yes. Participating in groups: Yes. Taking medication as prescribed: Yes. Toleration medication: Yes. Family/Significant other contact made: Yes, individual(s) contacted:  mother Patient understands diagnosis: Yes. Discussing patient identified problems/goals with staff: No. Medical problems stabilized or resolved: No. Denies suicidal/homicidal ideation: Yes. Issues/concerns per patient self-inventory: No.  New problem(s) identified: No, Describe:  none  New Short Term/Long Term Goal(s): medication management for mood stabilization; elimination of SI thoughts; development of comprehensive mental wellness/sobriety plan.  Patient Goals: Patient disoriented and vague, unable to assess a goal at this time.  Discharge Plan or Barriers: MHAG pamphlet, Mobile Crisis information, and AA/NA information provided to patient for additional community support and resources.   Reason for Continuation of Hospitalization: Anxiety Depression  Estimated Length of Stay: discharge today  Attendees: Patient: 11/19/2018 9:36 AM  Physician:  11/19/2018 9:36 AM  Nursing:  11/19/2018 9:36 AM  RN Care Manager: 11/19/2018 9:36 AM  Social Worker: Enid Cutterharlotte Xenia Nile, LCSWA  11/19/2018 9:36 AM  Recreational Therapist:  11/19/2018 9:36 AM  Other:  11/19/2018 9:36 AM  Other:  11/19/2018 9:36 AM  Other: 11/19/2018 9:36 AM    Scribe for Treatment Team: Darreld Mcleanharlotte C Jasper Ruminski, LCSWA 11/19/2018 9:36 AM

## 2021-10-30 ENCOUNTER — Ambulatory Visit (HOSPITAL_COMMUNITY)
Admission: EM | Admit: 2021-10-30 | Discharge: 2021-11-01 | Disposition: A | Discharge status: Other Acute Inpatient Hospital | Payer: No Payment, Other | Attending: Psychiatry | Admitting: Psychiatry

## 2021-10-30 DIAGNOSIS — F29 Unspecified psychosis not due to a substance or known physiological condition: Secondary | ICD-10-CM

## 2021-10-30 DIAGNOSIS — F1290 Cannabis use, unspecified, uncomplicated: Secondary | ICD-10-CM | POA: Insufficient documentation

## 2021-10-30 DIAGNOSIS — F17210 Nicotine dependence, cigarettes, uncomplicated: Secondary | ICD-10-CM | POA: Insufficient documentation

## 2021-10-30 DIAGNOSIS — Z20822 Contact with and (suspected) exposure to covid-19: Secondary | ICD-10-CM | POA: Insufficient documentation

## 2021-10-30 DIAGNOSIS — F209 Schizophrenia, unspecified: Secondary | ICD-10-CM | POA: Insufficient documentation

## 2021-10-30 DIAGNOSIS — F1090 Alcohol use, unspecified, uncomplicated: Secondary | ICD-10-CM | POA: Insufficient documentation

## 2021-10-30 DIAGNOSIS — Z79899 Other long term (current) drug therapy: Secondary | ICD-10-CM | POA: Insufficient documentation

## 2021-10-30 LAB — URINALYSIS, COMPLETE (UACMP) WITH MICROSCOPIC
Bacteria, UA: NONE SEEN
Bilirubin Urine: NEGATIVE
Glucose, UA: NEGATIVE mg/dL
Hgb urine dipstick: NEGATIVE
Ketones, ur: NEGATIVE mg/dL
Leukocytes,Ua: NEGATIVE
Nitrite: NEGATIVE
Protein, ur: NEGATIVE mg/dL
Specific Gravity, Urine: 1.025 (ref 1.005–1.030)
pH: 6 (ref 5.0–8.0)

## 2021-10-30 LAB — CBC WITH DIFFERENTIAL/PLATELET
Abs Immature Granulocytes: 0.02 10*3/uL (ref 0.00–0.07)
Basophils Absolute: 0.1 10*3/uL (ref 0.0–0.1)
Basophils Relative: 1 %
Eosinophils Absolute: 0.2 10*3/uL (ref 0.0–0.5)
Eosinophils Relative: 4 %
HCT: 39.9 % (ref 39.0–52.0)
Hemoglobin: 14.7 g/dL (ref 13.0–17.0)
Immature Granulocytes: 0 %
Lymphocytes Relative: 35 %
Lymphs Abs: 2.3 10*3/uL (ref 0.7–4.0)
MCH: 29.8 pg (ref 26.0–34.0)
MCHC: 36.8 g/dL — ABNORMAL HIGH (ref 30.0–36.0)
MCV: 80.9 fL (ref 80.0–100.0)
Monocytes Absolute: 0.6 10*3/uL (ref 0.1–1.0)
Monocytes Relative: 8 %
Neutro Abs: 3.5 10*3/uL (ref 1.7–7.7)
Neutrophils Relative %: 52 %
Platelets: 273 10*3/uL (ref 150–400)
RBC: 4.93 MIL/uL (ref 4.22–5.81)
RDW: 13.4 % (ref 11.5–15.5)
WBC: 6.8 10*3/uL (ref 4.0–10.5)
nRBC: 0 % (ref 0.0–0.2)

## 2021-10-30 LAB — COMPREHENSIVE METABOLIC PANEL
ALT: 12 U/L (ref 0–44)
AST: 18 U/L (ref 15–41)
Albumin: 4.2 g/dL (ref 3.5–5.0)
Alkaline Phosphatase: 69 U/L (ref 38–126)
Anion gap: 7 (ref 5–15)
BUN: 12 mg/dL (ref 6–20)
CO2: 26 mmol/L (ref 22–32)
Calcium: 9.2 mg/dL (ref 8.9–10.3)
Chloride: 104 mmol/L (ref 98–111)
Creatinine, Ser: 0.94 mg/dL (ref 0.61–1.24)
GFR, Estimated: >60 mL/min (ref >60)
Glucose, Bld: 78 mg/dL (ref 70–99)
Potassium: 3.7 mmol/L (ref 3.5–5.1)
Sodium: 137 mmol/L (ref 135–145)
Total Bilirubin: 0.8 mg/dL (ref 0.3–1.2)
Total Protein: 7.5 g/dL (ref 6.5–8.1)

## 2021-10-30 LAB — POCT URINE DRUG SCREEN - MANUAL ENTRY (I-SCREEN)
POC Amphetamine UR: NOT DETECTED
POC Buprenorphine (BUP): NOT DETECTED
POC Cocaine UR: NOT DETECTED
POC Marijuana UR: POSITIVE — AB
POC Methadone UR: NOT DETECTED
POC Methamphetamine UR: NOT DETECTED
POC Morphine: NOT DETECTED
POC Oxazepam (BZO): NOT DETECTED
POC Oxycodone UR: NOT DETECTED
POC Secobarbital (BAR): NOT DETECTED

## 2021-10-30 LAB — LIPID PANEL
Cholesterol: 131 mg/dL (ref 0–200)
HDL: 37 mg/dL — ABNORMAL LOW (ref >40)
LDL Cholesterol: 87 mg/dL (ref 0–99)
Total CHOL/HDL Ratio: 3.5 RATIO
Triglycerides: 36 mg/dL (ref <150)
VLDL: 7 mg/dL (ref 0–40)

## 2021-10-30 LAB — RESP PANEL BY RT-PCR (FLU A&B, COVID) ARPGX2
Influenza A by PCR: NEGATIVE
Influenza B by PCR: NEGATIVE
SARS Coronavirus 2 by RT PCR: NEGATIVE

## 2021-10-30 LAB — HIV ANTIBODY (ROUTINE TESTING W REFLEX): HIV Screen 4th Generation wRfx: NONREACTIVE

## 2021-10-30 LAB — HEMOGLOBIN A1C
Hgb A1c MFr Bld: 4.9 % (ref 4.8–5.6)
Mean Plasma Glucose: 93.93 mg/dL

## 2021-10-30 LAB — MAGNESIUM: Magnesium: 2 mg/dL (ref 1.7–2.4)

## 2021-10-30 LAB — TSH: TSH: 0.413 u[IU]/mL (ref 0.350–4.500)

## 2021-10-30 LAB — POC SARS CORONAVIRUS 2 AG -  ED: SARS Coronavirus 2 Ag: NEGATIVE

## 2021-10-30 LAB — ETHANOL: Alcohol, Ethyl (B): <10 mg/dL (ref <10)

## 2021-10-30 MED ORDER — OLANZAPINE 5 MG PO TBDP
5.0000 mg | ORAL_TABLET | Freq: Two times a day (BID) | ORAL | Status: DC
  Administered 2021-10-30 – 2021-11-01 (×4): 5 mg via ORAL
  Filled 2021-10-30 (×4): qty 1

## 2021-10-30 MED ORDER — ALUM & MAG HYDROXIDE-SIMETH 200-200-20 MG/5ML PO SUSP: 30.0000 mL | ORAL | Status: DC | PRN

## 2021-10-30 MED ORDER — TRAZODONE HCL 50 MG PO TABS
50.0000 mg | ORAL_TABLET | Freq: Every evening | ORAL | Status: DC | PRN
  Administered 2021-10-31: 21:00:00 50 mg via ORAL
  Filled 2021-10-30: qty 1

## 2021-10-30 MED ORDER — HYDROXYZINE HCL 25 MG PO TABS: 25.0000 mg | ORAL_TABLET | Freq: Three times a day (TID) | ORAL | Status: DC | PRN

## 2021-10-30 MED ORDER — MAGNESIUM HYDROXIDE 400 MG/5ML PO SUSP: 30.0000 mL | Freq: Every day | ORAL | Status: DC | PRN

## 2021-10-30 MED ORDER — ACETAMINOPHEN 325 MG PO TABS: 650.0000 mg | ORAL_TABLET | Freq: Four times a day (QID) | ORAL | Status: DC | PRN

## 2021-10-30 MED ORDER — ADULT MULTIVITAMIN W/MINERALS CH
1.0000 | ORAL_TABLET | Freq: Once | ORAL | Status: AC
  Administered 2021-10-30: 21:00:00 1 via ORAL
  Filled 2021-10-30: qty 1

## 2021-10-30 NOTE — ED Provider Notes (Signed)
Behavioral Health Admission H&P [facility] & OBS)  Date: 10/30/21 Patient Name: Shawn Nicholson MRN: [id number] Chief Complaint:  Chief Complaint  Patient presents with   IVC      Diagnoses:  Final diagnoses:  Psychosis, unspecified psychosis type ([place])    HPI: Shawn Nicholson 40 year old male patient presented to [facility] as a walk in accompanied by [employer] under IVC with complaints of "I think they brought me here because my mom thinks I need help".  Shawn Nicholson, 41 y.o., male patient seen face to face by this provider, consulted with Dr. Serafina Mitchell; and chart reviewed on 10/30/21.  Patient reports he is cant remember what his psychiatric diagnoses are. States he does not take an medications, and hasn't for a long time. Patient questioned why the police had to pick him up and bring him in. Explained he was IVC'd.   [name] for IVC is his sister Albina Billet Rembrandt).  The IVC reads: "[name] has been previously dx's with Schizophrenia according to family, he is non-compliant with his medication regiment. He has a history of mental commitments, most recent in the [place] area 2020. Family states that respondent is talking to people who are not there, asking family is they hearing things when their is not sounds/voices. [name] has been acting aggressively toward family members, showing up unannounced at family members homes. He is not eating, sleeping or tending to personal hygiene according to family. They are concerned for his well-bing and theirs as he continues to regress while off his medications and his behaviors are escalating".   During evaluation Shawn Nicholson is in sitting position in no acute distress He is disheveled and has a foul odor. He has his eyes closed and his hands on this legs in a meditating pose.  He is alert/oriented x 4; calm/cooperative. He is pleasant and smiles often. He is hyper verbal. His speech is clear, coherent, normal tone  but at a fast pace. His eye contact  is fleeting. He denies depression and has euphoric affect. He is tangential. He is grandiose at times. Discusses how he was almost a Event organiser. He is good at art, poetry, motivational speaking and Tour manager. He endorses racing thoughts. He appears to be responding to internal/external stimuli. He is looking around the room and behind his chair. States "I wonder sometimes if I am hearing things but then I might see some one look that way too when I hear or see something, so then I think it is ok".  Endorses auditory hallucinations.  States he hears voices.  He also hears animals talk to him, and a squirrel singing to him. Reports he sees images that run by him in his peripheral vision. He deny's paranoia but is delusional. He believes he talks to animals. He is hyper focused on energy, exercise and meditation. He discusses how his chakra is not lined up and he is having frequencies all over his body. Reports he lives alone and has not had power in his home for the past five days. States, "I should have stayed on top of that". Reports no concerns with appetite. States he only sleeps 4-5 hours per night, then states he doesn't remember when he has slept. He denies SI/HI. States he would only hurt someone in self defense.   Discussed inpatient psychiatric admission with patient. He agreed to admission, labwork and covid testing. He agreed to start Zyprexa 5 mg BID. States he has had medication trials of Risperdal, Risperdal  consta injections and does not want to restart those medications.      PHQ 2-9:   [place] Admission (Discharged) from OP Visit from 11/13/2018 in [place] [facility]  C-SSRS RISK CATEGORY No Risk        Total Time spent with patient: 45 minutes  Musculoskeletal  Strength & Muscle Tone: within normal limits Gait & Station: normal Patient leans: N/A  Psychiatric Specialty Exam  Presentation General Appearance: Disheveled  Eye  Contact:Fleeting  Speech:Clear and Coherent  Speech Volume:Normal  Handedness:No data recorded  Mood and Affect  Mood:Euphoric  Affect:Congruent   Thought Process  Thought Processes:Coherent  Descriptions of Associations:Tangential  Orientation:Full (Time, Place and Person)  Thought Content:Delusions; Tangential  Diagnosis of Schizophrenia or Schizoaffective disorder in past: Yes  Duration of Psychotic Symptoms: Greater than six months  Hallucinations:Hallucinations: Auditory; Visual Description of Auditory Hallucinations: hears voices and animals talking to him Description of Visual Hallucinations: sees images that flash by him  Ideas of Reference:Delusions  Suicidal Thoughts:Suicidal Thoughts: No  Homicidal Thoughts:Homicidal Thoughts: No   Sensorium  Memory:Immediate Fair; Recent Fair; Remote Fair  Judgment:Impaired  Insight:Poor   Executive Functions  Concentration:Fair  Attention Span:Fair  [place]  Language:Good   Psychomotor Activity  Psychomotor Activity:Psychomotor Activity: Normal   Assets  Assets:Communication Skills; Physical Health; Resilience   Sleep  Sleep:Sleep: Poor Number of Hours of Sleep: 2   Nutritional Assessment (For OBS and FBC admissions only) Has the patient had a weight loss or gain of 10 pounds or more in the last 3 months?: No Has the patient had a decrease in food intake/or appetite?: No Does the patient have dental problems?: No Does the patient have eating habits or behaviors that may be indicators of an eating disorder including binging or inducing vomiting?: No Has the patient recently lost weight without trying?: 2.0    Physical Exam Vitals and nursing note reviewed.  Constitutional:      Appearance: Normal appearance. He is well-developed.  HENT:     Head: Normocephalic and atraumatic.  Eyes:     General:        Right eye: No discharge.        Left eye: No discharge.      Conjunctiva/sclera: Conjunctivae normal.  Cardiovascular:     Rate and Rhythm: Normal rate.  Pulmonary:     Effort: Pulmonary effort is normal. No respiratory distress.  Musculoskeletal:        General: Normal range of motion.     Cervical back: Normal range of motion.  Skin:    General: Skin is dry.     Coloration: Skin is not jaundiced or pale.  Neurological:     Mental Status: He is alert and oriented to person, place, and time.  Psychiatric:        Attention and Perception: Attention normal. He perceives auditory and visual hallucinations.        Mood and Affect: Mood is elated.        Speech: Speech is rapid and pressured and tangential.        Behavior: Behavior is cooperative.        Thought Content: Thought content is delusional.        Cognition and Memory: Cognition normal.        Judgment: Judgment is impulsive.   Review of Systems  Constitutional: Negative.   HENT: Negative.    Eyes: Negative.   Respiratory: Negative.    Cardiovascular:  Negative.   Musculoskeletal: Negative.   Skin: Negative.   Neurological: Negative.   Psychiatric/Behavioral:  Positive for hallucinations.    Blood pressure (!) 140/92, pulse 82, temperature 98.7 F (37.1 C), temperature source Oral, resp. rate 18, SpO2 100 %. There is no height or weight on file to calculate BMI.  Past Psychiatric History: per chart review, Schizophrenia    Is the patient at risk to self? No  Has the patient been a risk to self in the past 6 months? No .    Has the patient been a risk to self within the distant past? No   Is the patient a risk to others? No   Has the patient been a risk to others in the past 6 months? No   Has the patient been a risk to others within the distant past? No   Past Medical History:  Past Medical History:  Diagnosis Date   Allergy    Anxiety    no meds   Boxer's fracture    RLF   Depression     Past Surgical History:  Procedure Laterality Date   CLOSED REDUCTION  METACARPAL WITH PERCUTANEOUS PINNING  10/23/2012   Procedure: CLOSED REDUCTION METACARPAL WITH PERCUTANEOUS PINNING;  Surgeon: Tennis Must, MD;  Location: [facility];  Service: Orthopedics;  Laterality: Right;   OPEN REDUCTION INTERNAL FIXATION (ORIF) METACARPAL  10/23/2012   Procedure: OPEN REDUCTION INTERNAL FIXATION (ORIF) METACARPAL;  Surgeon: Tennis Must, MD;  Location: [facility];  Service: Orthopedics;;  RIGHT SMALL AND RING FINGER ORIF VERSUS CLOSED REDUCTION PERCUTANEOUS PINNING    Family History:  Family History  Problem Relation Age of Onset   Hypertension Father    Arthritis Mother    Arthritis Other    Hypertension Other    Cancer Neg Hx    Alcohol abuse Neg Hx    Depression Neg Hx    Diabetes Neg Hx    Drug abuse Neg Hx    Early death Neg Hx    Heart disease Neg Hx    Hyperlipidemia Neg Hx    Kidney disease Neg Hx    Stroke Neg Hx     Social History:  Social History   Socioeconomic History   Marital status: Single    Spouse name: Not on file   Number of children: Not on file   Years of education: Not on file   Highest education level: Not on file  Occupational History   Not on file  Tobacco Use   Smoking status: Light Smoker    Types: Cigarettes   Smokeless tobacco: Never  Vaping Use   Vaping Use: Never used  Substance and Sexual Activity   Alcohol use: Yes    Alcohol/week: 3.0 standard drinks    Types: 3 Cans of beer per week    Comment: social   Drug use: Yes    Types: Marijuana   Sexual activity: Not Currently  Other Topics Concern   Not on file  Social History Narrative   Not on file   Social Determinants of Health   Financial Resource Strain: Not on file  Food Insecurity: Not on file  Transportation Needs: Not on file  Physical Activity: Not on file  Stress: Not on file  Social Connections: Not on file  Intimate Partner Violence: Not on file    SDOH:  SDOH Screenings   Alcohol Screen: Not on file   Depression [id number]): Not on Designer, industrial/product  Strain: Not on file  Food Insecurity: Not on file  Housing: Not on file  Physical Activity: Not on file  Social Connections: Not on file  Stress: Not on file  Tobacco Use: Not on file  Transportation Needs: Not on file    Last Labs:  No visits with results within 6 Month(s) from this visit.  Latest known visit with results is:  Admission on 11/13/2018, Discharged on 11/19/2018  Component Date Value Ref Range Status   WBC 11/13/2018 6.1  4.0 - 10.5 K/uL Final   RBC 11/13/2018 5.69  4.22 - 5.81 MIL/uL Final   Hemoglobin 11/13/2018 16.1  13.0 - 17.0 g/dL Final   HCT 11/13/2018 45.0  39.0 - 52.0 % Final   MCV 11/13/2018 79.1 (L)  80.0 - 100.0 fL Final   MCH 11/13/2018 28.3  26.0 - 34.0 pg Final   MCHC 11/13/2018 35.8  30.0 - 36.0 g/dL Final   RDW 11/13/2018 13.4  11.5 - 15.5 % Final   Platelets 11/13/2018 305  150 - 400 K/uL Final   nRBC 11/13/2018 0.0  0.0 - 0.2 % Final   Performed at [facility], [place] [address] [place], [place] [address]   Sodium 11/13/2018 139  135 - 145 mmol/L Final   Potassium 11/13/2018 3.3 (L)  3.5 - 5.1 mmol/L Final   Chloride 11/13/2018 100  98 - 111 mmol/L Final   CO2 11/13/2018 27  22 - 32 mmol/L Final   Glucose, Bld 11/13/2018 101 (H)  70 - 99 mg/dL Final   BUN 11/13/2018 15  6 - 20 mg/dL Final   Creatinine, Ser 11/13/2018 0.94  0.61 - 1.24 mg/dL Final   Calcium 11/13/2018 10.0  8.9 - 10.3 mg/dL Final   Total Protein 11/13/2018 8.7 (H)  6.5 - 8.1 g/dL Final   Albumin 11/13/2018 5.1 (H)  3.5 - 5.0 g/dL Final   AST 11/13/2018 22  15 - 41 U/L Final   ALT 11/13/2018 15  0 - 44 U/L Final   Alkaline Phosphatase 11/13/2018 65  38 - 126 U/L Final   Total Bilirubin 11/13/2018 1.2  0.3 - 1.2 mg/dL Final   GFR calc non Af Amer 11/13/2018 >60  >60 mL/min Final   GFR calc Af Amer 11/13/2018 >60  >60 mL/min Final   Anion gap 11/13/2018 12  5 - 15 Final   Performed at [facility], [place] [address] [place], [place] [address]   Alcohol, Ethyl (B) 11/13/2018 <10  <10 mg/dL Final   Comment: (NOTE) Lowest detectable limit for serum alcohol is 10 mg/dL. For medical purposes only. Performed at [facility], [place] [address], [place], [place] [address]    Hgb A1c MFr Bld 11/13/2018 5.0  4.8 - 5.6 % Final   Comment: (NOTE) Pre diabetes:          5.7%-6.4% Diabetes:              >6.4% Glycemic control for   <7.0% adults with diabetes    Mean Plasma Glucose 11/13/2018 96.8  mg/dL Final   Performed at [facility], [place] [address] [place], [place] [address]   Cholesterol 11/13/2018 155  0 - 200 mg/dL Final   Triglycerides 11/13/2018 34  <150 mg/dL Final   HDL 11/13/2018 36 (L)  >40 mg/dL Final   Total CHOL/HDL Ratio 11/13/2018 4.3  RATIO Final   VLDL 11/13/2018 7  0 - 40 mg/dL Final   LDL Cholesterol 11/13/2018  112 (H)  0 - 99 mg/dL Final   Comment:        Total Cholesterol/HDL:CHD Risk Coronary Heart Disease Risk Table                     Men   Women  1/2 Average Risk   3.4   3.3  Average Risk       5.0   4.4  2 X Average Risk   9.6   7.1  3 X Average Risk  23.4   11.0        Use the calculated Patient Ratio above and the CHD Risk Table to determine the patient's CHD Risk.        ATP III CLASSIFICATION (LDL):  <100     mg/dL   Optimal  100-129  mg/dL   Near or Above                    Optimal  130-159  mg/dL   Borderline  160-189  mg/dL   High  >190     mg/dL   Very High Performed at [place] [address], [place], [place] [address]    TSH 11/13/2018 0.387  0.350 - 4.500 uIU/mL Final   Comment: Performed by a 3rd Generation assay with a functional sensitivity of <=0.01 uIU/mL. Performed at [facility], [place] [address], [place], [place] [address]    Color, Urine 11/13/2018 YELLOW  YELLOW Final   APPearance 11/13/2018 CLEAR  CLEAR Final   Specific Gravity, Urine  11/13/2018 1.030  1.005 - 1.030 Final   pH 11/13/2018 6.0  5.0 - 8.0 Final   Glucose, UA 11/13/2018 NEGATIVE  NEGATIVE mg/dL Final   Hgb urine dipstick 11/13/2018 NEGATIVE  NEGATIVE Final   Bilirubin Urine 11/13/2018 NEGATIVE  NEGATIVE Final   Ketones, ur 11/13/2018 NEGATIVE  NEGATIVE mg/dL Final   Protein, ur 11/13/2018 NEGATIVE  NEGATIVE mg/dL Final   Nitrite 11/13/2018 NEGATIVE  NEGATIVE Final   Leukocytes, UA 11/13/2018 NEGATIVE  NEGATIVE Final   RBC / HPF 11/13/2018 0-5  0 - 5 RBC/hpf Final   WBC, UA 11/13/2018 0-5  0 - 5 WBC/hpf Final   Bacteria, UA 11/13/2018 NONE SEEN  NONE SEEN Final   Squamous Epithelial / LPF 11/13/2018 0-5  0 - 5 Final   Mucus 11/13/2018 PRESENT   Final   Performed at [facility], [place] [address] [place], [place] [address]   Opiates 11/13/2018 NONE DETECTED  NONE DETECTED Final   Cocaine 11/13/2018 NONE DETECTED  NONE DETECTED Final   Benzodiazepines 11/13/2018 NONE DETECTED  NONE DETECTED Final   Amphetamines 11/13/2018 NONE DETECTED  NONE DETECTED Final   Tetrahydrocannabinol 11/13/2018 POSITIVE (A)  NONE DETECTED Final   Barbiturates 11/13/2018 NONE DETECTED  NONE DETECTED Final   Comment: (NOTE) DRUG SCREEN FOR MEDICAL PURPOSES ONLY.  IF CONFIRMATION IS NEEDED FOR ANY PURPOSE, NOTIFY LAB WITHIN 5 DAYS. LOWEST DETECTABLE LIMITS FOR URINE DRUG SCREEN Drug Class                     Cutoff (ng/mL) Amphetamine and metabolites    1000 Barbiturate and metabolites    200 Benzodiazepine                 [id number] Tricyclics and metabolites     300 Opiates and metabolites        300 Cocaine and metabolites  300 THC                            50 Performed at [facility], [address] [place], [place] [address]    Sodium 11/17/2018 139  135 - 145 mmol/L Final   Potassium 11/17/2018 3.8  3.5 - 5.1 mmol/L Final   Chloride 11/17/2018 106  98 - 111 mmol/L Final   CO2 11/17/2018 25  22 - 32 mmol/L Final   Glucose,  Bld 11/17/2018 103 (H)  70 - 99 mg/dL Final   BUN 96/22/2979 15  6 - 20 mg/dL Final   Creatinine, Ser 11/17/2018 0.93  0.61 - 1.24 mg/dL Final   Calcium 89/21/1941 9.0  8.9 - 10.3 mg/dL Final   GFR calc non Af Amer 11/17/2018 >60  >60 mL/min Final   GFR calc Af Amer 11/17/2018 >60  >60 mL/min Final   Anion gap 11/17/2018 8  5 - 15 Final   Performed at [facility], [address], [place], [place] [address]    Allergies: Other and Penicillins  PTA Medications: (Not in a hospital admission)   Medical Decision Making  Patient presents under IVC.  He is tangential, delusional, and experiencing AVH. He is unable to perform daily activities such as bathing, paying bills and keeping up with finances.  His judgment and insight are poor.  He is living in a home that has not have electricity in 5 days.  Explained inpatient psychiatric admission.  Patient agreed.  Dr. Bronwen Betters consulted she assessed patient and completed first exam.  Recommendations  Based on my evaluation the patient does not appear to have an emergency medical condition.  Patient meets criteria for inpatient psychiatric admission.  He meets the criteria for the involuntary commitment to be upheld. Dr. Bronwen Betters consulted she assessed patient and completed first exam.  [facility] notified and they have no bed availability.  Social worker notified and patient has been faxed out.  Admit patient to the [facility] observation while awaiting an inpatient bed  Lab work ordered: CBC with differential, CMP, ethanol, hemoglobin A1c, HIV antibody, lipid panel, magnesium, RPR, chlamydia, TSH, U/A, UDS, COVID POC and PCR.  EKG ordered  Zyprexa 5 mg p.o. twice daily initiated.  Ardis Hughs, NP 10/30/21  4:00 PM

## 2021-10-30 NOTE — ED Notes (Signed)
Pt asleep in bed with no signs of acute distress. Respirations even and unlabored. Will continue to monitor for safety.

## 2021-10-30 NOTE — BH Assessment (Addendum)
Comprehensive Clinical Assessment (CCA) Note  10/30/2021 Shawn Nicholson  Disposition: TTS completed. Per [facility] provider Thomes Lolling, NP), patient meets criteria for inpatient psychiatric treatment. Patient to be admitted tot he [facility] until appropriate placement is sought by the Disposition LCSW.   Chief Complaint:  Chief Complaint  Patient presents with   IVC   Visit Diagnosis: Psychosis and Rule out Substance Induced Mood Disorder   Patient is a 40 y/o male. He presents to the [facility] with IVC in place. [name] is noted to be his sister Shawn Nicholson). His sister did not provide a contact number, only address ([address], [place], [place]). The IVC reads: "[name] has been previously dx's with Schizophrenia according to family, he is non-compliant with his medication regiment. He has a history of mental commitments, most recent in the [place]. Family states that respondent is talking to people who are not there, asking family is they hear ing things when their is not sounds/voices. [name] has been acting aggressively toward family members, showing up unannounced at family members homes. He is not eating, sleeping or tending to personal hygiene according to family. They are concerned for his well-bing and theirs as he continues to regress while off his medications and his behaviors are escalating".   Patient denies current suicidal ideations. Denies hx of suicidal ideations. Denies current depressive symptoms. Denies feelings of anxiety. However, does acknowledge that his mind may be racing. Denies HI. He reports a hx of aggressive behaviors only if it's to protect himself. Denies that he has a current outpatient therapist and/or psychiatrist. He was hospitalized [facility] in the past, 2020. Also, in the past prescribed Risperidone, which he stopped taking "because it made me feel drowsy". He did not take the medication for very long.    He reports  living alone in a house with no electricity x5 days. He considers his support system to be his mother. However, speaks of some discord amongst the two of them. His hobbies include poetry, motivational speaking, and Armed forces logistics/support/administrative officer.  Patient is oriented to person, place, and time. He presents has disheveled and malodorous. Grooming is poor. Speech is pressured. Thought processes are disorganized. He has flight of ideas. Denies AVH's. However, he is observed looking around the room, responding to internal stimuli. Looking behind his chair frequently. Patient also hyper focused on the following subjects: Energy, his closeness to animals, spirituality, and meditation.    CCA Screening, Triage and Referral (STR)  Patient Reported Information How did you hear about [place]? Family/Friend  What Is the Reason for Your Visit/Call Today? Pt is under IVC. Pt reports he is not sure why he is here however, reports having a conversation with his mother that was intense about why his lights were off. Pt reports he did get loud but denies harming anyone. Pt speech is tangential, pressured and presents with flight of ideas. Pt reports dx of schizophrenia, not taking medications. Denies SI, HI, AVH however he reports he can talk to animals.  How Long Has This Been Causing You Problems? 1 wk - 1 month  What Do You Feel Would Help You the Most Today? Treatment for Depression or other mood problem   Have You Recently Had Any Thoughts About Hurting Yourself? No  Are You Planning to Commit Suicide/Harm Yourself At This time? No   Have you Recently Had Thoughts About [place]? No  Are You Planning to Harm Someone at This Time? No  Explanation: No  data recorded  Have You Used Any Alcohol or Drugs in the Past 24 Hours? Yes  How Long Ago Did You Use Drugs or Alcohol? No data recorded What Did You Use and How Much? THC; today; "2 blunts"   Do You Currently Have a Therapist/Psychiatrist? No  Name of  Therapist/Psychiatrist: No data recorded  Have You Been Recently Discharged From Any Office Practice or Programs? No  Explanation of Discharge From Practice/Program: No data recorded    CCA Screening Triage Referral Assessment Type of Contact: Face-to-Face  Telemedicine Service Delivery:   Is this Initial or Reassessment? No data recorded Date Telepsych consult ordered in [facility]:  No data recorded Time Telepsych consult ordered in [facility]:  No data recorded Location of Assessment: No data recorded Provider Location: No data recorded  Collateral Involvement: No data recorded  Does Patient Have a Court Appointed Legal Guardian? No data recorded Name and Contact of Legal Guardian: No data recorded If Minor and Not Living with Parent(s), Who has Custody? No data recorded Is CPS involved or ever been involved? Never  Is APS involved or ever been involved? Never   Patient Determined To Be At Risk for Harm To Self or Others Based on Review of Patient Reported Information or Presenting Complaint? No  Method: No data recorded Availability of Means: No data recorded Intent: No data recorded Notification Required: No data recorded Additional Information for Danger to Others Potential: No data recorded Additional Comments for Danger to Others Potential: No data recorded Are There Guns or Other Weapons in Your Home? No data recorded Types of Guns/Weapons: No data recorded Are These Weapons Safely Secured?                            No data recorded Who Could Verify You Are Able To Have These Secured: No data recorded Do You Have any Outstanding Charges, Pending Court Dates, Parole/Probation? No data recorded Contacted To Inform of Risk of Harm To Self or Others: No data recorded   Does Patient Present under Involuntary Commitment? No  IVC Papers Initial File Date: No data recorded  [place] of Residence: [place]   Patient Currently Receiving the Following Services: -- (Patient denies  that he has psych services in place at this time.)   Determination of Need: Emergent (2 hours)   Options For Referral: Inpatient Hospitalization; Medication Management     CCA Biopsychosocial Patient Reported Schizophrenia/Schizoaffective Diagnosis in Past: Yes   Strengths: Motivated to seek help   Mental Health Symptoms Depression:   Difficulty Concentrating; Change in energy/activity   Duration of Depressive symptoms:  Duration of Depressive Symptoms: Greater than two weeks   Mania:   Increased Energy; Irritability; Racing thoughts; Euphoria; Change in energy/activity; Recklessness   Anxiety:    None   Psychosis:   None   Duration of Psychotic symptoms:  Duration of Psychotic Symptoms: N/A   Trauma:   None   Obsessions:   None   Compulsions:   None   Inattention:   None   Hyperactivity/Impulsivity:   None   Oppositional/Defiant Behaviors:   None   Emotional Irregularity:   None   Other Mood/Personality Symptoms:  No data recorded   Mental Status Exam Appearance and self-care  Stature:   Average   Weight:   Average weight   Clothing:   Dirty; Disheveled   Grooming:   Neglected   Cosmetic use:   None   Posture/gait:   Normal  Motor activity:   Restless   Sensorium  Attention:   Distractible   Concentration:   Scattered; Preoccupied   Orientation:   Time; Situation; Place; Person; Object   Recall/memory:   Normal   Affect and Mood  Affect:   Anxious   Mood:   Depressed   Relating  Eye contact:   None   Facial expression:   Depressed   Attitude toward examiner:   Cooperative   Thought and Language  Speech flow:  Clear and Coherent   Thought content:   Appropriate to Mood and Circumstances   Preoccupation:   None   Hallucinations:   None   Organization:  No data recorded  [employer] of Knowledge:   Average   Intelligence:   Average   Abstraction:   Normal   Judgement:    Normal   Reality Testing:   Adequate   Insight:   Lacking   Decision Making:   Confused   Social Functioning  Social Maturity:   Impulsive   Social Judgement:   Normal   Stress  Stressors:   Transitions; Other (Comment) (no electricity in his home)   Coping Ability:   Normal   Skill Deficits:   Decision making; Interpersonal; Responsibility   Supports:   Family     Religion: Religion/Spirituality Are You A Religious Person?: Yes What is Your Religious Affiliation?:  (unknown)  Leisure/Recreation: Leisure / Recreation Do You Have Hobbies?: Yes Leisure and Hobbies: Goes to museums, mountain climbing, hiking, casinos, traveling, makes music, plays music, arts & crafts, sculptures, shopping, eating out, dancing, being in the crowd, cater to women  Exercise/Diet: Exercise/Diet Do You Exercise?: No Have You Gained or Lost A Significant Amount of Weight in the Past Six Months?: No Do You Follow a Special Diet?: No Do You Have Any Trouble Sleeping?: Yes (4-5 hrs)   CCA Employment/Education Employment/Work Situation: Employment / Work Situation Employment Situation: Unemployed Patient's Job has Been Impacted by Current Illness: No Has Patient ever Been in Passenger transport manager?: No  Education: Education Is Patient Currently Attending School?: No Last Grade Completed:  (Unknown; states that he tried to transfer to [employer]" but things didn't work out.) Did Physicist, medical?: No Did You Have An Individualized Education Program (IIEP): No Did You Have Any Difficulty At [employer]?: No Patient's Education Has Been Impacted by Current Illness: No   CCA Family/Childhood History Family and Relationship History: Family history Marital status: Single Does patient have children?: No  Childhood History:  Childhood History By whom was/is the patient raised?: Other (Comment), Mother/father and step-parent, Grandparents Did patient suffer any  verbal/emotional/physical/sexual abuse as a child?: No Did patient suffer from severe childhood neglect?: No Has patient ever been sexually abused/assaulted/raped as an adolescent or adult?: No Was the patient ever a victim of a crime or a disaster?: No Witnessed domestic violence?: No Has patient been affected by domestic violence as an adult?: No  Child/Adolescent Assessment:     CCA Substance Use Alcohol/Drug Use: Alcohol / Drug Use Prescriptions: see MAR Over the Counter: see MAR History of alcohol / drug use?: Yes Longest period of sobriety (when/how long): none reported Substance #1 Name of Substance 1: THC 1 - Age of First Use: unknown 1 - Amount (size/oz): "5, 10, 2 blunts" 1 - Frequency: Patient 1 - Duration: Poor historian. Reported using THC anywhere from 2-5 years heavily. His responses were unreliable and varied 1 - Last Use / Amount: today; 2-3 blunts 1 - Method  of Aquiring: unknown 1- Route of Use: inhalation. Substance #2 Name of Substance 2: Alcohol 2 - Age of First Use: unknown 2 - Amount (size/oz): "2-3-4 large drinks" 2 - Frequency: daily 2 - Duration: unknown 2 - Last Use / Amount: "Today, this morning" 2 - Method of Aquiring: unknown                     ASAM's:  Six Dimensions of Multidimensional Assessment  Dimension 1:  Acute Intoxication and/or Withdrawal Potential:      Dimension 2:  Biomedical Conditions and Complications:      Dimension 3:  Emotional, Behavioral, or Cognitive Conditions and Complications:     Dimension 4:  Readiness to Change:     Dimension 5:  Relapse, Continued use, or Continued Problem Potential:     Dimension 6:  Recovery/Living Environment:     ASAM Severity Score:    ASAM Recommended Level of Treatment:     Substance use Disorder (SUD) Substance Use Disorder (SUD)  Checklist Symptoms of Substance Use: Continued use despite having a persistent/recurrent physical/psychological problem caused/exacerbated by  use, Persistent desire or unsuccessful efforts to cut down or control use, Presence of craving or strong urge to use, Social, occupational, recreational activities given up or reduced due to use, Recurrent use that results in a failure to fulfill major role obligations (work, school, home), Substance(s) often taken in larger amounts or over longer times than was intended, Large amounts of time spent to obtain, use or recover from the substance(s), Evidence of tolerance  Recommendations for Services/Supports/Treatments: Recommendations for Services/Supports/Treatments Recommendations For Services/Supports/Treatments: [employer] Designer, fashion/clothing Treatment), Individual Therapy, Inpatient Hospitalization, CD-IOP Intensive Chemical Dependency Program, Medication Management, Peer Support, SAIOP (Substance Abuse Intensive Outpatient Program)  Discharge Disposition:    DSM5 Diagnoses: Patient Active Problem List   Diagnosis Date Noted   Schizophrenia ([place]) 11/13/2018   Acute psychosis ([place]) 12/05/2017   Routine general medical examination at a health care facility 11/21/2012   Allergic rhinitis due to other allergen 06/01/2009     Referrals to Alternative Service(s): Referred to Alternative Service(s):   Place:   Date:   Time:    Referred to Alternative Service(s):   Place:   Date:   Time:    Referred to Alternative Service(s):   Place:   Date:   Time:    Referred to Alternative Service(s):   Place:   Date:   Time:     Waldon Merl, Counselor

## 2021-10-30 NOTE — BH Assessment (Signed)
Pt is under IVC. Pt reports he is not sure why he is here however, reports having a conversation with his mother that was intense about why his lights were off. Pt reports he did get loud but denies harming anyone. Pt speech is tangential, pressured and presents with flight of ideas. Pt reports dx of schizophrenia, not taking medications. Denies SI, HI, AVH however he reports he can talk to animals.  Pt is urgent

## 2021-10-30 NOTE — Progress Notes (Signed)
Received Shawn Nicholson this PM in the assessment room. He was cooperative with the admission process. He was taken to the OBS area, oriented and given nourishments per his request.

## 2021-10-30 NOTE — ED Notes (Signed)
Patient resting well - no sxs of distress noted - will continue to monitor for safety 

## 2021-10-31 ENCOUNTER — Other Ambulatory Visit: Payer: Self-pay

## 2021-10-31 LAB — RPR: RPR Ser Ql: NONREACTIVE

## 2021-10-31 LAB — GC/CHLAMYDIA PROBE AMP (~~LOC~~) NOT AT ARMC
Chlamydia: NEGATIVE
Comment: NEGATIVE
Comment: NORMAL
Neisseria Gonorrhea: NEGATIVE

## 2021-10-31 NOTE — Progress Notes (Signed)
Inpatient Behavioral Health Placement  Pt meets inpatient criteria per Vernard Gambles, NP. There are no appropriate beds at [facility]. Referral was sent to the following facilities;   Destination Service Provider Address Phone Fax  [facility]  [address] [place] [address] [phone]  [facility] [address]  [address] [place] [address] [phone]  [facility]  [address] [place], [place] [address] [phone]  [facility]  [address] [place], [place] [address] [phone]  [facility]  [address] [place] [address] [phone]  [facility]  [address], [place] [address] [phone]  [facility]  [address], [place] [address] [phone]  [facility]  [address] [place] [address] [phone]  [facility]  [address] [place] [address] [phone]  [facility]  [address] [place] [address] [phone]  [facility]  [address], [place] [address] [phone]    Situation ongoing,  CSW will follow up.   Maryjean Ka, MSW, LCSWA 10/31/2021  @ 12:21 AM

## 2021-10-31 NOTE — Progress Notes (Signed)
Patient denies SI, and HI this morning. Patient is pleasant and calm on the unit. Patient received scheduled medication. Patient ate breakfast and is now resting again. No objective signs of responding to internal stimuli at this time. Interactions with staff has been appropriate thus far. Nursing staff will continue to monitor.

## 2021-10-31 NOTE — ED Provider Notes (Signed)
Behavioral Health Progress Note  Date and Time: 10/31/2021 11:27 PM Name: Shawn Nicholson MRN:  [id number]  Subjective:   Shawn Nicholson, 40 y.o., male [facility] and admitted to continuous assessment while awaiting inpatient psychiatric bed placement.  Patient seen face to face by this provider and consulted with Dr. Bronwen BettersLaubach; and chart reviewed on 10/31/21.    During evaluation Shawn Nicholson is laying in his bed.  He is easily awakened.  He is alert and oriented.  He is disheveled.  He is hyperverbal. His speech is at a moderate pace and volume.  He is tangential.  He continues to endorse auditory and visual hallucinations.  He continues to believe that he is able to speak with animals.  Reports he sees images that past by his line of sight quickly.  He continues to look around the room and is responding to internal/external stimuli.  Reports he slept well last night.  He is tolerating Zyprexa with no adverse reactions.  Patient is still in agreement with the recommendation to overload patient psychiatric treatment.  Explained that [facility] had no bed availability today, it is possible they can have bed availability tomorrow if they have discharges.    Diagnosis:  Final diagnoses:  Psychosis, unspecified psychosis type (HCC)    Total Time spent with patient: 20 minutes  Past Psychiatric History: See H&P Past Medical History:  Past Medical History:  Diagnosis Date   Allergy    Anxiety    no meds   Boxer's fracture    RLF   Depression     Past Surgical History:  Procedure Laterality Date   CLOSED REDUCTION METACARPAL WITH PERCUTANEOUS PINNING  10/23/2012   Procedure: CLOSED REDUCTION METACARPAL WITH PERCUTANEOUS PINNING;  Surgeon: Tami RibasKevin R Kuzma, MD;  Location: [facility];  Service: Orthopedics;  Laterality: Right;   OPEN REDUCTION INTERNAL FIXATION (ORIF) METACARPAL  10/23/2012   Procedure: OPEN REDUCTION INTERNAL FIXATION (ORIF)  METACARPAL;  Surgeon: Tami RibasKevin R Kuzma, MD;  Location: [facility];  Service: Orthopedics;;  RIGHT SMALL AND RING FINGER ORIF VERSUS CLOSED REDUCTION PERCUTANEOUS PINNING   Family History:  Family History  Problem Relation Age of Onset   Hypertension Father    Arthritis Mother    Arthritis Other    Hypertension Other    Cancer Neg Hx    Alcohol abuse Neg Hx    Depression Neg Hx    Diabetes Neg Hx    Drug abuse Neg Hx    Early death Neg Hx    Heart disease Neg Hx    Hyperlipidemia Neg Hx    Kidney disease Neg Hx    Stroke Neg Hx    Family Psychiatric  History: See H&P Social History:  Social History   Substance and Sexual Activity  Alcohol Use Yes   Alcohol/week: 3.0 standard drinks   Types: 3 Cans of beer per week   Comment: social     Social History   Substance and Sexual Activity  Drug Use Yes   Types: Marijuana    Social History   Socioeconomic History   Marital status: Single    Spouse name: Not on file   Number of children: Not on file   Years of education: Not on file   Highest education level: Not on file  Occupational History   Not on file  Tobacco Use   Smoking status: Light Smoker    Types: Cigarettes  Smokeless tobacco: Never  Vaping Use   Vaping Use: Never used  Substance and Sexual Activity   Alcohol use: Yes    Alcohol/week: 3.0 standard drinks    Types: 3 Cans of beer per week    Comment: social   Drug use: Yes    Types: Marijuana   Sexual activity: Not Currently  Other Topics Concern   Not on file  Social History Narrative   Not on file   Social Determinants of Health   Financial Resource Strain: Not on file  Food Insecurity: Not on file  Transportation Needs: Not on file  Physical Activity: Not on file  Stress: Not on file  Social Connections: Not on file   SDOH:  SDOH Screenings   Alcohol Screen: Not on file  Depression (PHQ2-9): Not on file  Financial Resource Strain: Not on file  Food Insecurity: Not on  file  Housing: Not on file  Physical Activity: Not on file  Social Connections: Not on file  Stress: Not on file  Tobacco Use: Not on file  Transportation Needs: Not on file   Additional Social History:    Prescriptions: see MAR Over the Counter: see MAR History of alcohol / drug use?: Yes Longest period of sobriety (when/how long): none reported Name of Substance 1: THC 1 - Age of First Use: unknown 1 - Amount (size/oz): "5, 10, 2 blunts" 1 - Frequency: Patient 1 - Duration: Poor historian. Reported using THC anywhere from 2-5 years heavily. His responses were unreliable and varied 1 - Last Use / Amount: today; 2-3 blunts 1 - Method of Aquiring: unknown 1- Route of Use: inhalation. Name of Substance 2: Alcohol 2 - Age of First Use: unknown 2 - Amount (size/oz): "2-3-4 large drinks" 2 - Frequency: daily 2 - Duration: unknown 2 - Last Use / Amount: "Today, this morning" 2 - Method of Aquiring: unknown                Sleep: Good  Appetite:  Fair  Current Medications:  Current Facility-Administered Medications  Medication Dose Route Frequency Provider Last Rate Last Admin   acetaminophen (TYLENOL) tablet 650 mg  650 mg Oral Q6H PRN Revonda Humphrey, NP       alum & mag hydroxide-simeth (MAALOX/MYLANTA) 200-200-20 MG/5ML suspension 30 mL  30 mL Oral Q4H PRN Revonda Humphrey, NP       hydrOXYzine (ATARAX) tablet 25 mg  25 mg Oral TID PRN Revonda Humphrey, NP       magnesium hydroxide (MILK OF MAGNESIA) suspension 30 mL  30 mL Oral Daily PRN Revonda Humphrey, NP       OLANZapine zydis (ZYPREXA) disintegrating tablet 5 mg  5 mg Oral BID Revonda Humphrey, NP   5 mg at 10/31/21 2110   traZODone (DESYREL) tablet 50 mg  50 mg Oral QHS PRN Revonda Humphrey, NP   50 mg at 10/31/21 2110   Current Outpatient Medications  Medication Sig Dispense Refill   omega-3 acid ethyl esters (LOVAZA) 1 g capsule Take 1 capsule (1 g total) by mouth 2 (two) times daily. (Patient  taking differently: Take 1 g by mouth daily.) 60 capsule 2    Labs  Lab Results:  Admission on 10/30/2021  Component Date Value Ref Range Status   SARS Coronavirus 2 by RT PCR 10/30/2021 NEGATIVE  NEGATIVE Final   Comment: (NOTE) SARS-CoV-2 target nucleic acids are NOT DETECTED.  The SARS-CoV-2 RNA is generally detectable in upper respiratory  specimens during the acute phase of infection. The lowest concentration of SARS-CoV-2 viral copies this assay can detect is 138 copies/mL. A negative result does not preclude SARS-Cov-2 infection and should not be used as the sole basis for treatment or other patient management decisions. A negative result may occur with  improper specimen collection/handling, submission of specimen other than nasopharyngeal swab, presence of viral mutation(s) within the areas targeted by this assay, and inadequate number of viral copies(<138 copies/mL). A negative result must be combined with clinical observations, patient history, and epidemiological information. The expected result is Negative.  Fact Sheet for Patients:  EntrepreneurPulse.com.au  Fact Sheet for Healthcare Providers:  IncredibleEmployment.be  This test is no                          t yet approved or cleared by the [place] FDA and  has been authorized for detection and/or diagnosis of SARS-CoV-2 by FDA under an Emergency Use Authorization (EUA). This EUA will remain  in effect (meaning this test can be used) for the duration of the COVID-19 declaration under Section 564(b)(1) of the Act, 21 U.S.C.section 360bbb-3(b)(1), unless the authorization is terminated  or revoked sooner.       Influenza A by PCR 10/30/2021 NEGATIVE  NEGATIVE Final   Influenza B by PCR 10/30/2021 NEGATIVE  NEGATIVE Final   Comment: (NOTE) The Xpert Xpress SARS-CoV-2/FLU/RSV plus assay is intended as an aid in the diagnosis of influenza from Nasopharyngeal swab  specimens and should not be used as a sole basis for treatment. Nasal washings and aspirates are unacceptable for Xpert Xpress SARS-CoV-2/FLU/RSV testing.  Fact Sheet for Patients: EntrepreneurPulse.com.au  Fact Sheet for Healthcare Providers: IncredibleEmployment.be  This test is not yet approved or cleared by the [place] FDA and has been authorized for detection and/or diagnosis of SARS-CoV-2 by FDA under an Emergency Use Authorization (EUA). This EUA will remain in effect (meaning this test can be used) for the duration of the COVID-19 declaration under Section 564(b)(1) of the Act, 21 U.S.C. section 360bbb-3(b)(1), unless the authorization is terminated or revoked.  Performed at [facility], [place] [address], [place], [place] [address]    WBC 10/30/2021 6.8  4.0 - 10.5 K/uL Final   RBC 10/30/2021 4.93  4.22 - 5.81 MIL/uL Final   Hemoglobin 10/30/2021 14.7  13.0 - 17.0 g/dL Final   HCT 10/30/2021 39.9  39.0 - 52.0 % Final   MCV 10/30/2021 80.9  80.0 - 100.0 fL Final   MCH 10/30/2021 29.8  26.0 - 34.0 pg Final   MCHC 10/30/2021 36.8 (H)  30.0 - 36.0 g/dL Final   RDW 10/30/2021 13.4  11.5 - 15.5 % Final   Platelets 10/30/2021 273  150 - 400 K/uL Final   nRBC 10/30/2021 0.0  0.0 - 0.2 % Final   Neutrophils Relative % 10/30/2021 52  % Final   Neutro Abs 10/30/2021 3.5  1.7 - 7.7 K/uL Final   Lymphocytes Relative 10/30/2021 35  % Final   Lymphs Abs 10/30/2021 2.3  0.7 - 4.0 K/uL Final   Monocytes Relative 10/30/2021 8  % Final   Monocytes Absolute 10/30/2021 0.6  0.1 - 1.0 K/uL Final   Eosinophils Relative 10/30/2021 4  % Final   Eosinophils Absolute 10/30/2021 0.2  0.0 - 0.5 K/uL Final   Basophils Relative 10/30/2021 1  % Final   Basophils Absolute 10/30/2021 0.1  0.0 - 0.1 K/uL Final   Immature  Granulocytes 10/30/2021 0  % Final   Abs Immature Granulocytes 10/30/2021 0.02  0.00 - 0.07 K/uL Final   Performed at [facility], [place] [address], [place], [place] [address]   Sodium 10/30/2021 137  135 - 145 mmol/L Final   Potassium 10/30/2021 3.7  3.5 - 5.1 mmol/L Final   Chloride 10/30/2021 104  98 - 111 mmol/L Final   CO2 10/30/2021 26  22 - 32 mmol/L Final   Glucose, Bld 10/30/2021 78  70 - 99 mg/dL Final   Glucose reference range applies only to samples taken after fasting for at least 8 hours.   BUN 10/30/2021 12  6 - 20 mg/dL Final   Creatinine, Ser 10/30/2021 0.94  0.61 - 1.24 mg/dL Final   Calcium 10/30/2021 9.2  8.9 - 10.3 mg/dL Final   Total Protein 10/30/2021 7.5  6.5 - 8.1 g/dL Final   Albumin 10/30/2021 4.2  3.5 - 5.0 g/dL Final   AST 10/30/2021 18  15 - 41 U/L Final   ALT 10/30/2021 12  0 - 44 U/L Final   Alkaline Phosphatase 10/30/2021 69  38 - 126 U/L Final   Total Bilirubin 10/30/2021 0.8  0.3 - 1.2 mg/dL Final   GFR, Estimated 10/30/2021 >60  >60 mL/min Final   Comment: (NOTE) Calculated using the CKD-EPI Creatinine Equation (2021)    Anion gap 10/30/2021 7  5 - 15 Final   Performed at [facility] [address], [place], [place] [address]   Hgb A1c MFr Bld 10/30/2021 4.9  4.8 - 5.6 % Final   Comment: (NOTE) Pre diabetes:          5.7%-6.4%  Diabetes:              >6.4%  Glycemic control for   <7.0% adults with diabetes    Mean Plasma Glucose 10/30/2021 93.93  mg/dL Final   Performed at [facility], [place] [address] [place], [place] [address]   Magnesium 10/30/2021 2.0  1.7 - 2.4 mg/dL Final   Performed at [facility] [address] [place], [place] [address]   Alcohol, Ethyl (B) 10/30/2021 <10  <10 mg/dL Final   Comment: (NOTE) Lowest detectable limit for serum alcohol is 10 mg/dL.  For medical purposes only. Performed at [facility], [place] [address] [place],  [address]    Cholesterol 10/30/2021 131  0 - 200 mg/dL Final   Triglycerides 10/30/2021 36  <150 mg/dL Final   HDL 10/30/2021 37 (L)  >40 mg/dL Final   Total CHOL/HDL Ratio  10/30/2021 3.5  RATIO Final   VLDL 10/30/2021 7  0 - 40 mg/dL Final   LDL Cholesterol 10/30/2021 87  0 - 99 mg/dL Final   Comment:        Total Cholesterol/HDL:CHD Risk Coronary Heart Disease Risk Table                     Men   Women  1/2 Average Risk   3.4   3.3  Average Risk       5.0   4.4  2 X Average Risk   9.6   7.1  3 X Average Risk  23.4   11.0        Use the calculated Patient Ratio above and the CHD Risk Table to determine the patient's CHD Risk.        ATP III CLASSIFICATION (LDL):  <100  mg/dL   Optimal  993-716  mg/dL   Near or Above                    Optimal  130-159  mg/dL   Borderline  967-893  mg/dL   High  >810     mg/dL   Very High Performed at [facility], [address], [place], [place] [address]    TSH 10/30/2021 0.413  0.350 - 4.500 uIU/mL Final   Comment: Performed by a 3rd Generation assay with a functional sensitivity of <=0.01 uIU/mL. Performed at [facility], [address] [place], [place] [address]    Neisseria Gonorrhea 10/30/2021 Negative   Final   Chlamydia 10/30/2021 Negative   Final   Comment 10/30/2021 Normal Reference Ranger Chlamydia - Negative   Final   Comment 10/30/2021 Normal Reference Range Neisseria Gonorrhea - Negative   Final   RPR Ser Ql 10/30/2021 NON REACTIVE  NON REACTIVE Final   Performed at [facility], [address], [place], [place] [address]   Color, Urine 10/30/2021 YELLOW  YELLOW Final   APPearance 10/30/2021 CLEAR  CLEAR Final   Specific Gravity, Urine 10/30/2021 1.025  1.005 - 1.030 Final   pH 10/30/2021 6.0  5.0 - 8.0 Final   Glucose, UA 10/30/2021 NEGATIVE  NEGATIVE mg/dL Final   Hgb urine dipstick 10/30/2021 NEGATIVE  NEGATIVE Final   Bilirubin Urine 10/30/2021 NEGATIVE  NEGATIVE Final   Ketones, ur 10/30/2021 NEGATIVE  NEGATIVE mg/dL Final   Protein, ur 23/53/6144 NEGATIVE  NEGATIVE mg/dL Final   Nitrite 31/54/0086 NEGATIVE  NEGATIVE Final   Leukocytes,Ua 10/30/2021 NEGATIVE   NEGATIVE Final   Squamous Epithelial / LPF 10/30/2021 0-5  0 - 5 Final   WBC, UA 10/30/2021 0-5  0 - 5 WBC/hpf Final   RBC / HPF 10/30/2021 0-5  0 - 5 RBC/hpf Final   Bacteria, UA 10/30/2021 NONE SEEN  NONE SEEN Final   Mucus 10/30/2021 PRESENT   Final   Ca Oxalate Crys, UA 10/30/2021 PRESENT   Final   Performed at [facility], [address], [place], [place] [address]   POC Amphetamine UR 10/30/2021 None Detected  NONE DETECTED (Cut Off Level 1000 ng/mL) Final   POC Secobarbital (BAR) 10/30/2021 None Detected  NONE DETECTED (Cut Off Level 300 ng/mL) Final   POC Buprenorphine (BUP) 10/30/2021 None Detected  NONE DETECTED (Cut Off Level 10 ng/mL) Final   POC Oxazepam (BZO) 10/30/2021 None Detected  NONE DETECTED (Cut Off Level 300 ng/mL) Final   POC Cocaine UR 10/30/2021 None Detected  NONE DETECTED (Cut Off Level 300 ng/mL) Final   POC Methamphetamine UR 10/30/2021 None Detected  NONE DETECTED (Cut Off Level 1000 ng/mL) Final   POC Morphine 10/30/2021 None Detected  NONE DETECTED (Cut Off Level 300 ng/mL) Final   POC Oxycodone UR 10/30/2021 None Detected  NONE DETECTED (Cut Off Level 100 ng/mL) Final   POC Methadone UR 10/30/2021 None Detected  NONE DETECTED (Cut Off Level 300 ng/mL) Final   POC Marijuana UR 10/30/2021 Positive (A)  NONE DETECTED (Cut Off Level 50 ng/mL) Final   SARS Coronavirus 2 Ag 10/30/2021 Negative  Negative Final   HIV Screen 4th Generation wRfx 10/30/2021 Non Reactive  Non Reactive Final   Performed at [facility], [address], [place], [place] [address]    Blood Alcohol level:  Lab Results  Component Value Date   [facility] <10 10/30/2021  ETH <10 0000000    Metabolic Disorder Labs: Lab Results  Component Value Date   HGBA1C 4.9 10/30/2021   MPG 93.93 10/30/2021   MPG 96.8 11/13/2018   No results found for: PROLACTIN Lab Results  Component Value Date   CHOL 131 10/30/2021   TRIG 36 10/30/2021   HDL 37 (L) 10/30/2021   CHOLHDL 3.5  10/30/2021   VLDL 7 10/30/2021   LDLCALC 87 10/30/2021   LDLCALC 112 (H) 11/13/2018    Therapeutic Lab Levels: No results found for: LITHIUM No results found for: VALPROATE No components found for:  CBMZ  Physical Findings   AIMS    Flowsheet Row Admission (Discharged) from OP Visit from 11/13/2018 in [facility]  AIMS Total Score 0      AUDIT    Flowsheet Row Admission (Discharged) from OP Visit from 11/13/2018 in [facility]  Alcohol Use Disorder Identification Test Final Score (AUDIT) 0      PHQ2-9    [place] Office Visit from 05/25/2015 in [facility]  PHQ-2 Total Score 0      [place] [facility] from 10/30/2021 in [facility] Admission (Discharged) from OP Visit from 11/13/2018 in [facility]  C-SSRS RISK CATEGORY No Risk No Risk        Musculoskeletal  Strength & Muscle Tone: within normal limits Gait & Station: normal Patient leans: N/A  Psychiatric Specialty Exam  Presentation  General Appearance: Disheveled  Eye Contact:Fleeting  Speech:Clear and Coherent; Normal Rate  Speech Volume:Normal  Handedness:Right   Mood and Affect  Mood:Euphoric  Affect:Congruent   Thought Process  Thought Processes:Coherent  Descriptions of Associations:Tangential  Orientation:Full (Time, Place and Person)  Thought Content:Logical  Diagnosis of Schizophrenia or Schizoaffective disorder in past: Yes    Hallucinations:Hallucinations: Auditory Description of Auditory Hallucinations: hears voices and animals talking to hom Description of Visual Hallucinations: sees imagines  Ideas of Reference:Delusions  Suicidal Thoughts:Suicidal Thoughts: No  Homicidal Thoughts:Homicidal Thoughts: No   Sensorium  Memory:Recent Good; Immediate Good; Remote Good  Judgment:Impaired  Insight:Poor   Executive  Functions  Concentration:Fair  Attention Span:Fair  [place]   Psychomotor Activity  Psychomotor Activity:Psychomotor Activity: Normal   Assets  Assets:Communication Skills; Desire for Improvement; Resilience; Physical Health   Sleep  Sleep:Sleep: Fair Number of Hours of Sleep: 6   Nutritional Assessment (For OBS and FBC admissions only) Has the patient had a weight loss or gain of 10 pounds or more in the last 3 months?: No Has the patient had a decrease in food intake/or appetite?: No Does the patient have dental problems?: No Does the patient have eating habits or behaviors that may be indicators of an eating disorder including binging or inducing vomiting?: No Has the patient recently lost weight without trying?: 2.0    Physical Exam  Physical Exam Vitals and nursing note reviewed.  Constitutional:      General: He is not in acute distress.    Appearance: Normal appearance. He is well-developed.  HENT:     Head: Normocephalic and atraumatic.  Eyes:     General:        Right eye: No discharge.        Left eye: No discharge.     Conjunctiva/sclera: Conjunctivae normal.  Cardiovascular:     Rate and Rhythm: Normal rate.  Pulmonary:     Effort: Pulmonary effort  is normal. No respiratory distress.  Musculoskeletal:        General: No swelling. Normal range of motion.     Cervical back: Normal range of motion.  Skin:    Coloration: Skin is not jaundiced or pale.  Neurological:     Mental Status: He is alert and oriented to person, place, and time.  Psychiatric:        Attention and Perception: He perceives auditory and visual hallucinations.        Mood and Affect: Mood is elated.        Speech: Speech is tangential.        Behavior: Behavior is cooperative.        Thought Content: Thought content is paranoid and delusional.        Cognition and Memory: Cognition normal.        Judgment: Judgment is impulsive.    Review of Systems  Constitutional: Negative.   HENT: Negative.    Eyes: Negative.   Respiratory: Negative.    Cardiovascular: Negative.   Musculoskeletal: Negative.   Skin: Negative.   Neurological: Negative.   Psychiatric/Behavioral:  Positive for hallucinations.   Blood pressure 110/74, pulse (!) 52, temperature 97.7 F (36.5 C), temperature source Oral, resp. rate 17, SpO2 100 %. There is no height or weight on file to calculate BMI.  Treatment Plan Summary: Daily contact with patient to assess and evaluate symptoms and progress in treatment and Medication management  Patient continues to be criteria for inpatient psychiatric admission.  Disposition ongoing: Patient has been faxed out.  [facility] has no available beds but will have discharges tomorrow.  They will review patient if they have bed availability  He is tolerating Zyprexa with no adverse reactions.  We will continue medication and make no other medication changes at this time  Revonda Humphrey, NP 10/31/2021 11:27 PM

## 2021-10-31 NOTE — ED Notes (Signed)
Patient is resting quietly with no sxs of distress, will continue to monitor for safety

## 2021-10-31 NOTE — ED Notes (Signed)
Pt A&O x 4, no distress noted, calm & cooperative at present.  Monitoring for safety. 

## 2021-11-01 ENCOUNTER — Encounter (HOSPITAL_COMMUNITY): Payer: Self-pay | Admitting: Emergency Medicine

## 2021-11-01 ENCOUNTER — Inpatient Hospital Stay (HOSPITAL_COMMUNITY)
Admission: AD | Admit: 2021-11-01 | Discharge: 2021-11-09 | DRG: 885 | Disposition: A | Discharge status: Home / Self Care | Payer: Federal, State, Local not specified - Other | Source: Intra-hospital | Attending: Emergency Medicine | Admitting: Emergency Medicine

## 2021-11-01 ENCOUNTER — Other Ambulatory Visit: Payer: Self-pay

## 2021-11-01 DIAGNOSIS — R400 Somnolence: Secondary | ICD-10-CM | POA: Diagnosis present

## 2021-11-01 DIAGNOSIS — Z9114 Patient's other noncompliance with medication regimen: Secondary | ICD-10-CM

## 2021-11-01 DIAGNOSIS — F1210 Cannabis abuse, uncomplicated: Secondary | ICD-10-CM | POA: Diagnosis present

## 2021-11-01 DIAGNOSIS — Z8261 Family history of arthritis: Secondary | ICD-10-CM | POA: Diagnosis not present

## 2021-11-01 DIAGNOSIS — Z880 Allergy status to penicillin: Secondary | ICD-10-CM | POA: Diagnosis not present

## 2021-11-01 DIAGNOSIS — Z8249 Family history of ischemic heart disease and other diseases of the circulatory system: Secondary | ICD-10-CM | POA: Diagnosis not present

## 2021-11-01 DIAGNOSIS — F209 Schizophrenia, unspecified: Secondary | ICD-10-CM | POA: Diagnosis present

## 2021-11-01 DIAGNOSIS — F312 Bipolar disorder, current episode manic severe with psychotic features: Secondary | ICD-10-CM | POA: Diagnosis present

## 2021-11-01 DIAGNOSIS — F250 Schizoaffective disorder, bipolar type: Secondary | ICD-10-CM | POA: Diagnosis present

## 2021-11-01 DIAGNOSIS — G4700 Insomnia, unspecified: Secondary | ICD-10-CM | POA: Diagnosis present

## 2021-11-01 DIAGNOSIS — F17210 Nicotine dependence, cigarettes, uncomplicated: Secondary | ICD-10-CM | POA: Diagnosis present

## 2021-11-01 LAB — POC SARS CORONAVIRUS 2 AG: SARSCOV2ONAVIRUS 2 AG: NEGATIVE

## 2021-11-01 MED ORDER — ACETAMINOPHEN 325 MG PO TABS: 650.0000 mg | ORAL_TABLET | Freq: Four times a day (QID) | ORAL | Status: DC | PRN

## 2021-11-01 MED ORDER — ALUM & MAG HYDROXIDE-SIMETH 200-200-20 MG/5ML PO SUSP: 30.0000 mL | ORAL | Status: DC | PRN

## 2021-11-01 MED ORDER — OLANZAPINE 10 MG PO TBDP
10.0000 mg | ORAL_TABLET | Freq: Every day | ORAL | Status: DC
  Filled 2021-11-01 (×2): qty 1

## 2021-11-01 MED ORDER — MAGNESIUM HYDROXIDE 400 MG/5ML PO SUSP: 30.0000 mL | Freq: Every day | ORAL | Status: DC | PRN

## 2021-11-01 MED ORDER — HYDROXYZINE HCL 25 MG PO TABS
25.0000 mg | ORAL_TABLET | Freq: Three times a day (TID) | ORAL | Status: DC | PRN
  Filled 2021-11-01: qty 10

## 2021-11-01 MED ORDER — TRAZODONE HCL 50 MG PO TABS
50.0000 mg | ORAL_TABLET | Freq: Every evening | ORAL | Status: DC | PRN
  Filled 2021-11-01: qty 7

## 2021-11-01 NOTE — ED Notes (Signed)
Pt is sleeping in the bed. Respirations are even and unlabored. No acute distress noted. Will continue to monitor for safety. °

## 2021-11-01 NOTE — ED Provider Notes (Addendum)
°  Shawn Nicholson, 40 y.o., male patient seen face to face by this provider, consulted with Dr. Bronwen Betters; and  chart reviewed on 11/01/21.  On evaluation Shawn Nicholson reports "doing better". Patient reports that auditory and visual hallucinations have decreased. He states that he is unable to interpret auditory hallucinations and reports visual hallucinations are of spiritual beings that glow. Patient stated that he is able to speak with animals, specifically cats, dogs, and birds. Patient reports that birds tell him about other people. Patient reported a history of multiple inpatient psychiatric admissions. He stated that upon discharge on his last inpatient stay, he was prescribed medications but did not adhere to treatment. Patient stated that he manages his symptoms with marijuana and vitamins. Medication education conducted. Patient is currently adhering to olanzapine treatment. During evaluation Shawn Nicholson is in supine position, asleep, but easily aroused.  He is alert/oriented x 4; calm/cooperative; euthymic mood congruent with affect.  Patient is speaking in a clear tone at moderate volume, and increased pace. He has good eye contact.  His thought process is delusional; He is responding to internal stimuli, darting his eyes to different areas. Thought blocking noted.  Patient denies suicidal/self-harm/homicidal ideation.  He is sleeping during night and at times during the day. He denies any further adverse effects from olanzapine. Will adjust olanzapine to be administered QHS to reduce daytime drowsiness. Patient status: IVC. He is recommended for inpatient psychiatric treatment and will transfer to [facility] once a bed is available.

## 2021-11-01 NOTE — ED Notes (Signed)
Pt sleeping at present, no distress noted.  Monitoring for safety. 

## 2021-11-01 NOTE — Progress Notes (Signed)
Patient resting on bed, awake.  Calm, conversant.  Denied SI and HI, other than to protect himself.  Stated he can hear things although he's not sure if it's the radio or something else.  State he has a history of hearing "whispers".  Also reported being able to see "shadows" and "light beams".  Denied any other complaints.  Continue to monitor for safety.

## 2021-11-01 NOTE — ED Notes (Signed)
Pt A&O x 4, resting at present, no distress noted at present.  Monitoring for safety.

## 2021-11-01 NOTE — Progress Notes (Signed)
Shawn Nicholson is a 40 y.o. male involuntarily admitted for disorganized thoughts, AVH. During admission, pt states he does not know why he is here and trying to figure out. Pt observed having obsession with religious, reports hearing spiritual things but unable to tell  what he sees. Pt has been calm and cooperative with admission process, alert and oriented x 3, denies SI/HI and contracted for safety.Consents signed, skin/belongings search completed and pt oriented to unit. Pt stable at this time. Pt given the opportunity to express concerns and ask questions. Pt given toiletries. Will continue to monitor.

## 2021-11-01 NOTE — ED Notes (Signed)
Report called to RN Erskine Squibb, [facility], rm [phone].

## 2021-11-01 NOTE — ED Notes (Signed)
Report attempted, Admit Nurse to return call.

## 2021-11-01 NOTE — Progress Notes (Signed)
Pt accepted to [facility] 403-2    Patient meets inpatient criteria per Vernard Gambles, NP   The attending provider will be Bartholomew Crews, MD  Call report to [phone]    Robie Ridge, RN @ [facility] notified.     Pt scheduled  to arrive at [facility] TODAY at 8:30 PM.    Damita Dunnings, MSW, LCSW-A  2:13 PM 11/01/2021

## 2021-11-01 NOTE — ED Notes (Signed)
[facility] transport requested to [facility], pt is IVC.

## 2021-11-01 NOTE — Tx Team (Signed)
Initial Treatment Plan 11/01/2021 11:28 PM Shawn Nicholson PVX:[id number]    PATIENT STRESSORS: Financial difficulties   Health problems   Occupational concerns   Substance abuse     PATIENT STRENGTHS: Supportive family/friends  Work skills    PATIENT IDENTIFIED PROBLEMS: Depression  Anxiety  " Find out my condition"  " Best recovery solutions"               DISCHARGE CRITERIA:  Ability to meet basic life and health needs Adequate post-discharge living arrangements Improved stabilization in mood, thinking, and/or behavior Reduction of life-threatening or endangering symptoms to within safe limits Verbal commitment to aftercare and medication compliance  PRELIMINARY DISCHARGE PLAN: Attend aftercare/continuing care group Attend PHP/IOP Outpatient therapy Return to previous living arrangement Return to previous work or school arrangements  PATIENT/FAMILY INVOLVEMENT: This treatment plan has been presented to and reviewed with the patient, Shawn Nicholson, and/or family member.  The patient and family have been given the opportunity to ask questions and make suggestions.  Bethann Punches, RN 11/01/2021, 11:28 PM

## 2021-11-01 NOTE — ED Notes (Signed)
Report Attempted, Unit reports Admission nurse to return call.

## 2021-11-01 NOTE — Progress Notes (Signed)
Patient resting in bed, eyes closed.  Compliant with medications.  Continue to monitor for safety.

## 2021-11-01 NOTE — Progress Notes (Signed)
Patient sleeping.  Respirations even and unlabored. Continue to monitor for safety. °

## 2021-11-02 DIAGNOSIS — F1210 Cannabis abuse, uncomplicated: Secondary | ICD-10-CM | POA: Diagnosis present

## 2021-11-02 DIAGNOSIS — F250 Schizoaffective disorder, bipolar type: Secondary | ICD-10-CM | POA: Diagnosis present

## 2021-11-02 MED ORDER — DIVALPROEX SODIUM 500 MG PO DR TAB
500.0000 mg | DELAYED_RELEASE_TABLET | Freq: Two times a day (BID) | ORAL | Status: DC
  Administered 2021-11-02 – 2021-11-03 (×2): 500 mg via ORAL
  Filled 2021-11-02 (×5): qty 1

## 2021-11-02 MED ORDER — OLANZAPINE 10 MG PO TABS
10.0000 mg | ORAL_TABLET | Freq: Every day | ORAL | Status: DC
  Administered 2021-11-02: 21:00:00 10 mg via ORAL
  Filled 2021-11-02 (×2): qty 1

## 2021-11-02 MED ORDER — OLANZAPINE 5 MG PO TBDP: 5.0000 mg | ORAL_TABLET | Freq: Three times a day (TID) | ORAL | Status: DC | PRN

## 2021-11-02 MED ORDER — LORAZEPAM 1 MG PO TABS: 1.0000 mg | ORAL_TABLET | ORAL | Status: DC | PRN

## 2021-11-02 MED ORDER — OLANZAPINE 5 MG PO TABS
5.0000 mg | ORAL_TABLET | Freq: Every day | ORAL | Status: DC
  Administered 2021-11-02 – 2021-11-03 (×2): 5 mg via ORAL
  Filled 2021-11-02 (×4): qty 1

## 2021-11-02 MED ORDER — ZIPRASIDONE MESYLATE 20 MG IM SOLR: 20.0000 mg | INTRAMUSCULAR | Status: DC | PRN

## 2021-11-02 MED ORDER — DIVALPROEX SODIUM ER 500 MG PO TB24
500.0000 mg | ORAL_TABLET | Freq: Two times a day (BID) | ORAL | Status: DC
  Filled 2021-11-02 (×5): qty 1

## 2021-11-02 NOTE — BHH Group Notes (Signed)
Pt did not attend morning goals group. 

## 2021-11-02 NOTE — H&P (Addendum)
Psychiatric Admission Assessment Adult  Patient Identification: Shawn Nicholson MRN:  [id number] Date of Evaluation:  11/02/2021 Chief Complaint: self-care deficits, AH Principal Diagnosis: Bipolar I, most recent episode manic, severe with psychotic behavior (HCC) Diagnosis:  Principal Problem:   Bipolar I, most recent episode manic, severe with psychotic behavior (HCC) Active Problems:   Cannabis abuse  History of Present Illness: Shawn Nicholson is a 40 yo patient w/ PPH of schizophrenia who presented to [facility] under IVC via [facility] for endorsing AH, poor hygiene, and insomnia. Patient was transferred to [facility] and IVC was continued.   On assessment this AM patient is dressed casually. Patient reports that he believes he has been transferred to [facility] for another evaluation of his mental health. Patient reports that he believes he his mother called Police because she and patient were having a verbal disagreement about "where I am in life." Patient reports that he is worried that his mother felt he was becoming physically aggressive, but patient endorses he never had any intention to become physical and thinks that their conversation may have made her a bit anxious. Patient reports that he became very passionate during theic conversation. 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 Provider again had to cut in to redirect patient. 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 Patient briefly alluded that he did not feel he needs to go to [employer] school, "because I was already learning from the people who graduated." Patient reported that the last time he delivered food was in the past week. Patient reports that now he is focusing more on helping [employer] "deal with the 5G and IT, because I love IT." Patient reports that he is a bit worried about 5G "because we are evolving into a new reality." Patient reports that he thinks technology in general can provide special communication and endorses that he believes everyone is getting special messages from television and again derails and begins talking about the Bible, [employer]ie [employer] and Able and family dynamics. Upon redirection patient reports that he believes that everyone gets special messages and this is "similar to when [employer] was alive, they used pigeons and those pigeons has magnetic forces."   Patient reports that he has been hearing command and non command hallucinations. Patient reports that the commands tell him to go certain places, but never instruct homicidal or suicidal based instructions. Patient reports that he hears the  voices both inside and outside of his mind. Patient reports that he is also seeing shadows and he believes this may be spiritual. Patient denies SI and HI. Patient endorses that he has heard "from other people in society" that he may be a father. Patient specifically recalled waking up one morning and hearing people whisper outside  his door about how he was a derelict father.  Patient does not endorse significant problems with guilt, depressed mood or sleep. Patient endorses that he has more energy than usual, poor concentration, and fluctuating appetite as he is "trying to correct my metabolism." Patient reports that he "feels anxious to achieve goals" and endorses that he really wants to go out on a "pilgrimage."  He admits to smoking up to 7 blunts of THC weekly but denies other recent alcohol or illicit drug use.  Objectively patient is extremely pressured, delusional, and psychotic. Patient is overall pleasant despite this.   Collateral: Mom- Unable to reach despite 3 calls.  Associated Signs/Symptoms: Depression Symptoms:  difficulty concentrating, anxiety, decreased appetite, Duration of Depression Symptoms: Greater than two weeks  (Hypo) Manic Symptoms:  Delusions, Distractibility, Elevated Mood, Flight of Ideas, Grandiosity, Hallucinations, Impulsivity, Anxiety Symptoms:   No Psychotic Symptoms:  Delusions, Hallucinations: Auditory Command:  go places Visual PTSD Symptoms: NA  Total Time spent with patient: 45 minutes  Past Psychiatric History: Schizophrenia dx 3 prior hospitalizations 1st at 40 yo in [place]- (d'c on Risperdal consta) Hx of poor med compliance  Is the patient at risk to self? Yes.    Has the patient been a risk to self in the past 6 months? No.  Has the patient been a risk to self within the distant past? Yes.    Is the patient a risk to others? No.  Has the patient been a risk to others in the past 6 months? No.  Has the patient been a risk to others within the distant past? No.   Alcohol Screening: 1. How often do you have a drink containing alcohol?: Monthly or less 2. How many drinks containing alcohol do you have on a typical day when you are drinking?: 3 or 4 3. How often do you have six or more drinks on one occasion?: Less than  monthly AUDIT-C Score: 3 4. How often during the last year have you found that you were not able to stop drinking once you had started?: Never 5. How often during the last year have you failed to do what was normally expected from you because of drinking?: Never 6. How often during the last year have you needed a first drink in the morning to get yourself going after a heavy drinking session?: Never 7. How often during the last year have you had a feeling of guilt of remorse after drinking?: Never 8. How often during the last year have you been unable to remember what happened the night before because you had been drinking?: Never 9. Have you or someone else been injured as a result of your drinking?: No 10. Has a relative or friend or a doctor or another health worker been concerned about your drinking or suggested you cut down?: No Alcohol Use Disorder Identification Test Final Score (AUDIT): 3  Substance Abuse History in the last 12 months:  Yes.   THC- daily use (7blunts/week with daily use)  Started at 40 yo.  EtoH : Occasionally Used Ecstacy in his [id number] Denies other illicit drug or IV drug use  Consequences of Substance Abuse: Medical Consequences:  Concerned in the past that [facility] use has contributing to psychiatric deficits.  Previous Psychotropic Medications: Yes  - Risperdal po and consta  Psychological Evaluations: Yes   Past Medical History:  Past Medical History:  Diagnosis Date   Allergy    Anxiety    no meds   Boxer's fracture    RLF   Depression     Past Surgical History:  Procedure Laterality Date   CLOSED REDUCTION METACARPAL WITH PERCUTANEOUS PINNING  10/23/2012   Procedure: CLOSED REDUCTION METACARPAL WITH PERCUTANEOUS PINNING;  Surgeon: Tennis Must, MD;  Location: [place];  Service: Orthopedics;  Laterality: Right;   OPEN REDUCTION INTERNAL FIXATION (ORIF) METACARPAL  10/23/2012   Procedure: OPEN REDUCTION INTERNAL FIXATION (ORIF)  METACARPAL;  Surgeon: Tennis Must, MD;  Location: [place];  Service: Orthopedics;;  RIGHT SMALL AND RING FINGER ORIF VERSUS CLOSED REDUCTION PERCUTANEOUS PINNING   Family History:  Family History  Problem Relation Age of Onset   Hypertension Father    Arthritis Mother    Arthritis Other    Hypertension Other    Cancer Neg Hx    Alcohol abuse Neg Hx    Depression Neg Hx    Diabetes Neg Hx    Drug abuse Neg Hx    Early death Neg Hx    Heart disease Neg Hx    Hyperlipidemia Neg Hx    Kidney disease Neg Hx    Stroke Neg Hx    Family Psychiatric  History: Brother: PTSD, Sister: bipolar  Tobacco Screening:  occasional use of Cruzen and Milds  Social History:  Social History   Substance and Sexual Activity  Alcohol Use Yes   Alcohol/week: 3.0 standard drinks   Types: 3 Cans of beer per week   Comment: social     Social History   Substance and Sexual Activity  Drug Use Yes   Types: Marijuana    Additional Social History:  - Lives alone - Patient endorses spending a lot of time gleaning knowledge from [place] if he has children   Allergies:   Allergies  Allergen Reactions   Other Other (See Comments)    Reaction to pecans showed up on allergy test   Penicillins Rash   Lab Results:  Results for orders placed or performed during the hospital encounter of 10/30/21 (from the past 48 hour(s))  POC SARS Coronavirus 2 Ag     Status: None   Collection Time: 11/01/21  1:38 PM  Result Value Ref Range   SARSCOV2ONAVIRUS 2 AG NEGATIVE NEGATIVE    Comment: (NOTE) SARS-CoV-2 antigen NOT DETECTED.   Negative results are presumptive.  Negative results do not preclude SARS-CoV-2 infection and should not be used as the sole basis for treatment or other patient management decisions, including infection  control decisions, particularly in the presence of clinical signs and  symptoms consistent with COVID-19, or in those who have been  in contact with the virus.  Negative results must be combined with clinical observations, patient history, and epidemiological information. The expected result is Negative.  Fact Sheet for Patients: HandmadeRecipes.com.cy  Fact Sheet for Healthcare Providers: FuneralLife.at  This test is not yet approved or cleared by the [place] FDA and  has been authorized for detection and/or diagnosis of SARS-CoV-2 by FDA under an Emergency Use Authorization (EUA).  This EUA will remain in effect (meaning this test can be used) for the  duration of  the COV ID-19 declaration under Section 564(b)(1) of the Act, 21 U.S.C. section 360bbb-3(b)(1), unless the authorization is terminated or revoked sooner.      Blood Alcohol level:  Lab Results  Component Value Date   ETH <10 10/30/2021   ETH <10 11/13/2018    Metabolic Disorder Labs:  Lab Results  Component Value Date   HGBA1C 4.9 10/30/2021   MPG 93.93 10/30/2021   MPG 96.8 11/13/2018   No results found for: PROLACTIN Lab Results  Component Value Date   CHOL 131 10/30/2021   TRIG 36 10/30/2021   HDL 37 (L) 10/30/2021   CHOLHDL 3.5 10/30/2021   VLDL 7 10/30/2021   LDLCALC 87 10/30/2021   LDLCALC 112 (H) 11/13/2018    Current Medications: Current Facility-Administered Medications  Medication Dose Route Frequency Provider Last Rate Last Admin   acetaminophen (TYLENOL) tablet 650 mg  650 mg Oral Q6H PRN Penn, Cicely, NP       alum & mag hydroxide-simeth (MAALOX/MYLANTA) 200-200-20 MG/5ML suspension 30 mL  30 mL Oral Q4H PRN Penn, Cicely, NP       divalproex (DEPAKOTE) DR tablet 500 mg  500 mg Oral Q12H Mason Jim, Fawna Cranmer E, MD   500 mg at 11/02/21 1210   hydrOXYzine (ATARAX) tablet 25 mg  25 mg Oral TID PRN Penn, Cranston Neighbor, NP       OLANZapine zydis (ZYPREXA) disintegrating tablet 5 mg  5 mg Oral Q8H PRN Mason Jim, Choya Tornow E, MD       And   LORazepam (ATIVAN) tablet 1 mg  1 mg Oral PRN  Comer Locket, MD       And   ziprasidone (GEODON) injection 20 mg  20 mg Intramuscular PRN Mason Jim, Kendrick Haapala E, MD       magnesium hydroxide (MILK OF MAGNESIA) suspension 30 mL  30 mL Oral Daily PRN Penn, Cranston Neighbor, NP       OLANZapine (ZYPREXA) tablet 10 mg  10 mg Oral QHS Mason Jim, Ural Acree E, MD       OLANZapine (ZYPREXA) tablet 5 mg  5 mg Oral Daily Eliseo Gum B, MD   5 mg at 11/02/21 1209   traZODone (DESYREL) tablet 50 mg  50 mg Oral QHS PRN Penn, Cranston Neighbor, NP       PTA Medications: Medications Prior to Admission  Medication Sig Dispense Refill Last Dose   omega-3 acid ethyl esters (LOVAZA) 1 g capsule Take 1 capsule (1 g total) by mouth 2 (two) times daily. (Patient taking differently: Take 1 g by mouth daily.) 60 capsule 2     Musculoskeletal: Strength & Muscle Tone: within normal limits Gait & Station: normal Patient leans: N/A  Psychiatric Specialty Exam:  Presentation  General Appearance: Disheveled appearing, casually dressed  Eye Contact:Fair  Speech:Clear and Coherent; Pressured  Speech Volume:Normal  Handedness:Right   Mood and Affect  Mood:Elevated  Affect:expansive   Thought Process  Thought Processes:Disorganized  Descriptions of Associations:Tangential  Orientation:Full (Time, Place and Person)  Thought Content:Endorses AVH, ideas of reference, and makes grandiose and delusional statements; is not grossly responding to internal/external stimuli on exam  Hallucinations:Hallucinations: Auditory; Visual Description of Auditory Hallucinations: Patient reports that he is unable to interpret voices. Description of Visual Hallucinations: spiritual beings  Ideas of Reference:Reports belief in messages from TV and 5G towers  Suicidal Thoughts:Suicidal Thoughts: No  Homicidal Thoughts:Homicidal Thoughts: No   Sensorium  Memory: Able to recall 3 items in 5 minutes  Judgment:Impaired  Insight:None   Executive Functions  Concentration:able to  calculate number of quarters in $2.75 but requires redirection frequently on exam  Attention Span: able to name months in reverse serial order except missing 1 month but requires frequent redirection on exam  Recall: Can recall 3 objects in 5 minutes  [place]  Language:Good   Psychomotor Activity  Psychomotor Activity:Psychomotor Activity: Restlessness   Assets  Assets:Communication Skills; Resilience  Physical Exam Constitutional:      Appearance: Normal appearance.  HENT:     Head: Normocephalic and atraumatic.  Eyes:     Extraocular Movements: Extraocular movements intact.  Cardiovascular:     Rate and Rhythm: Normal rate.  Pulmonary:     Effort: Pulmonary effort is normal.  Neurological:     Mental Status: He is alert and oriented to person, place, and time.   Review of Systems  Constitutional:  Negative for fever.  HENT:  Negative for congestion.   Respiratory:  Negative for cough and shortness of breath.   Cardiovascular:  Negative for chest pain.  Gastrointestinal:  Negative for diarrhea, nausea and vomiting.  Genitourinary:  Negative for frequency.  Skin:  Negative for rash.  Neurological:  Negative for headaches.  Psychiatric/Behavioral:  Positive for hallucinations. Negative for depression and suicidal ideas.   Blood pressure (!) 124/94, pulse 67, temperature 98.4 F (36.9 C), temperature source Oral, resp. rate 16, height 6\' 5"  (1.956 m), weight 81.6 kg, SpO2 100 %. Body mass index is 21.34 kg/m.  Treatment Plan Summary: Daily contact with patient to assess and evaluate symptoms and progress in treatment and Medication management  Roderick Frankum is a 41 yo patient with reported PPH of schizophrenia. On presentation appears manic moreso than psychotic from a pure thought disorder standpoint. His THC use has likely contributed to his present decompensation. Patient's presentation correlates more with Bipolar disorder. Patient has very poor  insight but is pleasant. Patient is confused as to why he needs medication to slow his rapid thoughts and appears to like being manic. Despite this patient's is willing to compliant. Patient displayed disorganized thoughts, flight of ideas, delusions of grandeur, endorsing AVH.   Labs Reviewed: UDS (+ THC), HIV (-), RPR (-) TSH- WNL, Lipid Panel- HDL 37, EtOH - (-), Mg  (WNL), A1c- WNL, CMP- WNL, CBC- WNL  EKG: NSR QTC 395  Bipolar disorder 1 Type 1, manic episode, w/ psychosis ( R/ O schizoaffective disorder, bipolar type; R/O Substance induced Psychosis/mania, R/O schizophrenia by hx) - Start Depakote 500 BID for mood stabilization  - Start Zyprexa 5mg  daily and  continue 10mg  QHS for psychosis and mood stabilization - The r/b/se/a to his medications were discussed and he consents to medication trials but will need additional psycho-education about his medications as he clears  Cannabis Use d/o - would benefit from SA treatment after discharge  Agitation Protocol: Zyprexa 5mg  q8h, Ativan 1mg , Geodon 20 PRN  Physician Treatment Plan for Primary Diagnosis: Bipolar I, most recent episode manic, severe with psychotic behavior ([place]) Long Term Goal(s): Improvement in symptoms so as ready for discharge  Short Term Goals: Ability to identify changes in lifestyle to reduce recurrence of condition will improve, Ability to verbalize feelings will improve, Ability to disclose and discuss suicidal ideas, Ability to demonstrate self-control will improve, Ability to identify and develop effective coping behaviors will improve, Ability to maintain clinical measurements within normal limits will improve, Compliance with prescribed medications will improve, and Ability to identify triggers associated with substance abuse/mental health issues will improve  Physician Treatment  Plan for Secondary Diagnosis: Principal Problem:   Bipolar I, most recent episode manic, severe with psychotic behavior ([place]) Active  Problems:   Cannabis abuse  Long Term Goal(s): Improvement in symptoms so as ready for discharge  Short Term Goals: Ability to identify changes in lifestyle to reduce recurrence of condition will improve, Ability to verbalize feelings will improve, Ability to disclose and discuss suicidal ideas, Ability to demonstrate self-control will improve, Ability to identify and develop effective coping behaviors will improve, Ability to maintain clinical measurements within normal limits will improve, Compliance with prescribed medications will improve, and Ability to identify triggers associated with substance abuse/mental health issues will improve  I certify that inpatient services furnished can reasonably be expected to improve the patient's condition.     PGY-2 Damita Dunnings, MD 12/22/20222:46 PM

## 2021-11-02 NOTE — BHH Suicide Risk Assessment (Signed)
[facility] Admission Suicide Risk Assessment   Nursing information obtained from:  Patient Demographic factors:  Male, Living alone, Low socioeconomic status Current Mental Status:  mania, AH Loss Factors:  Financial problems / change in socioeconomic status Historical Factors:  Impulsivity, previous psychiatric diagnoses/treatments, substance use prior to admission Risk Reduction Factors:  Employed, positive social supports  Total Time Spent in Direct Patient Care:  I personally spent 45 minutes on the unit in direct patient care. The direct patient care time included face-to-face time with the patient, reviewing the patient's chart, communicating with other professionals, and coordinating care. Greater than 50% of this time was spent in counseling or coordinating care with the patient regarding goals of hospitalization, psycho-education, and discharge planning needs.  Principal Problem: Bipolar I, most recent episode manic, severe with psychotic behavior (HCC) Diagnosis:  Principal Problem:   Bipolar I, most recent episode manic, severe with psychotic behavior (HCC)  Subjective Data: The patient is a 40 year old African-American male with a self-reported past psychiatric history of schizophrenia, who was brought in under IVC by [facility] to the [facility] behavioral health center after he got into an argument with his mother.  According to his admission notes, the petitioner for the IVC was his sister and she was concerned that the patient has previously been diagnosed with schizophrenia and has been noncompliant with medications.  According to the IVC he has been acting aggressively towards family members, showing up unannounced at family members homes, not eating or sleeping, not attending to his personal hygiene, and has been reporting auditory hallucinations.  The patient was evaluated at the behavioral health urgent care where he was noted to be disheveled, hyperverbal, had flight of ideas, endorsed  auditory hallucinations, and was believed to be responding to internal stimuli on assessment. He was continued under IVC and transferred to [facility] for continued stabilization.   During the interview the patient is pressured, disorganized, is hyperverbal, and has flight of ideas making history difficult to obtain. On assessment, the patient states that he had been discussing with his mother his entrepreneurial goals and she did not agree with him.  He states his mother told him that she was concerned about his poor hygiene and lack of self-care.  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  He states that the conversation got loud but at no time was he threatening to himself or others.  He was surprised when police came to take him to the emergency department under IVC.  He denies thoughts of SI or HI and denies history of violence.  He admits that he does have auditory hallucinations that he describes as internal and external,multiple voices having conversations that he can over here.  He states that the voices occasionally tell him where to go but do not give him other commands.  He states that he "senses things" and sees "spiritual entities and shadows."  He frequently makes reference to studying medical topics, studying hereditary trauma, and makes frequent existential statements during the interview.  He admits that he is worried about the energy that is being put out by the [employer] and derails into discussion about frequency waves and how they impact humans and animals.  When asked about ideas of reference he admits he does get messages from the TV but is vague and does not give description.  He admits that  in the past he believed in thought insertion and withdrawal but denies this presently.  He did not answer when asked about thought broadcasting.  When questioned  about his sleep he states he is getting 5 to 8 hours nightly.  He admits that his energy has been increased.  He states his concentration is fair and his appetite has been fluctuating.  He denies issues with hopelessness anhedonia or guilt.  He states he was diagnosed with schizophrenia in [place] approximately 2 years ago and knows that he has been subsequently admitted at [facility] behavioral health once since that time and more recently at [facility].  He thinks he has been on Risperdal but does not recall his other previous psychotropic medication trials.  He states that he attempted to overdose around the age of 27 or 36 on Ritalin.  He does not report any recent psychotropic medication compliance or outpatient mental health compliance.  He states that his family history is significant for his sister having bipolar disorder and his brother having PTSD.  He denies any past medical history.  He states he currently is single and lives alone and does have access to firearms.  He denies any legal charges.  He admits to smoking marijuana 7 blunts per week and has been marijuana user since age 50.  He states he rarely uses alcohol and did abuse ecstasy in his early 42s.  He denies any other illicit or IV drug use.  He occasionally smokes Decelles and milds cigarettes.  According to his records he was admitted to [facility] behavioral health in January 2020 for a cluster of bizarre behaviors and decline in functioning related to medication noncompliance.  He had reported at that time a previous diagnosis of schizophrenia and was stabilized and discharged home on Risperdal Consta.See history and physical for additional details.  Continued Clinical Symptoms:  Alcohol Use Disorder Identification Test Final Score (AUDIT): 3 The "Alcohol Use Disorders Identification Test", Guidelines for Use in Primary Care, Second Edition.  [facility]). Score between 0-7:  no or low risk or alcohol related  problems. Score between 8-15:  moderate risk of alcohol related problems. Score between 16-19:  high risk of alcohol related problems. Score 20 or above:  warrants further diagnostic evaluation for alcohol dependence and treatment.   CLINICAL FACTORS:   Alcohol/Substance Abuse/Dependencies Currently Psychotic Previous Psychiatric Diagnoses and Treatments   Musculoskeletal: Strength & Muscle Tone: within normal limits Gait & Station: normal Patient leans: N/A  Psychiatric Specialty Exam: Physical Exam Vitals reviewed.  HENT:     Head: Normocephalic.  Pulmonary:     Effort: Pulmonary effort is normal.  Neurological:     General: No focal deficit present.     Mental Status: He is alert.    Review of Systems  Constitutional:  Negative for fever.  HENT:  Negative for congestion.   Respiratory:  Negative for cough and shortness of breath.   Cardiovascular:  Negative for chest pain.  Gastrointestinal:  Negative for diarrhea, nausea and vomiting.  Genitourinary:  Negative for difficulty urinating.  Skin:  Negative for rash.  Neurological:  Negative for headaches.   Blood pressure (!) 124/94, pulse 67, temperature 98.4 F (36.9 C), temperature source Oral, resp. rate 16, height 6\' 5"  (1.956 m), weight 81.6 kg, SpO2 100 %.Body mass index is 21.34 kg/m.  General Appearance:  disheveled appearing in sweat pants and sweat shirt and wearing velvet shoes  Eye Contact:  Fair  Speech:  Pressured, hyperverbal  Volume:  Normal  Mood:   elevated  Affect:   expansive  Thought Process:  Disorganized with flight of ideas   Orientation:  Full (Time, Place, and Person)  Thought Content:   Endorses AH and VH as well as ideas of reference; has delusions about 5G towers and makes grandiose statements about studying hereditary trauma and medicine; is not grossly responding to internal/external stimuli on exam; denies paranoia  Suicidal Thoughts:  No  Homicidal Thoughts:  No  Memory: able to  recall 3 objects in 5 minutes  Judgement:  Impaired  Insight:  Lacking  Psychomotor Activity:  Increased - fidgety and animated when he speaks  Concentration:  able to name months in reverse serial order only leaving out 1 month, able to rapidly calculate number of quarters in $2.75  Recall:  Difficulty providing clinical details but able to recall 3 objects in 5 minutes  Fund of Knowledge:  Fair  Language:  Good  Akathisia:  Negative  Assets:  Communication Skills Desire for Improvement Housing Resilience Social Support  ADL's:  independent  Cognition:  WNL   COGNITIVE FEATURES THAT CONTRIBUTE TO RISK:  Thought constriction (tunnel vision)    SUICIDE RISK:   Mild to Moderate:  Given past history of suicide attempt, recent reported impulsivity, current psychosis, and recent substance use.   PLAN OF CARE:  Patient is currently under IVC and second opinion will be completed by house officer.  He has given the team permission to speak to his mother to obtain collateral.  His sister is also reportedly the petitioner and we will attempt to get collateral from family to better understand his previous diagnoses and recent events.  At this time the patient is willing to continue on Zyprexa which was started prior to his transfer to the behavioral health hospital.  We will titrate up to Zyprexa 5 mg now and 10 mg tonight.  We discussed the start of Depakote given the fact that he appears manic on presentation and to help with impulsivity.  He agrees to start Depakote 500 mg twice daily and we will plan to check a Depakote level, LFTs and a CBC in 3 to 4 days.  Attempts were made to discusse the risk, benefits, side effects, and alternatives to these psychotropic medications, but he is currently too disorganized to participate in a meaningful discussion.  We will attempt to provide additional psychoeducation as he continues to clear.    Admission labs were reviewed: Respiratory panel negative, UA  within normal limits, WBC 6.8 hemoglobin and hematocrit 14.7/39.9, platelets 273, CMP within normal limits (AST 18 ALT 12), globin A1c 4.9.0, alcohol less than 10, lipid panel within normal limits except for HDL of 37, TSH 0.413, RPR nonreactive, HIV nonreactive, UDS positive for marijuana, GC/chlamydia negative, EKG shows normal sinus rhythm at 63 bpm with QTC 395 ms.  DX: bipolar I MRE manic severe with psychotic features (r/o substance induced mania/psychosis; r/o schizoaffective d/o bipolar type, r/o schizophrenia by hx) Cannabis use d/o  I certify that inpatient services furnished can reasonably be expected to improve the patient's condition.   Harlow Asa, MD, FAPA 11/02/2021, 11:20 AM

## 2021-11-02 NOTE — Hospital Course (Signed)
Collateral- Mom: Spoke with mom

## 2021-11-02 NOTE — BHH Counselor (Signed)
Adult Comprehensive Assessment  Patient ID: Shawn Nicholson, male   DOB: Nov 15, 1980, 40 y.o.   MRN: [id number]  Information Source: Information source: Patient  Current Stressors:  Patient states their primary concerns and needs for treatment are:: Patient reports that he is here to get evaluated to make sure his medications are working.  Patient reports that he was in an argument with his mother and the police came. Patient states their goals for this hospitilization and ongoing recovery are:: Patient would like to work out what works vs. does not work for himself. Educational / Learning stressors: Denies stressors Employment / Job issues: Denies stressors Family Relationships: Patient reports that his mother acts like a Manufacturing engineer but it is only because she loves him and wants what is best for him Financial / Lack of resources (include bankruptcy): Denies stressors Housing / Lack of housing: patient reports that he lives in a double wide which he has been living in since the 90s. Physical health (include injuries & life threatening diseases): no stressors Social relationships: Patient reports that he is working on his Pharmacist, community.  He is learning that some people are not always interesting in the same things he is and they might not want to here about it as much as he likes to talk about those topics Substance abuse: patient reports daily marijuana use- up to a gram a day Bereavement / Loss: no stressors  Living/Environment/Situation:  Living Arrangements: Alone Living conditions (as described by patient or guardian): its okay Who else lives in the home?: no one How long has patient lived in current situation?: 1998 What is atmosphere in current home: Comfortable  Family History:  Marital status: Single Are you sexually active?: Yes What is your sexual orientation?: Heterosexual Has your sexual activity been affected by drugs, alcohol, medication, or emotional stress?: no Does  patient have children?: No  Childhood History:  By whom was/is the patient raised?: Other (Comment), Mother/father and step-parent, Grandparents Additional childhood history information: Mother was in prison for 21 years while he grew up, just got out last year.  He was raised between his grandparents, father, stepmother, and aunt Description of patient's relationship with caregiver when they were a child: Good relationships with all, closeknit family. Patient's description of current relationship with people who raised him/her: overall a good relationship How were you disciplined when you got in trouble as a child/adolescent?: Whooped, put in time out Does patient have siblings?: Yes Number of Siblings: 5 Description of patient's current relationship with siblings: All are distant now that they have their own families. Did patient suffer any verbal/emotional/physical/sexual abuse as a child?: No Did patient suffer from severe childhood neglect?: No Has patient ever been sexually abused/assaulted/raped as an adolescent or adult?: No Witnessed domestic violence?: No Has patient been affected by domestic violence as an adult?: No  Education:  Highest grade of school patient has completed: some college Currently a Consulting civil engineer?: No Learning disability?: Yes What learning problems does patient have?: ADHD  Employment/Work Situation:   Employment Situation: Unemployed Patient's Job has Been Impacted by Current Illness: No What is the Longest Time Patient has Held a Job?: 11-1/2 years Where was the Patient Employed at that Time?: [employer] Has Patient ever Been in the [employer]?: No  Financial Resources:   Financial resources: Income from employment, Support from parents / caregiver Does patient have a representative payee or guardian?: No  Alcohol/Substance Abuse:   What has been your use of drugs/alcohol within the last 12  months?: marijuana use, up to a gram a day If attempted suicide, did  drugs/alcohol play a role in this?: No Alcohol/Substance Abuse Treatment Hx: Denies past history Has alcohol/substance abuse ever caused legal problems?: Yes  Social Support System:   Patient's Community Support System: Fair Development worker, community Support System: Family, friends Type of faith/religion: spiritual How does patient's faith help to cope with current illness?: n/a  Leisure/Recreation:   Do You Have Hobbies?: Yes Leisure and Hobbies: Goes to museums, mountain climbing, hiking, casinos, traveling, makes music, plays music, arts & crafts, sculptures, shopping, eating out, dancing, being in the crowd, cater to women  Strengths/Needs:   What is the patient's perception of their strengths?: determination, will power, eager to learn Patient states they can use these personal strengths during their treatment to contribute to their recovery: yes Patient states these barriers may affect/interfere with their treatment: none Patient states these barriers may affect their return to the community: none Other important information patient would like considered in planning for their treatment: none  Discharge Plan:   Currently receiving community mental health services: No Patient states concerns and preferences for aftercare planning are: patient reports that he can return home but he forgot to pay light bill so his lights are off. Patient states they will know when they are safe and ready for discharge when: when he has been fully evaluated Does patient have access to transportation?: Yes Does patient have financial barriers related to discharge medications?: Yes Patient description of barriers related to discharge medications: no insurance Will patient be returning to same living situation after discharge?: Yes  Summary/Recommendations:   Summary and Recommendations (to be completed by the evaluator): [name] is a 40 year old male who presents to [facility] accompanied by [facility].  Patient  reports that he was in an argument with his mother and the police came to evaluate him. Patient reports that his support system is his mother and sister.  Patient reports that sometimes he has misunderstandings with family members and they can be hard on him but he knows that they are only hard on him because they love him. Patient also endorses daily marijuana use. Patient has a past psychiatric history and has been hospitalized at [facility] and [facility]. Patient has been diagnosed with schizophrenia.  Patient presents hypermanic and disorganized.  Desiree will benefit from crisis stablization, medication evaluation, group therapy and psychoeducation, in addition to case management for discharge planning.  At discharge it is recommended that patient adhere to the established discharge plan and continue in treatment.  Klayton Monie E Anna Livers. 11/02/2021

## 2021-11-02 NOTE — Progress Notes (Signed)
Patient was pleasant, but guarded and reluctant to disclose symptoms of hallucinations. As engaged the therapeutic dialog, patient endorsed positive auditory and visual hallucinations. Patient stated that he sometimes sees shadow figures and other times he sees light figures.  He also endorse people having polite conversation or arguments. Patient stated "its like what happens here is happening in the after life.  People still argue and fuss with one another, or they have good times." Patient was noted looking up in the corner during conversation with Clinical research associate. Patient has been compliant with medications, attending groups, and has had no behavioral dyscontrol.   Assess patient for safety, offer medications as prescribed, engage patient in 1:1 staff talks.   Continue to monitor as planned. Patient able to contract for safety.

## 2021-11-02 NOTE — BHH Suicide Risk Assessment (Signed)
BHH INPATIENT:  Family/Significant Other Suicide Prevention Education  Suicide Prevention Education:  Education Completed; Shawn Nicholson,  [phone])  (name of family member/significant other) has been identified by the patient as the family member/significant other with whom the patient will be residing, and identified as the person(s) who will aid the patient in the event of a mental health crisis (suicidal ideations/suicide attempt).  With written consent from the patient, the family member/significant other has been provided the following suicide prevention education, prior to the and/or following the discharge of the patient.  CSW spoke with patient mother who reports that her son had a psychotic break about 2.5 years ago when his grandfather passed away.  Mother reports that patient used to work in hospice and feels like witnessing all the death triggered him and now he has been battling ongoing mental health issues.  Mother reports that patient refuses to take oral medications and she feels like he needs to be on an injection to keep his mental health under control.  Mom reports that patient has been acting bizzare and has destroyed his current housing.  Mom also reports that he has not been paying his bills and currently has light bills for $3000+.  Patient's car has also been repossessed.  Mother and father plan to go to his home and try to clean it out.  To mom's knowledge patient does not have access to guns/weapons. Mom reports that patient has a sword but that she does not know if he still has it and when they clean out his home they plan to look for it.  Mom reports patient has never been violent but they worry about his actions due to his bizarre behavior. CSW allowed mom to ask questions about treatment and she demonstrated understanding about hospitalization.   The suicide prevention education provided includes the following: Suicide risk factors Suicide prevention and  interventions National Suicide Hotline telephone number [facility] assessment telephone number [facility] Emergency Assistance 911 [facility] and/or Residential Mobile Crisis Unit telephone number  Request made of family/significant other to: Remove weapons (e.g., guns, rifles, knives), all items previously/currently identified as safety concern.   Remove drugs/medications (over-the-counter, prescriptions, illicit drugs), all items previously/currently identified as a safety concern.  The family member/significant other verbalizes understanding of the suicide prevention education information provided.  The family member/significant other agrees to remove the items of safety concern listed above.  Shawn Nicholson 11/02/2021, 3:09 PM

## 2021-11-02 NOTE — Progress Notes (Signed)
D:  Shawn Nicholson was isolative to his room at the beginning of the shift.  He did later sit in the day room to watch TV and eat a snack.  Minimal interaction with peers.  He was pleasant but minimal.  He denied SI/HI or AVH.  He does however appear to be responding to internal stimuli.  He rated his day as 7/10 (10 the best) because "I met people, ate good meals and my blood pressure was good."  He denied any anxiety or depression.  He denied any pain or discomfort and appeared to be in no physical distress.  He declined to take his evening dose of Depakote after much encouragement.  He willingly took his scheduled Zyprexa.  He did question when he will be discharged and he was encouraged to talk with SW and MD tomorrow.   A:  1:1 with RN for support and encouragement.  Medications given as ordered.  Q 15 minute checks maintained for safety.  Encouraged participation in group and unit activities. R:  He remains safe on the unit.  He is currently resting with his eyes closed and appears to be asleep.  We will continue to monitor the progress towards his goals.    11/02/21 2103  Psych Admission Type (Psych Patients Only)  Admission Status Involuntary  Psychosocial Assessment  Patient Complaints None  Eye Contact Fair  Facial Expression Animated  Affect Anxious  Speech Logical/coherent  Interaction Assertive  Motor Activity Slow  Appearance/Hygiene Unremarkable  Behavior Characteristics Cooperative;Appropriate to situation  Mood Apprehensive;Suspicious  Thought Process  Coherency Concrete thinking  Content Preoccupation;Paranoia;Religiosity  Delusions Religious  Perception Hallucinations  Hallucination Auditory;Visual  Judgment Impaired  Confusion None  Danger to Self  Current suicidal ideation? Denies  Danger to Others  Danger to Others None reported or observed

## 2021-11-03 ENCOUNTER — Encounter (HOSPITAL_COMMUNITY): Payer: Self-pay

## 2021-11-03 MED ORDER — DIVALPROEX SODIUM ER 500 MG PO TB24
1000.0000 mg | ORAL_TABLET | Freq: Every day | ORAL | Status: DC
  Administered 2021-11-04 – 2021-11-08 (×5): 1000 mg via ORAL
  Filled 2021-11-03 (×6): qty 2

## 2021-11-03 MED ORDER — OLANZAPINE 7.5 MG PO TABS
15.0000 mg | ORAL_TABLET | Freq: Every day | ORAL | Status: DC
  Administered 2021-11-04: 21:00:00 15 mg via ORAL
  Filled 2021-11-03 (×3): qty 2

## 2021-11-03 MED ORDER — OLANZAPINE 10 MG PO TABS
10.0000 mg | ORAL_TABLET | Freq: Every day | ORAL | Status: AC
  Administered 2021-11-03: 21:00:00 10 mg via ORAL
  Filled 2021-11-03: qty 1

## 2021-11-03 MED ORDER — DIVALPROEX SODIUM 500 MG PO DR TAB
500.0000 mg | DELAYED_RELEASE_TABLET | Freq: Two times a day (BID) | ORAL | Status: AC
  Administered 2021-11-03: 21:00:00 500 mg via ORAL
  Filled 2021-11-03: qty 1

## 2021-11-03 NOTE — BH IP Treatment Plan (Signed)
Interdisciplinary Treatment and Diagnostic Plan Update  11/03/2021 Shawn Nicholson MRN: [id number]  Principal Diagnosis: Bipolar I, most recent episode manic, severe with psychotic behavior ([place])  Secondary Diagnoses: Principal Problem:   Bipolar I, most recent episode manic, severe with psychotic behavior ([place]) Active Problems:   Cannabis abuse   Current Medications:  Current Facility-Administered Medications  Medication Dose Route Frequency Provider Last Rate Last Admin   acetaminophen (TYLENOL) tablet 650 mg  650 mg Oral Q6H PRN Penn, Cicely, NP       alum & mag hydroxide-simeth (MAALOX/MYLANTA) 200-200-20 MG/5ML suspension 30 mL  30 mL Oral Q4H PRN Penn, Cicely, NP       divalproex (DEPAKOTE) DR tablet 500 mg  500 mg Oral Q12H Nelda Marseille, Amy E, MD   500 mg at 11/03/21 8299   hydrOXYzine (ATARAX) tablet 25 mg  25 mg Oral TID PRN Franne Grip, NP       OLANZapine zydis (ZYPREXA) disintegrating tablet 5 mg  5 mg Oral Q8H PRN Nelda Marseille, Amy E, MD       And   LORazepam (ATIVAN) tablet 1 mg  1 mg Oral PRN Harlow Asa, MD       And   ziprasidone (GEODON) injection 20 mg  20 mg Intramuscular PRN Nelda Marseille, Amy E, MD       magnesium hydroxide (MILK OF MAGNESIA) suspension 30 mL  30 mL Oral Daily PRN Penn, Lunette Stands, NP       OLANZapine (ZYPREXA) tablet 10 mg  10 mg Oral QHS Nelda Marseille, Amy E, MD   10 mg at 11/02/21 2103   OLANZapine (ZYPREXA) tablet 5 mg  5 mg Oral Daily Damita Dunnings B, MD   5 mg at 11/03/21 3716   traZODone (DESYREL) tablet 50 mg  50 mg Oral QHS PRN Penn, Lunette Stands, NP       PTA Medications: Medications Prior to Admission  Medication Sig Dispense Refill Last Dose   omega-3 acid ethyl esters (LOVAZA) 1 g capsule Take 1 capsule (1 g total) by mouth 2 (two) times daily. (Patient taking differently: Take 1 g by mouth daily.) 60 capsule 2     Patient Stressors: Financial difficulties   Health problems   Occupational concerns   Substance abuse    Patient Strengths:  Supportive family/friends  Work skills   Treatment Modalities: Medication Management, Group therapy, Case management,  1 to 1 session with clinician, Psychoeducation, Recreational therapy.   Physician Treatment Plan for Primary Diagnosis: Bipolar I, most recent episode manic, severe with psychotic behavior ([place]) Long Term Goal(s): Improvement in symptoms so as ready for discharge   Short Term Goals: Ability to identify changes in lifestyle to reduce recurrence of condition will improve Ability to verbalize feelings will improve Ability to disclose and discuss suicidal ideas Ability to demonstrate self-control will improve Ability to identify and develop effective coping behaviors will improve Ability to maintain clinical measurements within normal limits will improve Compliance with prescribed medications will improve Ability to identify triggers associated with substance abuse/mental health issues will improve  Medication Management: Evaluate patient's response, side effects, and tolerance of medication regimen.  Therapeutic Interventions: 1 to 1 sessions, Unit Group sessions and Medication administration.  Evaluation of Outcomes: Not Met  Physician Treatment Plan for Secondary Diagnosis: Principal Problem:   Bipolar I, most recent episode manic, severe with psychotic behavior ([place]) Active Problems:   Cannabis abuse  Long Term Goal(s): Improvement in symptoms so as ready for discharge   Short Term Goals: Ability to  identify changes in lifestyle to reduce recurrence of condition will improve Ability to verbalize feelings will improve Ability to disclose and discuss suicidal ideas Ability to demonstrate self-control will improve Ability to identify and develop effective coping behaviors will improve Ability to maintain clinical measurements within normal limits will improve Compliance with prescribed medications will improve Ability to identify triggers associated with substance  abuse/mental health issues will improve     Medication Management: Evaluate patient's response, side effects, and tolerance of medication regimen.  Therapeutic Interventions: 1 to 1 sessions, Unit Group sessions and Medication administration.  Evaluation of Outcomes: Not Met   RN Treatment Plan for Primary Diagnosis: Bipolar I, most recent episode manic, severe with psychotic behavior ([place]) Long Term Goal(s): Knowledge of disease and therapeutic regimen to maintain health will improve  Short Term Goals: Ability to participate in decision making will improve, Ability to verbalize feelings will improve, and Ability to identify and develop effective coping behaviors will improve  Medication Management: RN will administer medications as ordered by provider, will assess and evaluate patient's response and provide education to patient for prescribed medication. RN will report any adverse and/or side effects to prescribing provider.  Therapeutic Interventions: 1 on 1 counseling sessions, Psychoeducation, Medication administration, Evaluate responses to treatment, Monitor vital signs and CBGs as ordered, Perform/monitor CIWA, COWS, AIMS and Fall Risk screenings as ordered, Perform wound care treatments as ordered.  Evaluation of Outcomes: Not Met   LCSW Treatment Plan for Primary Diagnosis: Bipolar I, most recent episode manic, severe with psychotic behavior ([place]) Long Term Goal(s): Safe transition to appropriate next level of care at discharge, Engage patient in therapeutic group addressing interpersonal concerns.  Short Term Goals: Engage patient in aftercare planning with referrals and resources, Increase ability to appropriately verbalize feelings, and Increase emotional regulation  Therapeutic Interventions: Assess for all discharge needs, 1 to 1 time with Social worker, Explore available resources and support systems, Assess for adequacy in community support network, Educate family and  significant other(s) on suicide prevention, Complete Psychosocial Assessment, Interpersonal group therapy.  Evaluation of Outcomes: Not Met   Progress in Treatment: Attending groups: Yes. Participating in groups: Yes. Taking medication as prescribed: Yes. Toleration medication: Yes. Family/Significant other contact made: Yes, individual(s) contacted:  mother Patient understands diagnosis: Yes. Discussing patient identified problems/goals with staff: Yes. Medical problems stabilized or resolved: Yes. Denies suicidal/homicidal ideation: Yes. Issues/concerns per patient self-inventory: No. Other: None  New problem(s) identified: No, Describe:  None  New Short Term/Long Term Goal(s): medication stabilization, elimination of SI thoughts, development of comprehensive mental wellness plan.   Patient Goals:  "Find out what a solution would be for my condition."  Discharge Plan or Barriers: Pt will f/u at [facility] for medication management and therapy.   Reason for Continuation of Hospitalization: Delusions  Mania Medication stabilization  Estimated Length of Stay: 3-5 days   Scribe for Treatment Team: Eliott Nine 11/03/2021 11:05 AM

## 2021-11-03 NOTE — Progress Notes (Addendum)
[facility] MD Progress Note  11/03/2021 2:12 PM Shawn Nicholson  MRN:  [id number]  Subjective:  Shawn Nicholson is a 40 yo patient w/ PPH of schizophrenia who presented to [facility] under IVC via [facility] for AH, poor hygiene, and insomnia. Patient was transferred to [facility] and IVC was continued.   Case was discussed in the multidisciplinary team. MAR was reviewed and patient was NOT compliant with medications.  Patient was noted to not take QHS Depakote but is taking the daily Depakote. He did not require any PRN's for agitation. He was compliant with Zyprexa dosing.   Psychiatric Team made the following recommendations yesterday:  - Start Depakote 500 BID for mood stabilization  - Start Zyprexa 5mg  daily and  continue 10mg  QHS for psychosis and mood stabilization - The r/b/se/a to his medications were discussed and he consents to medication trials but will need additional psycho-education about his medications as he clears  On assessment today patient reports that he slept well. Patient reports that he did not take his QHS of Depakote because he was told it was to help him sleep and he did not believe he needed help with this. Providers discussed with patient that patient needs the medication for his mood regulation. Patient endorses that he does not believe he needs to take "all" the medicine, because " I can use natural ways." Patient reports that he has seen his vitals and he does not think the medicine makes a difference because he "has only taken it a few times, and they tell me I'm fine." Patient reports that he does not understand why he needs this medication and endorses that he is in the hospital "for an assessment and to get the results." He makes reference to belief that he is helping with Research and Development of the environment."  Providers continued to emphasize that patient must take medication and that he is mentally ill. Patient endorsed that having all of his medication at night would make it easier  to accept taking meds.  Patient reported that he is not having AVH but refused to say if he believed he has special powers reporting "I'd rather not talk about that." Patient reported that he does believe that everyone can read each other's thoughts and he endorses he has heard thoughts, but not recently. Patient denied SI and HI. He voiced no physical complaints.  Principal Problem: Bipolar I, most recent episode manic, severe with psychotic behavior ([facility]) Diagnosis: Principal Problem:   Bipolar I, most recent episode manic, severe with psychotic behavior ([facility]) Active Problems:   Cannabis abuse  Total Time Spent in Direct Patient Care:  I personally spent 25 minutes on the unit in direct patient care. The direct patient care time included face-to-face time with the patient, reviewing the patient's chart, communicating with other professionals, and coordinating care. Greater than 50% of this time was spent in counseling or coordinating care with the patient regarding goals of hospitalization, psycho-education, and discharge planning needs.   Past Psychiatric History: See H&P  Past Medical History:  Past Medical History:  Diagnosis Date   Allergy    Anxiety    no meds   Boxer's fracture    RLF   Depression     Past Surgical History:  Procedure Laterality Date   CLOSED REDUCTION METACARPAL WITH PERCUTANEOUS PINNING  10/23/2012   Procedure: CLOSED REDUCTION METACARPAL WITH PERCUTANEOUS PINNING;  Surgeon: , MD;  Location: [facility];  Service: Orthopedics;  Laterality: Right;  OPEN REDUCTION INTERNAL FIXATION (ORIF) METACARPAL  10/23/2012   Procedure: OPEN REDUCTION INTERNAL FIXATION (ORIF) METACARPAL;  Surgeon: Tennis Must, MD;  Location: [place];  Service: Orthopedics;;  RIGHT SMALL AND RING FINGER ORIF VERSUS CLOSED REDUCTION PERCUTANEOUS PINNING   Family History:  Family History  Problem Relation Age of Onset   Hypertension Father     Arthritis Mother    Arthritis Other    Hypertension Other    Cancer Neg Hx    Alcohol abuse Neg Hx    Depression Neg Hx    Diabetes Neg Hx    Drug abuse Neg Hx    Early death Neg Hx    Heart disease Neg Hx    Hyperlipidemia Neg Hx    Kidney disease Neg Hx    Stroke Neg Hx    Family Psychiatric  History: See H&P Social History:  Social History   Substance and Sexual Activity  Alcohol Use Yes   Alcohol/week: 3.0 standard drinks   Types: 3 Cans of beer per week   Comment: social     Social History   Substance and Sexual Activity  Drug Use Yes   Types: Marijuana    Social History   Socioeconomic History   Marital status: Single    Spouse name: Not on file   Number of children: Not on file   Years of education: Not on file   Highest education level: Not on file  Occupational History   Not on file  Tobacco Use   Smoking status: Light Smoker    Types: Cigarettes   Smokeless tobacco: Never  Vaping Use   Vaping Use: Never used  Substance and Sexual Activity   Alcohol use: Yes    Alcohol/week: 3.0 standard drinks    Types: 3 Cans of beer per week    Comment: social   Drug use: Yes    Types: Marijuana   Sexual activity: Not Currently  Other Topics Concern   Not on file  Social History Narrative   Not on file   Social Determinants of Health   Financial Resource Strain: Not on file  Food Insecurity: Not on file  Transportation Needs: Not on file  Physical Activity: Not on file  Stress: Not on file  Social Connections: Not on file   Sleep: Reported by patient as good  Appetite:  Good  Current Medications: Current Facility-Administered Medications  Medication Dose Route Frequency Provider Last Rate Last Admin   acetaminophen (TYLENOL) tablet 650 mg  650 mg Oral Q6H PRN Penn, Cicely, NP       alum & mag hydroxide-simeth (MAALOX/MYLANTA) 200-200-20 MG/5ML suspension 30 mL  30 mL Oral Q4H PRN Penn, Lunette Stands, NP       [START ON 11/04/2021] divalproex (DEPAKOTE  ER) 24 hr tablet 1,000 mg  1,000 mg Oral QHS McQuilla, Jai B, MD       divalproex (DEPAKOTE) DR tablet 500 mg  500 mg Oral Q12H McQuilla, Jai B, MD       hydrOXYzine (ATARAX) tablet 25 mg  25 mg Oral TID PRN Penn, Cicely, NP       OLANZapine zydis (ZYPREXA) disintegrating tablet 5 mg  5 mg Oral Q8H PRN Nelda Marseille, Inioluwa Baris E, MD       And   LORazepam (ATIVAN) tablet 1 mg  1 mg Oral PRN Nelda Marseille, Adalind Weitz E, MD       And   ziprasidone (GEODON) injection 20 mg  20 mg Intramuscular PRN Nelda Marseille, Nevaeh Casillas  E, MD       magnesium hydroxide (MILK OF MAGNESIA) suspension 30 mL  30 mL Oral Daily PRN Penn, Cicely, NP       OLANZapine (ZYPREXA) tablet 10 mg  10 mg Oral QHS Damita Dunnings B, MD       [START ON 11/04/2021] OLANZapine (ZYPREXA) tablet 15 mg  15 mg Oral QHS Damita Dunnings B, MD       OLANZapine (ZYPREXA) tablet 5 mg  5 mg Oral Daily Damita Dunnings B, MD   5 mg at 11/03/21 [id number]   traZODone (DESYREL) tablet 50 mg  50 mg Oral QHS PRN Penn, Lunette Stands, NP        Lab Results: No results found for this or any previous visit (from the past 63 hour(s)).  Blood Alcohol level:  Lab Results  Component Value Date   ETH <10 10/30/2021   ETH <10 0000000    Metabolic Disorder Labs: Lab Results  Component Value Date   HGBA1C 4.9 10/30/2021   MPG 93.93 10/30/2021   MPG 96.8 11/13/2018   No results found for: PROLACTIN Lab Results  Component Value Date   CHOL 131 10/30/2021   TRIG 36 10/30/2021   HDL 37 (L) 10/30/2021   CHOLHDL 3.5 10/30/2021   VLDL 7 10/30/2021   LDLCALC 87 10/30/2021   LDLCALC 112 (H) 11/13/2018    Physical Findings:  Musculoskeletal: Strength & Muscle Tone: within normal limits Gait & Station: normal Patient leans: N/A    Psychiatric Specialty Exam:   Presentation  General Appearance: Disheveled appearing, casually dressed   Eye Contact:Fair but frequently looks about the room in suspicious manner   Speech:Clear and Coherent but rapid and rambling - less pressured today and  more interruptible   Speech Volume:Normal   Handedness:Right     Mood and Affect  Mood:Less Elevated but irritable, argumentative, aloof   Affect:less expansive but at times irritable     Thought Process  Thought Processes:Disorganized and circumstantial   Descriptions of Associations:Loose   Orientation:Full (Time, Place and Person)   Thought Content:Denies AVH but will not comment when questioned about ideas of reference, thought broadcasting, or magical thinking; endorses belief that people can read each other's thoughts; 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; appears guarded and suspicious on exam   Hallucinations:Denies AVH    Ideas of Reference:would not comment   Suicidal Thoughts:Suicidal Thoughts: No   Homicidal Thoughts:Homicidal Thoughts: No     Sensorium  Memory: Limited secondary to mania/psychosis   Judgment:Impaired   Insight:None     Executive Functions  Concentration:Poor   Attention Span: Poor   Recall: untested   Fund of Knowledge:Fair   Language:Good     Psychomotor Activity  Psychomotor Activity:Psychomotor Activity: Restlessness and animated when he talks   Assets  Assets:Communication Skills; Resilience   Sleep  Time unrecorded   Physical Exam Constitutional:      Appearance: Normal appearance.  HENT:     Head: Normocephalic and atraumatic.  Pulmonary:     Effort: Pulmonary effort is normal.  Neurological:     Mental Status: He is alert and oriented to person, place, and time.   Review of Systems  Respiratory:  Negative for shortness of breath.   Cardiovascular:  Negative for chest pain.  Gastrointestinal:  Negative for diarrhea, nausea and vomiting.  Psychiatric/Behavioral:  Negative for hallucinations and suicidal ideas. The patient does not have insomnia.   Blood pressure 129/89, pulse 86, temperature 98 F (36.7 C), temperature  source Oral, resp. rate 16, height 6\' 5"  (1.956 m),  weight 81.6 kg, SpO2 100 %. Body mass index is 21.34 kg/m.   Treatment Plan Summary: Daily contact with patient to assess and evaluate symptoms and progress in treatment and Medication management  Christo Flinn is a 40 yo patient with reported PPH of schizophrenia but who appears manic with psychotic features during his assessment on the unit. Patient is very resistant to care and appears to display very poor insight. SW was able to get collateral from mom who endorsed that patient has a hx of poor compliance with treatment due to poor insight. Patient struggled today with thought processing and was circumstantial, disorganized, had loose associations, and was preoccupied with belief in "natural" medicine.  Bipolar I,  manic episode, w/ psychosis ( R/ O schizoaffective disorder, bipolar type; R/O Substance induced Psychosis/mania, R/O schizophrenia by hx) - Continue Depakote 500 BID for mood stabilization will adjust to Depakote ER 1000 mg QHS starting 12/24 - Continue  Zyprexa 5mg  daily and  continue 10mg  QHS for psychosis and mood stabilization, consolidate to 15mg  QHS starting 12/24 - The r/b/se/a to his medications were discussed and he consents to medication trials but will need additional psycho-education about his medications as he clears   Cannabis Use d/o - would benefit from SA treatment after discharge   Agitation Protocol: Zyprexa 5mg  q8h, Ativan 1mg , Geodon 20 PRN  PGY-2 Freida Busman, MD 11/03/2021, 2:12 PM

## 2021-11-03 NOTE — Group Note (Signed)
[facility] LCSW Group Therapy Note  Date/Time: 1pm on 11/03/2021  Type of Therapy and Topic:  Group Therapy:  Communication  Participation Level:  Active  Description of Group:    In this group patients will be encouraged to explore how individuals communicate with one another appropriately and inappropriately. Patients will be guided to discuss their thoughts, feelings, and behaviors related to barriers communicating feelings, needs, and stressors. The group will process together ways to execute positive and appropriate communications, with attention given to how one use behavior, tone, and body language to communicate. Patient will be encouraged to reflect on an incident where they were successfully able to communicate and the factors that they believe helped them to communicate. Each patient will be encouraged to identify specific changes they are motivated to make in order to overcome communication barriers with self, peers, authority, and parents. This group will be process-oriented, with patients participating in exploration of their own experiences as well as giving and receiving support and challenging self as well as other group members.  Therapeutic Goals: Patient will identify how people communicate (body language, facial expression, and electronics) Also discuss tone, voice and how these impact what is communicated and how the message is perceived.  Patient will identify feelings (such as fear or worry), thought process and behaviors related to why people internalize feelings rather than express self openly. Patient will identify two changes they are willing to make to overcome communication barriers. Members will then practice through Role Play how to communicate by utilizing psycho-education material (such as I Feel statements and acknowledging feelings rather than displacing on others)   Summary of Patient Progress: Patient participated appropriately in group.  He was well engaged and had  good insight into the topic. Patient at times was tangential but was easily redirected and able to get back on topic.  Patient shared about different communication styles he had in relationships.  He discussed how is going to work on listening rather than assuming what others perspectives in an argument are.       Therapeutic Modalities:   Cognitive Behavioral Therapy Solution Focused Therapy Motivational Interviewing Family Systems Approach   Philip Eckersley [place], LCSW, LCAS Clincal Social Worker  [facility]

## 2021-11-03 NOTE — [facility] Group Notes (Signed)
[facility] Group Notes:  (Nursing/MHT/Case Management/Adjunct)  Date:  11/03/2021  Time:  8:38 PM  Type of Therapy:   AA Group  Participation Level:  Minimal  Participation Quality:  Appropriate and Attentive  Affect:  Appropriate  Cognitive:  Alert and Appropriate  Insight:  Appropriate, Good, and Improving  Engagement in Group:  Developing/Improving, Distracting, Engaged, and Improving  Modes of Intervention:  Discussion  Summary of Progress/Problems: PT attended group and was in a good mood. PT was a little distracted at times but was over all appropriate.  Lorita Officer 11/03/2021, 8:38 PM

## 2021-11-03 NOTE — Group Note (Signed)
Date:  11/03/2021 Time:  3:28 PM  Group Topic/Focus:  Goals Group:   The focus of this group is to help patients establish daily goals to achieve during treatment and discuss how the patient can incorporate goal setting into their daily lives to aide in recovery.    Participation Level:  Active  Participation Quality:  Appropriate and Attentive  Affect:  Appropriate  Cognitive:  Appropriate  Insight: Appropriate and Good  Engagement in Group:  Engaged  Modes of Intervention:  Discussion  Additional Comments:  Pt attended morning goals group.  Shawn Nicholson Shawn Nicholson 11/03/2021, 3:28 PM

## 2021-11-03 NOTE — Progress Notes (Signed)
°   11/03/21 0815  Psych Admission Type (Psych Patients Only)  Admission Status Involuntary  Psychosocial Assessment  Patient Complaints None  Eye Contact Fair  Facial Expression Animated  Affect Anxious  Speech Logical/coherent  Interaction Assertive  Motor Activity Slow  Appearance/Hygiene Unremarkable  Behavior Characteristics Appropriate to situation;Cooperative  Mood Anxious;Pleasant  Thought Administrator, sports thinking  Content WDL  Delusions WDL  Perception WDL  Hallucination None reported or observed  Judgment Impaired  Confusion None  Danger to Self  Current suicidal ideation? Denies  Danger to Others  Danger to Others None reported or observed   Pt is pleasant and cooperative.  Pt took medications without incident.  Pt denies SI/HI/AVH.

## 2021-11-03 NOTE — Progress Notes (Signed)
Patient has been up for group and requested his medication before going to his room. He was pleasant and cooperative.

## 2021-11-03 NOTE — Group Note (Signed)
Recreation Therapy Group Note   Group Topic:Problem Solving  Group Date: 11/03/2021 Start Time: 0935 End Time: 1025 Facilitators: Caroll Rancher, LRT,CTRS Location: [address] Dayroom   Goal Area(s) Addresses:  Patient will effectively work in a team with other group members. Patient will verbalize importance of using appropriate problem solving techniques.  Patient will identify positive change associated with effective problem solving skills.   Group Description:  Christmas Trivia.  Patients were broken into groups.  The activity was broken down into four categories (carols, foods, movies/books and traditions/cultures).  Each category also had two rounds.  LRT read off the questions and for each correct answer, team would get the points allotted at the right side of the column.  At the end of each round, teams would talley up their points.  At the completion of the game the team with the most overall total points is the winner.     Affect/Mood: N/A   Participation Level: Did not attend    Clinical Observations/Individualized Feedback:     Plan: Continue to engage patient in RT group sessions 2-3x/week.   Caroll Rancher, Antonietta Jewel 11/03/2021 12:37 PM

## 2021-11-04 NOTE — Progress Notes (Addendum)
[facility] MD Progress Note  11/04/2021 8:53 AM Shawn Nicholson  MRN:  [id number]  Subjective:   Notnamed Shawn Nicholson is a 40 year old African-American male who presents under involuntary commitment.  With a past medical history of schizophrenia, insomnia and poor hygiene.  Patient has a charted history with Bipolar 1 disorder, acute psychosis and cannabis use.  Patient was seen and evaluated by nurse practitioner and attending psychiatrist Nelda Marseille.  He is awake, alert and oriented x3.  Patient observed pacing the unit.  Does not appear to be responding to internal stimuli. However, he does report auditory hallucinations. states " the voices are decreased today." Denid that voices are command in nature. He denied suicidal or homicidal ideations during this assessment.   Lakeem continues to report symptoms of worries related to [employer] and effects on everyone's overall health.  States he is starting to feel a lot better with his mood.  As was reported by attending psychiatrist patient presents less pressured and noted improvement with is thought process than on admission. Patient is currently taking Zyprexa 15 mg and Depakote 1000 mg.    He reports taking and tolerating medications well.  Per provider note on 11/03/2021 medications was adjusted to nighttime administration.  Patient was receptive to plan.  Darnelle Maffucci reports a good appetite.  States he is resting well throughout the night.  States he continues to struggle with childhood trauma, "obstacles"  and past stressors.  Support, encouragement and reassurance was provided.   Principal Problem: Bipolar I, most recent episode manic, severe with psychotic behavior ([place]) Diagnosis: Principal Problem:   Bipolar I, most recent episode manic, severe with psychotic behavior ([place]) Active Problems:   Cannabis abuse  Total Time spent with patient: 15 minutes  Past Psychiatric History:   Past Medical History:  Past Medical History:  Diagnosis Date   Allergy     Anxiety    no meds   Boxer's fracture    RLF   Depression     Past Surgical History:  Procedure Laterality Date   CLOSED REDUCTION METACARPAL WITH PERCUTANEOUS PINNING  10/23/2012   Procedure: CLOSED REDUCTION METACARPAL WITH PERCUTANEOUS PINNING;  Surgeon: Tennis Must, MD;  Location: [place];  Service: Orthopedics;  Laterality: Right;   OPEN REDUCTION INTERNAL FIXATION (ORIF) METACARPAL  10/23/2012   Procedure: OPEN REDUCTION INTERNAL FIXATION (ORIF) METACARPAL;  Surgeon: Tennis Must, MD;  Location: [place] [facility];  Service: Orthopedics;;  RIGHT SMALL AND RING FINGER ORIF VERSUS CLOSED REDUCTION PERCUTANEOUS PINNING   Family History:  Family History  Problem Relation Age of Onset   Hypertension Father    Arthritis Mother    Arthritis Other    Hypertension Other    Cancer Neg Hx    Alcohol abuse Neg Hx    Depression Neg Hx    Diabetes Neg Hx    Drug abuse Neg Hx    Early death Neg Hx    Heart disease Neg Hx    Hyperlipidemia Neg Hx    Kidney disease Neg Hx    Stroke Neg Hx    Family Psychiatric  History:  Social History:  Social History   Substance and Sexual Activity  Alcohol Use Yes   Alcohol/week: 3.0 standard drinks   Types: 3 Cans of beer per week   Comment: social     Social History   Substance and Sexual Activity  Drug Use Yes   Types: Marijuana    Social History   Socioeconomic History  Marital status: Single    Spouse name: Not on file   Number of children: Not on file   Years of education: Not on file   Highest education level: Not on file  Occupational History   Not on file  Tobacco Use   Smoking status: Light Smoker    Types: Cigarettes   Smokeless tobacco: Never  Vaping Use   Vaping Use: Never used  Substance and Sexual Activity   Alcohol use: Yes    Alcohol/week: 3.0 standard drinks    Types: 3 Cans of beer per week    Comment: social   Drug use: Yes    Types: Marijuana   Sexual activity: Not  Currently  Other Topics Concern   Not on file  Social History Narrative   Not on file   Social Determinants of Health   Financial Resource Strain: Not on file  Food Insecurity: Not on file  Transportation Needs: Not on file  Physical Activity: Not on file  Stress: Not on file  Social Connections: Not on file   Additional Social History:                         Sleep: Good  Appetite:  Good  Current Medications: Current Facility-Administered Medications  Medication Dose Route Frequency Provider Last Rate Last Admin   acetaminophen (TYLENOL) tablet 650 mg  650 mg Oral Q6H PRN Penn, Cicely, NP       alum & mag hydroxide-simeth (MAALOX/MYLANTA) 200-200-20 MG/5ML suspension 30 mL  30 mL Oral Q4H PRN Penn, Cicely, NP       divalproex (DEPAKOTE ER) 24 hr tablet 1,000 mg  1,000 mg Oral QHS Eliseo Gum B, MD       hydrOXYzine (ATARAX) tablet 25 mg  25 mg Oral TID PRN Penn, Cicely, NP       OLANZapine zydis (ZYPREXA) disintegrating tablet 5 mg  5 mg Oral Q8H PRN Mason Jim, Aniayah Alaniz E, MD       And   LORazepam (ATIVAN) tablet 1 mg  1 mg Oral PRN Comer Locket, MD       And   ziprasidone (GEODON) injection 20 mg  20 mg Intramuscular PRN Mason Jim, Kameron Glazebrook E, MD       magnesium hydroxide (MILK OF MAGNESIA) suspension 30 mL  30 mL Oral Daily PRN Penn, Cicely, NP       OLANZapine (ZYPREXA) tablet 15 mg  15 mg Oral QHS McQuilla, Jai B, MD       traZODone (DESYREL) tablet 50 mg  50 mg Oral QHS PRN Penn, Cicely, NP        Lab Results: No results found for this or any previous visit (from the past 48 hour(s)).  Blood Alcohol level:  Lab Results  Component Value Date   ETH <10 10/30/2021   ETH <10 11/13/2018    Metabolic Disorder Labs: Lab Results  Component Value Date   HGBA1C 4.9 10/30/2021   MPG 93.93 10/30/2021   MPG 96.8 11/13/2018   No results found for: PROLACTIN Lab Results  Component Value Date   CHOL 131 10/30/2021   TRIG 36 10/30/2021   HDL 37 (L) 10/30/2021    CHOLHDL 3.5 10/30/2021   VLDL 7 10/30/2021   LDLCALC 87 10/30/2021   LDLCALC 112 (H) 11/13/2018    Physical Findings: AIMS:  , ,  ,  ,    CIWA:    COWS:     Musculoskeletal: Strength & Muscle Tone:  within normal limits Gait & Station: normal Patient leans: N/A  Psychiatric Specialty Exam:  Presentation  General Appearance: Appropriate for Environment  Eye Contact:Good  Speech:clear and coherent, less hyper-verbal and no longer pressured  Speech Volume: Normal  Handedness:Right   Mood and Affect  Mood:aloof  Affect:Congruent   Thought Process  Thought Processes:More linear today but still tangential and at times circumstantial  Descriptions of Associations:Tangential  Orientation:Full (Time, Place and Person)  Thought Content: Has residual AH, denies VH and will not comment when asked about magical thinking or first rank symptoms; denies ideas of reference; makes delusional statements about cell towers; is not grossly responding to internal/external stimuli on exam  Hallucinations:AH - will not discuss content  Ideas of Reference:Denied  Suicidal Thoughts:Denied  Homicidal Thoughts:Denied  Sensorium  Memory:Immediate Fair; Recent Fair  Judgment:Improving - compliant with meds  Insight:None   Executive Functions  Concentration:Fair  Attention Span:Fair  [place] of Knowledge:Good  Language:Good   Psychomotor Activity  Psychomotor Activity:Normal - less fidgety  Assets  Assets:Communication Skills; Resilience   Sleep  Time unrecorded   Physical Exam Vitals and nursing note reviewed.  HENT:     Head: Normocephalic.  Pulmonary:     Effort: Pulmonary effort is normal.  Neurological:     General: No focal deficit present.     Mental Status: He is alert.   Review of Systems  Respiratory:  Negative for shortness of breath.   Cardiovascular:  Negative for chest pain.  Gastrointestinal:  Negative for diarrhea, nausea and  vomiting.  Psychiatric/Behavioral:  Positive for hallucinations. Negative for depression and suicidal ideas.   Blood pressure 125/80, pulse 62, temperature 98.2 F (36.8 C), temperature source Oral, resp. rate 18, height 6\' 5"  (1.956 m), weight 81.6 kg, SpO2 100 %. Body mass index is 21.34 kg/m.   Treatment Plan Summary: Daily contact with patient to assess and evaluate symptoms and progress in treatment and Medication management  Bipolar I,  manic episode, w/ psychosis ( R/ O schizoaffective disorder, bipolar type; R/O Substance induced Psychosis/mania, R/O schizophrenia by hx) - Starting Depakote ER 1000 mg QHS starting 12/24 - Consolidating Zyprexa to 15mg  QHS starting 12/24 - will need additional psycho-education about his medications as he clears and can engage in meaningful discussion; declines LAI at this time   Cannabis Use d/o - would benefit from [place] treatment after discharge    Derrill [place], NP 11/04/2021, 8:53 AM

## 2021-11-04 NOTE — Group Note (Signed)
[facility] LCSW Group Therapy Note  11/04/2021  10:00-11:00AM  Type of Therapy and Topic:  Group Therapy:  Acknowledging and Resolving Issues about the Holidays  Participation Level:  Active   Description of Group:   Patients in this group were asked to briefly describe their experiences with Christmas/winter holidays in their lives, both in childhood and adulthood.  Different types of support provided by the influencing individuals in their lives were identified.   Patients were then encouraged to determine whether their influencing figures were/are healthy or unhealthy for them.  The manner in which those early experiences have shaped patient's feelings and actions on an ongoing basis was pointed out and acknowledged.  Group members gave support to each other.  CSW led a discussion on how helpful it can be to resolve past issues, and how this can be done whether the influencing figures are now alive or already deceased.  An emphasis was placed on continuing to work with a therapist on these issues  when patients leave the hospital in order to be able to focus on the future instead of the past, to continue becoming healthier and happier.   Therapeutic Goals: 1)  discuss the possibility of influencing figure(s) being positive and/or negative in one's life, normalizing that some people never had positive experiences with "support" persons  2)  describe patient's specific example of their typical holiday experiences, allowing time to vent  3)  identify the patient's current need to decide how they now want to experience the holidays, by choice instead of only following the traditions of the past  4)  elicit commitments to work on resolving ambivalent feelings about the holidays in order to move forward in life and wellness   Summary of Patient Progress:  The patient expressed comprehension of the concepts presented.   He shared that past holidays were mostly good and exciting, both in childhood and as an  adult with family gatherings.  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  He talked about making New Year's resolutions and checking in with himself frequently about his progress on those.  He spoke quite frequently in group and proposed the idea to the group that while in the hospital on Christmas, they constitute a family.   Therapeutic Modalities:   Processing Brief Solution-Focused Therapy  Lynnell Chad

## 2021-11-04 NOTE — Progress Notes (Signed)
The patient rated his day as a 6 out of 10 since he experienced "improvement and growth". He learned to listen today versus yesterday. He then went on a tangent about family and learning from his past history.

## 2021-11-04 NOTE — Progress Notes (Signed)
Patient has been up in the dayroom briefly tonight, He attended group and participated. He was compliant with his scheduled meds. He spent the remainder of the night in his room after taking his medications. He is very pleasant and calm.

## 2021-11-04 NOTE — Progress Notes (Signed)
Pt denies SI/HI/AVH and verbally agrees to approach staff if these become apparent or before harming themselves/others. Rates depression 0/10. Rates anxiety 0/10. Rates pain 0/10.  Pt spent most of his morning in his room but has been out since lunch. Pt was talking to another pt and seemed to be giving advice. Pt is calm and cooperative. Scheduled medications administered to pt, per MD orders. RN provided support and encouragement to pt. Q15 min safety checks implemented and continued. Pt safe on the unit. RN will continue to monitor and intervene as needed.   11/04/21 0930  Psych Admission Type (Psych Patients Only)  Admission Status Involuntary  Psychosocial Assessment  Patient Complaints None  Eye Contact Fair  Facial Expression Animated  Affect Appropriate to circumstance  Speech Logical/coherent  Interaction Assertive  Motor Activity Other (Comment) (WDL)  Appearance/Hygiene Unremarkable  Behavior Characteristics Cooperative;Appropriate to situation;Calm  Mood Pleasant  Aggressive Behavior  Effect No apparent injury  Thought Process  Coherency Concrete thinking  Content WDL  Delusions None reported or observed  Perception WDL  Hallucination None reported or observed  Judgment Impaired  Confusion None  Danger to Self  Current suicidal ideation? Denies  Danger to Others  Danger to Others None reported or observed

## 2021-11-05 MED ORDER — OLANZAPINE 10 MG PO TABS
20.0000 mg | ORAL_TABLET | Freq: Every day | ORAL | Status: DC
  Administered 2021-11-05 – 2021-11-08 (×4): 20 mg via ORAL
  Filled 2021-11-05 (×6): qty 2

## 2021-11-05 MED ORDER — BENZTROPINE MESYLATE 0.5 MG PO TABS: 0.5000 mg | ORAL_TABLET | Freq: Two times a day (BID) | ORAL | Status: DC | PRN

## 2021-11-05 NOTE — Progress Notes (Signed)
Psychoeducational Group Note  Date:  11/05/2021 Time: 2041  Group Topic/Focus:  Wrap-Up Group:   The focus of this group is to help patients review their daily goal of treatment and discuss progress on daily workbooks.  Participation Level: Did Not Attend  Participation Quality:  Not Applicable  Affect:  Not Applicable  Cognitive:  Not Applicable  Insight:  Not Applicable  Engagement in Group: Not Applicable  Additional Comments:  The patient did not attend group this evening.   Hazle Coca S 11/05/2021, 8:43 PM

## 2021-11-05 NOTE — Progress Notes (Signed)
D.  Pt pleasant on approach, no complaints voiced.  Pt was observed up and in dayroom positively interacting with peers.    A.  Support and encouragement offered, medication given as ordered.    R.  Pt remains safe on the unit, will continue to monitor.

## 2021-11-05 NOTE — [facility] Group Notes (Signed)
[facility] Group Notes: (Clinical Social Work)   11/05/2021      Type of Therapy:  Group Therapy   Participation Level:  Did Not Attend - was invited but preferred to keep watching Christmas movies   Ambrose Mantle, LCSW 11/05/2021, 1:30 PM

## 2021-11-05 NOTE — Progress Notes (Signed)
D:  Patient's self inventory sheet, patient has fair sleep, no sleep medication.  Good appetite, normal energy, good concentration.  Rated depression 3, denied hopeless and anxiety.  Denied withdrawals.  Denied SI.  Denied physical problems.  Denied physical pain.  Goal is thought process and wording.  Plans to learn and listen.   "Thank you for your help." Does have discharge plans. A:  Medications administered per MD orders.  Emotional support and encouragement given patient. R:  Denied SI and HI, contracts for safety.  Denied A/V hallucinations.  Safety maintained with 15 minute checks.

## 2021-11-05 NOTE — Plan of Care (Signed)
Nurse discussed anxiety, depression and coping skills with patient.  

## 2021-11-05 NOTE — Progress Notes (Signed)
[facility] MD Progress Note  11/05/2021 12:27 PM Shawn Nicholson  MRN:  [id number]  HPI:   Shawn Nicholson is a 40 year old African-American male with a past medical history of schizophrenia, bipolar 1 disorder, Cannabis use, psychosis & insomnia.  Pt was admitted involuntarily from the [facility] to [facility] for aggressive behaviors towards his family members, not eating, not sleeping, not tending to personal hygiene needs, talking to people who were not there, and medication non compliance as per the IVC documentation.   Today's Evaluation Chart reviewed, and pt's case discussed with the treatment team. During today's encounter, pt stated: "I feel more alive today than I've felt in a long time". Speech today is clear and coherent, mood is euthymic, affect is congruent, thought content is illogical. Attention to personal hygiene and grooming is fair, pt reports that he took a shower last night, and states he plans on having one today.    Pt reports his sleep quality as good, reports a normal energy level, reports a good appetite, and denies having any current side effects to his medications. Pt denies SI/HI/VH, and endorses +AH of "random sounds", states that the last time he heard the sounds was this morning. Pt reports: "I feel connections to animism, and ancestors". Pt states he "can communicate with birds, squirrels, and all sorts of animals". Pt then goes on to ruminate about believing in "spirits and the supernatural", then states he is still on "a spiritual journey", states that spirits linger around when they have unfinished business on earth. Pt currently exhibiting grandiosity, and also has poor insight into his current condition. Will increase dose of Zyprexa to 20 mg nightly to manage delusional thoughts.  HR slightly elevated this morning at 103. Will keep monitoring, other labs (CMP, CBC, TSH, Hemoglobin A1C WNL). HDL slightly low at 37. Patient denies being in any physical pain.  Principal Problem: Bipolar  I, most recent episode manic, severe with psychotic behavior ([place]) Diagnosis: Principal Problem:   Bipolar I, most recent episode manic, severe with psychotic behavior ([place]) Active Problems:   Cannabis abuse  Total Time spent with patient: 15 minutes  Past Psychiatric History:   Past Medical History:  Past Medical History:  Diagnosis Date   Allergy    Anxiety    no meds   Boxer's fracture    RLF   Depression     Past Surgical History:  Procedure Laterality Date   CLOSED REDUCTION METACARPAL WITH PERCUTANEOUS PINNING  10/23/2012   Procedure: CLOSED REDUCTION METACARPAL WITH PERCUTANEOUS PINNING;  Surgeon: Tennis Must, MD;  Location: [place];  Service: Orthopedics;  Laterality: Right;   OPEN REDUCTION INTERNAL FIXATION (ORIF) METACARPAL  10/23/2012   Procedure: OPEN REDUCTION INTERNAL FIXATION (ORIF) METACARPAL;  Surgeon: Tennis Must, MD;  Location: [place];  Service: Orthopedics;;  RIGHT SMALL AND RING FINGER ORIF VERSUS CLOSED REDUCTION PERCUTANEOUS PINNING   Family History:  Family History  Problem Relation Age of Onset   Hypertension Father    Arthritis Mother    Arthritis Other    Hypertension Other    Cancer Neg Hx    Alcohol abuse Neg Hx    Depression Neg Hx    Diabetes Neg Hx    Drug abuse Neg Hx    Early death Neg Hx    Heart disease Neg Hx    Hyperlipidemia Neg Hx    Kidney disease Neg Hx    Stroke Neg Hx    Family Psychiatric  History:  Social History:  Social History   Substance and Sexual Activity  Alcohol Use Yes   Alcohol/week: 3.0 standard drinks   Types: 3 Cans of beer per week   Comment: social     Social History   Substance and Sexual Activity  Drug Use Yes   Types: Marijuana    Social History   Socioeconomic History   Marital status: Single    Spouse name: Not on file   Number of children: Not on file   Years of education: Not on file   Highest education level: Not on file  Occupational  History   Not on file  Tobacco Use   Smoking status: Light Smoker    Types: Cigarettes   Smokeless tobacco: Never  Vaping Use   Vaping Use: Never used  Substance and Sexual Activity   Alcohol use: Yes    Alcohol/week: 3.0 standard drinks    Types: 3 Cans of beer per week    Comment: social   Drug use: Yes    Types: Marijuana   Sexual activity: Not Currently  Other Topics Concern   Not on file  Social History Narrative   Not on file   Social Determinants of Health   Financial Resource Strain: Not on file  Food Insecurity: Not on file  Transportation Needs: Not on file  Physical Activity: Not on file  Stress: Not on file  Social Connections: Not on file   Sleep: Good  Appetite:  Good  Current Medications: Current Facility-Administered Medications  Medication Dose Route Frequency Provider Last Rate Last Admin   acetaminophen (TYLENOL) tablet 650 mg  650 mg Oral Q6H PRN Penn, Cicely, NP       alum & mag hydroxide-simeth (MAALOX/MYLANTA) 200-200-20 MG/5ML suspension 30 mL  30 mL Oral Q4H PRN Penn, Lunette Stands, NP       divalproex (DEPAKOTE ER) 24 hr tablet 1,000 mg  1,000 mg Oral QHS Damita Dunnings B, MD   1,000 mg at 11/04/21 2111   hydrOXYzine (ATARAX) tablet 25 mg  25 mg Oral TID PRN Franne Grip, NP       OLANZapine zydis (ZYPREXA) disintegrating tablet 5 mg  5 mg Oral Q8H PRN Nelda Marseille, Amy E, MD       And   LORazepam (ATIVAN) tablet 1 mg  1 mg Oral PRN Harlow Asa, MD       And   ziprasidone (GEODON) injection 20 mg  20 mg Intramuscular PRN Nelda Marseille, Amy E, MD       magnesium hydroxide (MILK OF MAGNESIA) suspension 30 mL  30 mL Oral Daily PRN Penn, Cicely, NP       OLANZapine (ZYPREXA) tablet 15 mg  15 mg Oral QHS Damita Dunnings B, MD   15 mg at 11/04/21 2112   traZODone (DESYREL) tablet 50 mg  50 mg Oral QHS PRN Penn, Lunette Stands, NP        Lab Results: No results found for this or any previous visit (from the past 17 hour(s)).  Blood Alcohol level:  Lab Results   Component Value Date   ETH <10 10/30/2021   ETH <10 0000000    Metabolic Disorder Labs: Lab Results  Component Value Date   HGBA1C 4.9 10/30/2021   MPG 93.93 10/30/2021   MPG 96.8 11/13/2018   No results found for: PROLACTIN Lab Results  Component Value Date   CHOL 131 10/30/2021   TRIG 36 10/30/2021   HDL 37 (L) 10/30/2021   CHOLHDL 3.5 10/30/2021  VLDL 7 10/30/2021   LDLCALC 87 10/30/2021   LDLCALC 112 (H) 11/13/2018    Physical Findings: AIMS: Facial and Oral Movements Muscles of Facial Expression: None, normal Lips and Perioral Area: None, normal Jaw: None, normal Tongue: None, normal,Extremity Movements Upper (arms, wrists, hands, fingers): None, normal Lower (legs, knees, ankles, toes): None, normal, Trunk Movements Neck, shoulders, hips: None, normal, Overall Severity Severity of abnormal movements (highest score from questions above): None, normal Incapacitation due to abnormal movements: None, normal Patient's awareness of abnormal movements (rate only patient's report): No Awareness, Dental Status Current problems with teeth and/or dentures?: No Does patient usually wear dentures?: No  CIWA:  N/A COWS:  N/A AIMS: 0  Musculoskeletal: Strength & Muscle Tone: within normal limits Gait & Station: normal Patient leans: N/A  Psychiatric Specialty Exam:  Presentation  General Appearance: Appropriate for Environment; Fairly Groomed  Eye Social research officer, government and coherent   Speech Volume: Normal  Handedness:Right  Mood and Affect  Mood: Euthymic  Affect:Appropriate  Thought Process  Thought Processes: Coherent  Descriptions of Associations:Tangential  Orientation:Full (Time, Place and Person)  Thought Content: illogical  Hallucinations: AH of "random sounds"  Ideas of Reference:Denied  Suicidal Thoughts:Denied  Homicidal Thoughts:Denied  Sensorium  Memory:Immediate Good  Judgment:Improving - compliant with  meds  Insight:Poor  Executive Functions  Concentration:Fair  Attention Span:Fair  Recall:Fair  Fund of Knowledge:Fair  Language:Fair  Psychomotor Activity  Psychomotor Activity:Normal - less fidgety  Assets  Assets:Housing  Sleep  Reported a good sleep quality  Physical Exam Vitals and nursing note reviewed.  HENT:     Head: Normocephalic.     Nose: No congestion or rhinorrhea.  Pulmonary:     Effort: Pulmonary effort is normal. No respiratory distress.  Musculoskeletal:        General: No swelling.     Cervical back: No rigidity.  Skin:    Findings: No rash.  Neurological:     General: No focal deficit present.     Mental Status: He is alert.     Coordination: Coordination normal.   Review of Systems  Constitutional: Negative.  Negative for chills, diaphoresis, fever and malaise/fatigue.  HENT: Negative.  Negative for congestion and sore throat.   Eyes: Negative.   Respiratory:  Negative for cough and shortness of breath.   Cardiovascular:  Negative for chest pain and palpitations.  Gastrointestinal:  Negative for diarrhea, nausea and vomiting.  Genitourinary: Negative.  Negative for frequency.  Musculoskeletal: Negative.  Negative for joint pain and myalgias.  Neurological: Negative.  Negative for dizziness, tingling, tremors, speech change and headaches.  Psychiatric/Behavioral:  Positive for hallucinations (+AH of random sounds) and substance abuse (history of marijuana abuse). Negative for depression and suicidal ideas.   Blood pressure 138/83, pulse (!) 103, temperature 98 F (36.7 C), temperature source Oral, resp. rate 18, height 6\' 5"  (1.956 m), weight 81.6 kg, SpO2 100 %. Body mass index is 21.34 kg/m.   Treatment Plan Summary: Daily contact with patient to assess and evaluate symptoms and progress in treatment and Medication management  Observation Level/Precautions:  15 minute checks  Laboratory:  Labs reviewed   Psychotherapy:  Unit Group  sessions  Medications:  See [facility]  Consultations:  To be determined   Discharge Concerns:  Safety, medication compliance, mood stability  Estimated LOS: 5-7 days  Other:  N/A   Physician Treatment Plan for Primary Diagnosis: Bipolar I, most recent episode manic, severe with psychotic behavior ([facility])  PLAN Safety and Monitoring: Voluntary admission  to inpatient psychiatric unit for safety, stabilization and treatment Daily contact with patient to assess and evaluate symptoms and progress in treatment Patient's case to be discussed in multi-disciplinary team meeting Observation Level : q15 minute checks Vital signs: q12 hours Precautions: suicide   Long Term Goal(s): Improvement in symptoms so as ready for discharge  Short Term Goals: Compliance with prescribed medications will improve and Ability to identify triggers associated with substance abuse/mental health issues will improve  Physician Treatment Plan for Secondary Diagnosis:  Principal Problem:   Bipolar I, most recent episode manic, severe with psychotic behavior ([place]) Active Problems:   Cannabis abuse  Medications Bipolar I,  manic episode, w/ psychosis ( R/ O schizoaffective disorder, bipolar type; R/O Substance induced Psychosis/mania, R/O schizophrenia by hx)  - Continue Depakote ER 1000 mg QHS  - Increase Zyprexa to 20 mg QHS (starting 11/05/21)  Other PRNS -Continue Tylenol 650 mg every 6 hours PRN for mild pain -Continue Maalox 30 mg every 4 hrs PRN for indigestion -Continue Milk of Magnesia as needed every 6 hrs for constipation  Cannabis Use d/o - would benefit from SA treatment after discharge  Discharge Planning: Social work and case management to assist with discharge planning and identification of hospital follow-up needs prior to discharge Estimated LOS: 5-7 days Discharge Concerns: Need to establish a safety plan; Medication compliance and effectiveness Discharge Goals: Return home with outpatient  referrals for mental health follow-up including medication management/psychotherapy  I certify that inpatient services furnished can reasonably be expected to improve the patient's condition.    Nicholes Rough, NP 12/25/202212:27 PM

## 2021-11-06 NOTE — Progress Notes (Signed)
°   11/06/21 2122  Psychosocial Assessment  Patient Complaints None  Eye Contact Fair  Facial Expression Flat (brightens with conversation)  Affect Appropriate to circumstance;Flat  Speech Logical/coherent  Interaction Assertive  Motor Activity Other (Comment) (WDL)  Appearance/Hygiene Unremarkable  Behavior Characteristics Cooperative;Calm  Mood Pleasant  Thought Process  Coherency Concrete thinking  Content Delusions  Delusions Persecutory  Perception Hallucinations  Hallucination Auditory  Judgment Impaired  Confusion None  Danger to Self  Current suicidal ideation? Denies  Danger to Others  Danger to Others None reported or observed

## 2021-11-06 NOTE — B[facility] Group Notes (Signed)
Adult Psychoeducational Group Note  Date:  11/06/2021 Time:  8:49 AM  Group Topic/Focus:  Goals Group:   The focus of this group is to help patients establish daily goals to achieve during treatment and discuss how the patient can incorporate goal setting into their daily lives to aide in recovery.  Participation Level:  Did Not Attend    Shawn Nicholson 11/06/2021, 8:49 AM

## 2021-11-06 NOTE — Group Note (Signed)
LCSW Group Therapy Note   Group Date: 11/06/2021 Start Time: 1300 End Time: 1400 Type of Therapy and Topic:  Group Therapy - Who Am I?  Participation Level:  Active   Description of Group The focus of this group was to aid patients in self-exploration and awareness. Patients were guided in exploring various factors of oneself to include interests, readiness to change, management of emotions, and individual perception of self. Patients were provided with complementary worksheets exploring hidden talents, ease of asking other for help, music/media preferences, understanding and responding to feelings/emotions, and hope for the future. At group closing, patients were encouraged to adhere to discharge plan to assist in continued self-exploration and understanding.  Therapeutic Goals Patients learned that self-exploration and awareness is an ongoing process Patients identified their individual skills, preferences, and abilities Patients explored their openness to establish and confide in supports Patients explored their readiness for change and progression of mental health   Summary of Patient Progress:  Patient readily engaged in introductory check-in. Patient openly engaged in activity of self-exploration and identification, actively completing complementary worksheet to assist in discussion. Patient identified various factors ranging from hidden talents, favorite music and movies, trusted individuals, accountability, and individual perceptions of self and hope. Pt identified hidden talent of being artful, having difficulty to ask for help, finding it easy understanding his own feelings, most used coping skill to be music, what would make life perfect, and overall outlook. Pt engaged in processing thoughts and feelings as well as means of reframing thoughts. Pt proved receptive of alternate group members input and feedback from CSW.   Therapeutic Modalities Cognitive Behavioral  Therapy Motivational Interviewing  Kathrynn Humble 11/06/2021  2:25 PM

## 2021-11-06 NOTE — Progress Notes (Signed)
Psychoeducational Group Note  Date:  11/06/2021 Time:  2209  Group Topic/Focus:  Relapse Prevention Planning:   The focus of this group is to define relapse and discuss the need for planning to combat relapse.  Participation Level: Did Not Attend  Participation Quality:  Not Applicable  Affect:  Not Applicable  Cognitive:  Not Applicable  Insight:  Not Applicable  Engagement in Group: Not Applicable  Additional Comments:  The patient did not attend the evening A.A.meeting .   Hazle Coca S 11/06/2021, 10:09 PM

## 2021-11-06 NOTE — Progress Notes (Signed)
Pt denies SI/HI/AVH and verbally agrees to approach staff if these become apparent or before harming themselves/others. Rates depression 0/10. Rates anxiety 0/10. Rates pain 0/10. Pt minimizes that he has auditory or visual hallucinations. When asked if he was having AVH, pt would hesitate, smile, then say no. PT then stated that he hears the TV channels. Pt seen in room staring at the walls and said he did not want to come out. Pt later went to the dayroom. Scheduled medications administered to pt, per MD orders. RN provided support and encouragement to pt. Q15 min safety checks implemented and continued. Pt safe on the unit. RN will continue to monitor and intervene as needed.   11/05/21 2220  Psych Admission Type (Psych Patients Only)  Admission Status Involuntary  Psychosocial Assessment  Patient Complaints Depression  Eye Contact Fair  Facial Expression Flat  Affect Appropriate to circumstance  Speech Logical/coherent  Interaction Assertive  Motor Activity Other (Comment) (WDL)  Appearance/Hygiene Unremarkable  Behavior Characteristics Appropriate to situation  Mood Depressed;Pleasant  Thought Process  Coherency Concrete thinking  Content WDL  Delusions None reported or observed  Perception WDL  Hallucination None reported or observed  Judgment Impaired  Confusion None  Danger to Self  Current suicidal ideation? Denies  Danger to Others  Danger to Others None reported or observed

## 2021-11-06 NOTE — Group Note (Signed)
Recreation Therapy Group Note   Group Topic:Stress Management  Group Date: 11/06/2021 Start Time: 0930 End Time: 9753 Facilitators: Caroll Rancher, [place] Location: [address] Dayroom   Goal Area(s) Addresses:  Patient will identify positive stress management techniques. Patient will identify benefits of using stress management post d/c.   Group Description:  Meditation.  LRT played a meditation that focused showing love and kindness to yourself and others.  Patients were to repeat positive affirmations quietly in their minds to create a sense of peace, love and understanding.   Affect/Mood: N/A   Participation Level: Did not attend    Clinical Observations/Individualized Feedback:     Plan: Continue to engage patient in RT group sessions 2-3x/week.   Caroll Rancher, LRT,CTRS  11/06/2021 12:09 PM

## 2021-11-06 NOTE — Progress Notes (Signed)
[facility] MD Progress Note  11/06/2021 11:32 AM Austan HITESH FOUCHE  MRN:  [id number]  HPI:   Reynold Mantell is a 40 year old African-American male with a past medical history of schizophrenia, bipolar 1 disorder, Cannabis use, psychosis & insomnia.  Pt was admitted involuntarily from the [facility] to [facility] for aggressive behaviors towards his family members, not eating, not sleeping, not tending to personal hygiene needs, talking to people who were not there, and medication non compliance as per the IVC documentation.   Today's Evaluation Chart reviewed, and pt's case discussed with the treatment team. For today's encounter, pt met with attending Psychiatrist and this NP. Pt calm and cooperative, mood is euthymic and affect is appropriate. Attention to personal hygiene is fair, eye contact fair, pt denies SI/HI/VH, reports a good sleep quality last night and a good appetite.Pt reports feeling drowsy when he first got up this morning, but currently does not feel that way. Speech is clear, but disorganized, thought contents sometimes illogical. Pt endorses +AH earlier this morning, and states it might just be signals coming off the tv, or "humans singing, or confrontations", then added that he did not want to discuss this further. Pt with ruminations about being connected to animals, states that when he was home, he would go out and talk to the birds and squirrels, and had names for them, would feed them, and whistle to them (while mimicking whistling).   Pt also talked about inputs and outputs of the [place], and how he had read an article about someone in [place] who had stayed in the [place], and it affected his heart. Pt also talked about thinking that he had a blood clot in the past while growing up, while pointing to the back of his head, and talked about believing in the past that he was implanted with a device. Thoughts are currently disorganized, some of the contents are illogical. When asked about how he  plans to pay the over $3000 in power bills that he has, pt states that he will go to the governor himself and ask for financial assistance. Attending Psychiatrist discussed benefits of a long acting injectable antipsychotic medication with pt who remains resistant about getting it, and states that his mother is pushing for this, and does understand him because she is male.  V/S are WNL this morning, pt denies being in any physical discomfort. Repeat CBC, Hepatic function panel and Valproic acid level scheduled to be drawn tomorrow (12/27). Pt is disorganized, but improving. Will keep Zyprexa at 20 mg nightly. Will also keep Depakote at 1000 mg nightly.   Principal Problem: Bipolar I, most recent episode manic, severe with psychotic behavior ([place]) Diagnosis: Principal Problem:   Bipolar I, most recent episode manic, severe with psychotic behavior ([place]) Active Problems:   Cannabis abuse  Total Time spent with patient: 15 minutes  Past Psychiatric History:   Past Medical History:  Past Medical History:  Diagnosis Date   Allergy    Anxiety    no meds   Boxer's fracture    RLF   Depression     Past Surgical History:  Procedure Laterality Date   CLOSED REDUCTION METACARPAL WITH PERCUTANEOUS PINNING  10/23/2012   Procedure: CLOSED REDUCTION METACARPAL WITH PERCUTANEOUS PINNING;  Surgeon: Tennis Must, MD;  Location: [facility];  Service: Orthopedics;  Laterality: Right;   OPEN REDUCTION INTERNAL FIXATION (ORIF) METACARPAL  10/23/2012   Procedure: OPEN REDUCTION INTERNAL FIXATION (ORIF) METACARPAL;  Surgeon: Lennette Bihari  Beola Cord, MD;  Location: [facility];  Service: Orthopedics;;  RIGHT SMALL AND RING FINGER ORIF VERSUS CLOSED REDUCTION PERCUTANEOUS PINNING   Family History:  Family History  Problem Relation Age of Onset   Hypertension Father    Arthritis Mother    Arthritis Other    Hypertension Other    Cancer Neg Hx    Alcohol abuse Neg Hx    Depression  Neg Hx    Diabetes Neg Hx    Drug abuse Neg Hx    Early death Neg Hx    Heart disease Neg Hx    Hyperlipidemia Neg Hx    Kidney disease Neg Hx    Stroke Neg Hx    Family Psychiatric  History:  Social History:  Social History   Substance and Sexual Activity  Alcohol Use Yes   Alcohol/week: 3.0 standard drinks   Types: 3 Cans of beer per week   Comment: social     Social History   Substance and Sexual Activity  Drug Use Yes   Types: Marijuana    Social History   Socioeconomic History   Marital status: Single    Spouse name: Not on file   Number of children: Not on file   Years of education: Not on file   Highest education level: Not on file  Occupational History   Not on file  Tobacco Use   Smoking status: Light Smoker    Types: Cigarettes   Smokeless tobacco: Never  Vaping Use   Vaping Use: Never used  Substance and Sexual Activity   Alcohol use: Yes    Alcohol/week: 3.0 standard drinks    Types: 3 Cans of beer per week    Comment: social   Drug use: Yes    Types: Marijuana   Sexual activity: Not Currently  Other Topics Concern   Not on file  Social History Narrative   Not on file   Social Determinants of Health   Financial Resource Strain: Not on file  Food Insecurity: Not on file  Transportation Needs: Not on file  Physical Activity: Not on file  Stress: Not on file  Social Connections: Not on file   Sleep: Good  Appetite:  Good  Current Medications: Current Facility-Administered Medications  Medication Dose Route Frequency Provider Last Rate Last Admin   acetaminophen (TYLENOL) tablet 650 mg  650 mg Oral Q6H PRN Penn, Cicely, NP       alum & mag hydroxide-simeth (MAALOX/MYLANTA) 200-200-20 MG/5ML suspension 30 mL  30 mL Oral Q4H PRN Penn, Cicely, NP       benztropine (COGENTIN) tablet 0.5 mg  0.5 mg Oral BID PRN Harlow Asa, MD       divalproex (DEPAKOTE ER) 24 hr tablet 1,000 mg  1,000 mg Oral QHS Damita Dunnings B, MD   1,000 mg at  11/05/21 2111   hydrOXYzine (ATARAX) tablet 25 mg  25 mg Oral TID PRN Franne Grip, NP       OLANZapine zydis (ZYPREXA) disintegrating tablet 5 mg  5 mg Oral Q8H PRN Nelda Marseille, Amy E, MD       And   LORazepam (ATIVAN) tablet 1 mg  1 mg Oral PRN Nelda Marseille, Amy E, MD       And   ziprasidone (GEODON) injection 20 mg  20 mg Intramuscular PRN Nelda Marseille, Amy E, MD       magnesium hydroxide (MILK OF MAGNESIA) suspension 30 mL  30 mL Oral Daily PRN Franne Grip, NP  OLANZapine (ZYPREXA) tablet 20 mg  20 mg Oral QHS Kaislee Chao, NP   20 mg at 11/05/21 2112   traZODone (DESYREL) tablet 50 mg  50 mg Oral QHS PRN Penn, Lunette Stands, NP       Lab Results: No results found for this or any previous visit (from the past 75 hour(s)).  Blood Alcohol level:  Lab Results  Component Value Date   ETH <10 10/30/2021   ETH <10 11/73/5670    Metabolic Disorder Labs: Lab Results  Component Value Date   HGBA1C 4.9 10/30/2021   MPG 93.93 10/30/2021   MPG 96.8 11/13/2018   No results found for: PROLACTIN Lab Results  Component Value Date   CHOL 131 10/30/2021   TRIG 36 10/30/2021   HDL 37 (L) 10/30/2021   CHOLHDL 3.5 10/30/2021   VLDL 7 10/30/2021   LDLCALC 87 10/30/2021   LDLCALC 112 (H) 11/13/2018   Physical Findings: AIMS: Facial and Oral Movements Muscles of Facial Expression: None, normal Lips and Perioral Area: None, normal Jaw: None, normal Tongue: None, normal,Extremity Movements Upper (arms, wrists, hands, fingers): None, normal Lower (legs, knees, ankles, toes): None, normal, Trunk Movements Neck, shoulders, hips: None, normal, Overall Severity Severity of abnormal movements (highest score from questions above): None, normal Incapacitation due to abnormal movements: None, normal Patient's awareness of abnormal movements (rate only patient's report): No Awareness, Dental Status Current problems with teeth and/or dentures?: No Does patient usually wear dentures?: No  CIWA:   N/A COWS:  N/A AIMS: 0  Musculoskeletal: Strength & Muscle Tone: within normal limits Gait & Station: normal Patient leans: N/A  Psychiatric Specialty Exam:  Presentation  General Appearance: Appropriate for Environment; Casual; Fairly Groomed  Eye Contact:Fair  Speech:clear and coherent   Speech Volume: Normal  Handedness:Right  Mood and Affect  Mood: Euthymic  Affect:Appropriate  Thought Process  Thought Processes: Coherent  Descriptions of Associations:Circumstantial  Orientation:Full (Time, Place and Person)  Thought Content: illogical  Hallucinations: None  Ideas of Reference:Denied  Suicidal Thoughts:Denied  Homicidal Thoughts:Denied  Sensorium  Memory:Immediate Good  Judgment:Improving - compliant with meds  Insight:Fair  Executive Functions  Concentration:Fair  Attention Span:Fair  [place]  Psychomotor Activity  Psychomotor Activity:Normal - less fidgety  Assets  Assets:Social Support; Housing  Sleep  Reported a good sleep quality  Physical Exam Vitals and nursing note reviewed.  HENT:     Head: Normocephalic.     Nose: No congestion or rhinorrhea.  Pulmonary:     Effort: Pulmonary effort is normal. No respiratory distress.  Musculoskeletal:        General: No swelling.     Cervical back: No rigidity.  Skin:    Findings: No rash.  Neurological:     General: No focal deficit present.     Mental Status: He is alert.     Coordination: Coordination normal.   Review of Systems  Constitutional: Negative.  Negative for chills, diaphoresis, fever and malaise/fatigue.  HENT: Negative.  Negative for congestion and sore throat.   Eyes: Negative.   Respiratory:  Negative for cough and shortness of breath.   Cardiovascular:  Negative for chest pain and palpitations.  Gastrointestinal:  Negative for diarrhea, nausea and vomiting.  Genitourinary: Negative.  Negative for frequency.   Musculoskeletal: Negative.  Negative for joint pain and myalgias.  Neurological: Negative.  Negative for dizziness, tingling, tremors, speech change and headaches.  Psychiatric/Behavioral:  Positive for hallucinations (+AH of random sounds) and substance abuse (history  of marijuana abuse). Negative for depression and suicidal ideas. The patient is not nervous/anxious and does not have insomnia.   Blood pressure 119/84, pulse 96, temperature 98.2 F (36.8 C), temperature source Oral, resp. rate 18, height 6' 5"  (1.956 m), weight 81.6 kg, SpO2 98 %. Body mass index is 21.34 kg/m.   Treatment Plan Summary: Daily contact with patient to assess and evaluate symptoms and progress in treatment and Medication management  Observation Level/Precautions:  15 minute checks  Laboratory:  Labs reviewed   Psychotherapy:  Unit Group sessions  Medications:  See [facility]  Consultations:  To be determined   Discharge Concerns:  Safety, medication compliance, mood stability  Estimated LOS: 5-7 days  Other:  N/A   Physician Treatment Plan for Primary Diagnosis: Bipolar I, most recent episode manic, severe with psychotic behavior ([place])  PLAN Safety and Monitoring: Voluntary admission to inpatient psychiatric unit for safety, stabilization and treatment Daily contact with patient to assess and evaluate symptoms and progress in treatment Patient's case to be discussed in multi-disciplinary team meeting Observation Level : q15 minute checks Vital signs: q12 hours Precautions: suicide   Long Term Goal(s): Improvement in symptoms so as ready for discharge  Short Term Goals: Compliance with prescribed medications will improve and Ability to identify triggers associated with substance abuse/mental health issues will improve  Physician Treatment Plan for Secondary Diagnosis:  Principal Problem:   Bipolar I, most recent episode manic, severe with psychotic behavior ([place]) Active Problems:   Cannabis  abuse  Medications Bipolar I,  manic episode, w/ psychosis ( R/ O schizoaffective disorder, bipolar type; R/O Substance induced Psychosis/mania, R/O schizophrenia by hx)  - Continue Depakote ER 1000 mg QHS  - Continue Zyprexa to 20 mg QHS (increased on 11/05/21)  Other PRNS -Continue Tylenol 650 mg every 6 hours PRN for mild pain -Continue Maalox 30 mg every 4 hrs PRN for indigestion -Continue Milk of Magnesia as needed every 6 hrs for constipation  Cannabis Use d/o - would benefit from SA treatment after discharge  Discharge Planning: Social work and case management to assist with discharge planning and identification of hospital follow-up needs prior to discharge Estimated LOS: 5-7 days Discharge Concerns: Need to establish a safety plan; Medication compliance and effectiveness Discharge Goals: Return home with outpatient referrals for mental health follow-up including medication management/psychotherapy  I certify that inpatient services furnished can reasonably be expected to improve the patient's condition.    Nicholes Rough, NP 12/26/202211:32 AM     Patient ID: Nevin Bloodgood, male   DOB: 12-28-1980, 40 y.o.   MRN: [id number]

## 2021-11-07 LAB — CBC WITH DIFFERENTIAL/PLATELET
Abs Immature Granulocytes: 0.02 10*3/uL (ref 0.00–0.07)
Basophils Absolute: 0.1 10*3/uL (ref 0.0–0.1)
Basophils Relative: 1 %
Eosinophils Absolute: 0.6 10*3/uL — ABNORMAL HIGH (ref 0.0–0.5)
Eosinophils Relative: 7 %
HCT: 39.2 % (ref 39.0–52.0)
Hemoglobin: 14 g/dL (ref 13.0–17.0)
Immature Granulocytes: 0 %
Lymphocytes Relative: 36 %
Lymphs Abs: 2.9 10*3/uL (ref 0.7–4.0)
MCH: 29.4 pg (ref 26.0–34.0)
MCHC: 35.7 g/dL (ref 30.0–36.0)
MCV: 82.2 fL (ref 80.0–100.0)
Monocytes Absolute: 0.8 10*3/uL (ref 0.1–1.0)
Monocytes Relative: 10 %
Neutro Abs: 3.8 10*3/uL (ref 1.7–7.7)
Neutrophils Relative %: 46 %
Platelets: 243 10*3/uL (ref 150–400)
RBC: 4.77 MIL/uL (ref 4.22–5.81)
RDW: 13.7 % (ref 11.5–15.5)
WBC: 8.2 10*3/uL (ref 4.0–10.5)
nRBC: 0 % (ref 0.0–0.2)

## 2021-11-07 LAB — HEPATIC FUNCTION PANEL
ALT: 14 U/L (ref 0–44)
AST: 19 U/L (ref 15–41)
Albumin: 4 g/dL (ref 3.5–5.0)
Alkaline Phosphatase: 63 U/L (ref 38–126)
Bilirubin, Direct: <0.1 mg/dL (ref 0.0–0.2)
Total Bilirubin: 0.6 mg/dL (ref 0.3–1.2)
Total Protein: 7.5 g/dL (ref 6.5–8.1)

## 2021-11-07 LAB — VALPROIC ACID LEVEL: Valproic Acid Lvl: 80 ug/mL (ref 50.0–100.0)

## 2021-11-07 MED ORDER — ENSURE ENLIVE PO LIQD
237.0000 mL | Freq: Two times a day (BID) | ORAL | Status: DC
  Administered 2021-11-07 – 2021-11-08 (×3): 237 mL via ORAL
  Filled 2021-11-07 (×6): qty 237

## 2021-11-07 NOTE — Progress Notes (Signed)
Pt denies SI/HI/AVH and verbally agrees to approach staff if these become apparent or before harming themselves/others. Rates depression 0/10. Rates anxiety 0/10. Rates pain 0/10.  Pt has been improving. Pt has been out of room more. RN provided support and encouragement to pt. Q15 min safety checks implemented and continued. Pt safe on the unit. RN will continue to monitor and intervene as needed.   11/07/21 1100  Psych Admission Type (Psych Patients Only)  Admission Status Involuntary  Psychosocial Assessment  Patient Complaints None  Eye Contact Fair  Facial Expression Other (Comment) (WDL)  Affect Appropriate to circumstance  Speech Logical/coherent  Interaction Assertive  Motor Activity Other (Comment) (WDL)  Appearance/Hygiene Unremarkable  Behavior Characteristics Cooperative;Appropriate to situation;Calm  Mood Pleasant  Aggressive Behavior  Effect No apparent injury  Thought Process  Coherency Concrete thinking  Content WDL  Delusions None reported or observed  Perception WDL  Hallucination None reported or observed  Judgment WDL  Confusion None  Danger to Self  Current suicidal ideation? Denies  Danger to Others  Danger to Others None reported or observed

## 2021-11-07 NOTE — [facility] Counselor (Signed)
CSW provided the Pt with a packet that contained information about shelter and housing resources, free and reduced price food listings, clothing resources, and suicide prevention information.  °

## 2021-11-07 NOTE — Progress Notes (Signed)
[facility] MD Progress Note  11/07/2021 1:40 PM Shawn Nicholson  MRN:  [id number]  HPI:   Shawn Nicholson is a 40 year old African-American male with a past medical history of schizophrenia, bipolar 1 disorder, Cannabis use, psychosis & insomnia.  Pt was admitted involuntarily from the [facility] to [facility] for aggressive behaviors towards his family members, not eating, not sleeping, not tending to personal hygiene needs, talking to people who were not there, and medication non compliance as per the IVC documentation.   Today's Evaluation Chart reviewed, and pt's case discussed with the treatment team. Pt calm and cooperative, mood is euthymic and affect is appropriate. Attention to personal hygiene is fair, eye contact fair, pt denies SI/HI/AVH, reports a good sleep quality last night and a good appetite. Pt reports feeling drowsy when he first got up this morning, but states that he has a normal energy level currently. Speech is clear, and coherent. Pt denies having any current side effects to his medications, and states that he is tolerating them well. There are no abnormal movements of his extremities. Pt's mental status is improving on current medications; there is less rambling today, no flight of ideas, and thoughts are more organized today. Pt talks about having a connection to animals, but does not state that they can talk back to him.   V/S are WNL this morning, pt denies being in any physical discomfort. Repeat CBC, Hepatic function panel and Valproic acid level scheduled to be drawn tonight (12/27). Will keep Zyprexa at 20 mg nightly. Will also keep Depakote at 1000 mg nightly. Discharge planning is ongoing with social work.  Principal Problem: Bipolar I, most recent episode manic, severe with psychotic behavior (HCC) Diagnosis: Principal Problem:   Bipolar I, most recent episode manic, severe with psychotic behavior (HCC) Active Problems:   Cannabis abuse  Total Time spent with patient: 15  minutes  Past Psychiatric History:   Past Medical History:  Past Medical History:  Diagnosis Date   Allergy    Anxiety    no meds   Boxer's fracture    RLF   Depression     Past Surgical History:  Procedure Laterality Date   CLOSED REDUCTION METACARPAL WITH PERCUTANEOUS PINNING  10/23/2012   Procedure: CLOSED REDUCTION METACARPAL WITH PERCUTANEOUS PINNING;  Surgeon: Tami Ribas, MD;  Location: [facility];  Service: Orthopedics;  Laterality: Right;   OPEN REDUCTION INTERNAL FIXATION (ORIF) METACARPAL  10/23/2012   Procedure: OPEN REDUCTION INTERNAL FIXATION (ORIF) METACARPAL;  Surgeon: Tami Ribas, MD;  Location: [facility];  Service: Orthopedics;;  RIGHT SMALL AND RING FINGER ORIF VERSUS CLOSED REDUCTION PERCUTANEOUS PINNING   Family History:  Family History  Problem Relation Age of Onset   Hypertension Father    Arthritis Mother    Arthritis Other    Hypertension Other    Cancer Neg Hx    Alcohol abuse Neg Hx    Depression Neg Hx    Diabetes Neg Hx    Drug abuse Neg Hx    Early death Neg Hx    Heart disease Neg Hx    Hyperlipidemia Neg Hx    Kidney disease Neg Hx    Stroke Neg Hx    Family Psychiatric  History:  Social History:  Social History   Substance and Sexual Activity  Alcohol Use Yes   Alcohol/week: 3.0 standard drinks   Types: 3 Cans of beer per week   Comment: social     Social History  Substance and Sexual Activity  Drug Use Yes   Types: Marijuana    Social History   Socioeconomic History   Marital status: Single    Spouse name: Not on file   Number of children: Not on file   Years of education: Not on file   Highest education level: Not on file  Occupational History   Not on file  Tobacco Use   Smoking status: Light Smoker    Types: Cigarettes   Smokeless tobacco: Never  Vaping Use   Vaping Use: Never used  Substance and Sexual Activity   Alcohol use: Yes    Alcohol/week: 3.0 standard drinks     Types: 3 Cans of beer per week    Comment: social   Drug use: Yes    Types: Marijuana   Sexual activity: Not Currently  Other Topics Concern   Not on file  Social History Narrative   Not on file   Social Determinants of Health   Financial Resource Strain: Not on file  Food Insecurity: Not on file  Transportation Needs: Not on file  Physical Activity: Not on file  Stress: Not on file  Social Connections: Not on file   Sleep: Good  Appetite:  Good  Current Medications: Current Facility-Administered Medications  Medication Dose Route Frequency Provider Last Rate Last Admin   acetaminophen (TYLENOL) tablet 650 mg  650 mg Oral Q6H PRN Penn, Cicely, NP       alum & mag hydroxide-simeth (MAALOX/MYLANTA) 200-200-20 MG/5ML suspension 30 mL  30 mL Oral Q4H PRN Penn, Cicely, NP       benztropine (COGENTIN) tablet 0.5 mg  0.5 mg Oral BID PRN Harlow Asa, MD       divalproex (DEPAKOTE ER) 24 hr tablet 1,000 mg  1,000 mg Oral QHS Damita Dunnings B, MD   1,000 mg at 11/06/21 2150   hydrOXYzine (ATARAX) tablet 25 mg  25 mg Oral TID PRN Franne Grip, NP       OLANZapine zydis (ZYPREXA) disintegrating tablet 5 mg  5 mg Oral Q8H PRN Nelda Marseille, Amy E, MD       And   LORazepam (ATIVAN) tablet 1 mg  1 mg Oral PRN Harlow Asa, MD       And   ziprasidone (GEODON) injection 20 mg  20 mg Intramuscular PRN Nelda Marseille, Amy E, MD       magnesium hydroxide (MILK OF MAGNESIA) suspension 30 mL  30 mL Oral Daily PRN Penn, Cicely, NP       OLANZapine (ZYPREXA) tablet 20 mg  20 mg Oral QHS Dedee Liss, NP   20 mg at 11/06/21 2150   traZODone (DESYREL) tablet 50 mg  50 mg Oral QHS PRN Penn, Lunette Stands, NP       Lab Results: No results found for this or any previous visit (from the past 2 hour(s)).  Blood Alcohol level:  Lab Results  Component Value Date   ETH <10 10/30/2021   ETH <10 0000000    Metabolic Disorder Labs: Lab Results  Component Value Date   HGBA1C 4.9 10/30/2021   MPG 93.93  10/30/2021   MPG 96.8 11/13/2018   No results found for: PROLACTIN Lab Results  Component Value Date   CHOL 131 10/30/2021   TRIG 36 10/30/2021   HDL 37 (L) 10/30/2021   CHOLHDL 3.5 10/30/2021   VLDL 7 10/30/2021   LDLCALC 87 10/30/2021   LDLCALC 112 (H) 11/13/2018   Physical Findings: AIMS: Facial and Oral Movements  Muscles of Facial Expression: None, normal Lips and Perioral Area: None, normal Jaw: None, normal Tongue: None, normal,Extremity Movements Upper (arms, wrists, hands, fingers): None, normal Lower (legs, knees, ankles, toes): None, normal, Trunk Movements Neck, shoulders, hips: None, normal, Overall Severity Severity of abnormal movements (highest score from questions above): None, normal Incapacitation due to abnormal movements: None, normal Patient's awareness of abnormal movements (rate only patient's report): No Awareness, Dental Status Current problems with teeth and/or dentures?: No Does patient usually wear dentures?: No  CIWA:  N/A COWS:  N/A AIMS: 0  Musculoskeletal: Strength & Muscle Tone: within normal limits Gait & Station: normal Patient leans: N/A  Psychiatric Specialty Exam:  Presentation  General Appearance: Appropriate for Environment; Fairly Groomed  Eye Contact:Fair  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   Speech Volume: Normal  Handedness:Right  Mood and Affect  Mood: Euthymic  Affect:Appropriate  Thought Process  Thought Processes: Coherent  Descriptions of Associations:Intact  Orientation:Full (Time, Place and Person)  Thought Content: illogical  Hallucinations: None  Ideas of Reference:Denied  Suicidal Thoughts:Denied  Homicidal Thoughts:Denied  Sensorium  Memory:Immediate Good  Judgment:Improving - compliant with meds  Insight:Fair  Executive Functions  Concentration:Fair  Attention Span:Fair  [place]  Psychomotor Activity  Psychomotor Activity:Normal - less  fidgety  Assets  Assets:Communication Skills  Sleep  Reported a good sleep quality  Physical Exam Vitals and nursing note reviewed.  HENT:     Head: Normocephalic.     Nose: No congestion or rhinorrhea.  Pulmonary:     Effort: Pulmonary effort is normal. No respiratory distress.  Musculoskeletal:        General: No swelling.     Cervical back: No rigidity.  Skin:    Findings: No rash.  Neurological:     General: No focal deficit present.     Mental Status: He is alert.     Coordination: Coordination normal.   Review of Systems  Constitutional: Negative.  Negative for chills, diaphoresis, fever and malaise/fatigue.  HENT: Negative.  Negative for congestion and sore throat.   Eyes: Negative.   Respiratory:  Negative for cough and shortness of breath.   Cardiovascular:  Negative for chest pain and palpitations.  Gastrointestinal:  Negative for diarrhea, nausea and vomiting.  Genitourinary: Negative.  Negative for frequency.  Musculoskeletal: Negative.  Negative for joint pain and myalgias.  Neurological: Negative.  Negative for dizziness, tingling, tremors, speech change and headaches.  Psychiatric/Behavioral:  Positive for substance abuse (history of marijuana abuse). Negative for depression, hallucinations (+AH of random sounds) and suicidal ideas. The patient is not nervous/anxious and does not have insomnia.   Blood pressure 137/77, pulse (!) 111, temperature 98.6 F (37 C), temperature source Oral, resp. rate 18, height 6\' 5"  (1.956 m), weight 81.6 kg, SpO2 100 %. Body mass index is 21.34 kg/m.   Treatment Plan Summary: Daily contact with patient to assess and evaluate symptoms and progress in treatment and Medication management  Observation Level/Precautions:  15 minute checks  Laboratory:  Labs reviewed   Psychotherapy:  Unit Group sessions  Medications:  See [facility]  Consultations:  To be determined   Discharge Concerns:  Safety, medication compliance, mood stability   Estimated LOS: 5-7 days  Other:  N/A   Physician Treatment Plan for Primary Diagnosis: Bipolar I, most recent episode manic, severe with psychotic behavior ([place])  PLAN Safety and Monitoring: Voluntary admission to inpatient psychiatric unit for safety, stabilization and treatment Daily contact with patient to assess and evaluate  symptoms and progress in treatment Patient's case to be discussed in multi-disciplinary team meeting Observation Level : q15 minute checks Vital signs: q12 hours Precautions: suicide   Long Term Goal(s): Improvement in symptoms so as ready for discharge  Short Term Goals: Compliance with prescribed medications will improve and Ability to identify triggers associated with substance abuse/mental health issues will improve  Physician Treatment Plan for Secondary Diagnosis:  Principal Problem:   Bipolar I, most recent episode manic, severe with psychotic behavior ([place]) Active Problems:   Cannabis abuse  Medications Bipolar I, manic episode, w/ psychosis ( R/ O schizoaffective disorder, bipolar type; R/O Substance induced Psychosis/mania, R/O schizophrenia by hx)  - Continue Depakote ER 1000 mg QHS  - Continue Zyprexa to 20 mg QHS (increased on 11/05/21)  Other PRNS -Continue Tylenol 650 mg every 6 hours PRN for mild pain -Continue Maalox 30 mg every 4 hrs PRN for indigestion -Continue Milk of Magnesia as needed every 6 hrs for constipation  Cannabis Use d/o - would benefit from SA treatment after discharge  Discharge Planning: Social work and case management to assist with discharge planning and identification of hospital follow-up needs prior to discharge Estimated LOS: 5-7 days Discharge Concerns: Need to establish a safety plan; Medication compliance and effectiveness Discharge Goals: Return home with outpatient referrals for mental health follow-up including medication management/psychotherapy  I certify that inpatient services furnished can  reasonably be expected to improve the patient's condition.    Nicholes Rough, NP 12/27/20221:40 PM

## 2021-11-07 NOTE — BHH Group Notes (Signed)
Pt did not attend morning goals group. 

## 2021-11-07 NOTE — [facility] Group Notes (Signed)
[facility] Group Notes:  (Nursing/MHT/Case Management/Adjunct)  Date:  11/07/2021  Time:  9:10 PM  Type of Therapy:   Wrap Up Group  Participation Level:  Active  Participation Quality:  Attentive  Affect:  Appropriate  Cognitive:  Alert and Appropriate  Insight:  Appropriate, Good, and Improving  Engagement in Group:  Developing/Improving  Modes of Intervention:  Discussion  Summary of Progress/Problems: PT attended group in a neutral mood. PT said that he had a good day and that wanted to work on his new year goals.   Lorita Officer 11/07/2021, 9:10 PM

## 2021-11-08 ENCOUNTER — Encounter (HOSPITAL_COMMUNITY): Payer: Self-pay

## 2021-11-08 DIAGNOSIS — F312 Bipolar disorder, current episode manic severe with psychotic features: Principal | ICD-10-CM

## 2021-11-08 NOTE — BH IP Treatment Plan (Signed)
Interdisciplinary Treatment and Diagnostic Plan Update  11/08/2021 Time of Session: 10:15am  REI MEDLEN MRN: [id number]  Principal Diagnosis: Bipolar I, most recent episode manic, severe with psychotic behavior (HCC)  Secondary Diagnoses: Principal Problem:   Bipolar I, most recent episode manic, severe with psychotic behavior (HCC) Active Problems:   Cannabis abuse   Current Medications:  Current Facility-Administered Medications  Medication Dose Route Frequency Provider Last Rate Last Admin   acetaminophen (TYLENOL) tablet 650 mg  650 mg Oral Q6H PRN Penn, Cicely, NP       alum & mag hydroxide-simeth (MAALOX/MYLANTA) 200-200-20 MG/5ML suspension 30 mL  30 mL Oral Q4H PRN Penn, Cicely, NP       benztropine (COGENTIN) tablet 0.5 mg  0.5 mg Oral BID PRN Comer Locket, MD       divalproex (DEPAKOTE ER) 24 hr tablet 1,000 mg  1,000 mg Oral QHS Eliseo Gum B, MD   1,000 mg at 11/07/21 2109   feeding supplement (ENSURE ENLIVE / ENSURE PLUS) liquid 237 mL  237 mL Oral BID BM Lamar Sprinkles, MD   237 mL at 11/08/21 6384   hydrOXYzine (ATARAX) tablet 25 mg  25 mg Oral TID PRN Mcneil Sober, NP       OLANZapine zydis (ZYPREXA) disintegrating tablet 5 mg  5 mg Oral Q8H PRN Mason Jim, Amy E, MD       And   LORazepam (ATIVAN) tablet 1 mg  1 mg Oral PRN Comer Locket, MD       And   ziprasidone (GEODON) injection 20 mg  20 mg Intramuscular PRN Mason Jim, Amy E, MD       magnesium hydroxide (MILK OF MAGNESIA) suspension 30 mL  30 mL Oral Daily PRN Penn, Cranston Neighbor, NP       OLANZapine (ZYPREXA) tablet 20 mg  20 mg Oral QHS Nkwenti, Doris, NP   20 mg at 11/07/21 2108   traZODone (DESYREL) tablet 50 mg  50 mg Oral QHS PRN Penn, Cranston Neighbor, NP       PTA Medications: Medications Prior to Admission  Medication Sig Dispense Refill Last Dose   omega-3 acid ethyl esters (LOVAZA) 1 g capsule Take 1 capsule (1 g total) by mouth 2 (two) times daily. (Patient taking differently: Take 1 g by mouth  daily.) 60 capsule 2     Patient Stressors: Financial difficulties   Health problems   Occupational concerns   Substance abuse    Patient Strengths: Supportive family/friends  Work skills   Treatment Modalities: Medication Management, Group therapy, Case management,  1 to 1 session with clinician, Psychoeducation, Recreational therapy.   Physician Treatment Plan for Primary Diagnosis: Bipolar I, most recent episode manic, severe with psychotic behavior (HCC) Long Term Goal(s): Improvement in symptoms so as ready for discharge   Short Term Goals: Compliance with prescribed medications will improve Ability to identify triggers associated with substance abuse/mental health issues will improve  Medication Management: Evaluate patient's response, side effects, and tolerance of medication regimen.  Therapeutic Interventions: 1 to 1 sessions, Unit Group sessions and Medication administration.  Evaluation of Outcomes: Progressing  Physician Treatment Plan for Secondary Diagnosis: Principal Problem:   Bipolar I, most recent episode manic, severe with psychotic behavior (HCC) Active Problems:   Cannabis abuse  Long Term Goal(s): Improvement in symptoms so as ready for discharge   Short Term Goals: Compliance with prescribed medications will improve Ability to identify triggers associated with substance abuse/mental health issues will improve     Medication  Management: Evaluate patient's response, side effects, and tolerance of medication regimen.  Therapeutic Interventions: 1 to 1 sessions, Unit Group sessions and Medication administration.  Evaluation of Outcomes: Progressing   RN Treatment Plan for Primary Diagnosis: Bipolar I, most recent episode manic, severe with psychotic behavior (HCC) Long Term Goal(s): Knowledge of disease and therapeutic regimen to maintain health will improve  Short Term Goals: Ability to remain free from injury will improve, Ability to participate in  decision making will improve, Ability to verbalize feelings will improve, Ability to disclose and discuss suicidal ideas, and Ability to identify and develop effective coping behaviors will improve  Medication Management: RN will administer medications as ordered by provider, will assess and evaluate patient's response and provide education to patient for prescribed medication. RN will report any adverse and/or side effects to prescribing provider.  Therapeutic Interventions: 1 on 1 counseling sessions, Psychoeducation, Medication administration, Evaluate responses to treatment, Monitor vital signs and CBGs as ordered, Perform/monitor CIWA, COWS, AIMS and Fall Risk screenings as ordered, Perform wound care treatments as ordered.  Evaluation of Outcomes: Progressing   LCSW Treatment Plan for Primary Diagnosis: Bipolar I, most recent episode manic, severe with psychotic behavior (HCC) Long Term Goal(s): Safe transition to appropriate next level of care at discharge, Engage patient in therapeutic group addressing interpersonal concerns.  Short Term Goals: Engage patient in aftercare planning with referrals and resources, Increase social support, Increase emotional regulation, Facilitate acceptance of mental health diagnosis and concerns, Identify triggers associated with mental health/substance abuse issues, and Increase skills for wellness and recovery  Therapeutic Interventions: Assess for all discharge needs, 1 to 1 time with Social worker, Explore available resources and support systems, Assess for adequacy in community support network, Educate family and significant other(s) on suicide prevention, Complete Psychosocial Assessment, Interpersonal group therapy.  Evaluation of Outcomes: Progressing   Progress in Treatment: Attending groups: Yes. Participating in groups: Yes. Taking medication as prescribed: Yes. Toleration medication: Yes. Family/Significant other contact made: Yes,  individual(s) contacted:  Mother  Patient understands diagnosis: Yes. Discussing patient identified problems/goals with staff: Yes. Medical problems stabilized or resolved: Yes. Denies suicidal/homicidal ideation: Yes. Issues/concerns per patient self-inventory: No.  New problem(s) identified: No, Describe:  None   New Short Term/Long Term Goal(s): medication stabilization, elimination of SI thoughts, development of comprehensive mental wellness plan.   Patient Goals: "To find out what a solution would be for my condition"   Discharge Plan or Barriers: Pt will [facility] at [facility] for medication management and therapy.  Reason for Continuation of Hospitalization: Delusions  Mania Medication stabilization  Estimated Length of Stay: 3 to 5 days    Scribe for Treatment Team: Aram Beecham, Theresia Majors 11/08/2021 9:45 AM

## 2021-11-08 NOTE — BHH Counselor (Signed)
CSW spoke with Shawn Nicholson who states that her son will be living with her after discharge.  She states that he will be living with her until the family can figure out his bills and other living arrangements.  She states that there are no firearms or weapons in her home. Shawn Nicholson states that she has not spoken with her son since he has been at the hospital and she plans to come to the hospital to visit with him tonight.  She states that the CSW can contact her tomorrow (11/09/2021) around 11:00am to discuss if she believes that her son's mental health is improving and if he is at his baseline.  CSW will contact Shawn Nicholson on 11/09/2021 at 11:00am.

## 2021-11-08 NOTE — Progress Notes (Signed)
°   11/07/21 2055  Psych Admission Type (Psych Patients Only)  Admission Status Involuntary  Psychosocial Assessment  Patient Complaints None  Eye Contact Fair  Facial Expression Other (Comment) (WDL)  Affect Appropriate to circumstance  Speech Logical/coherent  Interaction Assertive  Motor Activity Other (Comment) (WDL)  Appearance/Hygiene Unremarkable  Behavior Characteristics Cooperative;Calm;Appropriate to situation  Mood Pleasant  Aggressive Behavior  Effect No apparent injury  Thought Process  Coherency Concrete thinking  Content WDL  Delusions None reported or observed  Perception WDL  Hallucination None reported or observed  Judgment WDL  Confusion None  Danger to Self  Current suicidal ideation? Denies  Danger to Others  Danger to Others None reported or observed

## 2021-11-08 NOTE — Progress Notes (Signed)
Patient rated his day as a 6 or 7 out of 10 since he talked to his peers and appreciates the other patients. His goal for tomorrow is to be "more mindful and plan some goals for 2023.

## 2021-11-08 NOTE — B[facility] Counselor (Signed)
CSW attempted to contact [employer] [phone].  There was no answer.  CSW left a HIPAA compliant voicemail asking for a return call.  CSW will attempt the call again tomorrow 11/09/2021 if a return call is not received.

## 2021-11-08 NOTE — Group Note (Signed)
Recreation Therapy Group Note   Group Topic:Stress Management  Group Date: 11/08/2021 Start Time: 0930 End Time: 0945 Facilitators: Caroll Rancher, LRT,CTRS Location: [address] Dayroom   Goal Area(s) Addresses:  Patient will actively participate in stress management techniques presented during session.  Patient will successfully identify benefit of practicing stress management post d/c.    Group Description: Guided Imagery. LRT provided education, instruction, and demonstration on practice of visualization via guided imagery. Patient was asked to participate in the technique introduced during session. LRT debriefed including topics of mindfulness, stress management and specific scenarios each patient could use these techniques. Patients were given suggestions of ways to access scripts post d/c and encouraged to explore Youtube and other apps available on smartphones, tablets, and computers.   Affect/Mood: N/A   Participation Level: Did not attend    Clinical Observations/Individualized Feedback:     Plan: Continue to engage patient in RT group sessions 2-3x/week.   Caroll Rancher, LRT,CTRS  11/08/2021 11:56 AM

## 2021-11-08 NOTE — Group Note (Signed)
LCSW Group Therapy Note   Group Date: 11/08/2021 Start Time: 1300 End Time: 1400   Type of Group and Topic: Psychoeducational Group: Discharge Planning  Participation Level: Did Not Attend  Description of Group  Discharge planning group reviews patient's anticipated discharge plans and assists patients to anticipate and address any barriers to wellness/recovery in the community. Suicide prevention education is reviewed with patients in group.  Therapeutic Goals  1. Patients will state their anticipated discharge plan and mental health aftercare  2. Patients will identify potential barriers to wellness in the community setting  3. Patients will engage in problem solving, solution focused discussion of ways to anticipate and address barriers to wellness/recovery  Summary of Patient Progress: Did not attend   Aram Beecham, LCSWA 11/08/2021  2:05 PM

## 2021-11-08 NOTE — Progress Notes (Signed)
[facility] MD Progress Note  11/08/2021 6:29 PM Kentaro ANSEN OCASIO  MRN:  [id number]  HPI:   Dionisio Shoff is a 40 year old African-American male with a past medical history of schizophrenia, bipolar 1 disorder, Cannabis use, psychosis & insomnia.  Pt was admitted involuntarily from the [facility] to [facility] for aggressive behaviors towards his family members, not eating, not sleeping, not tending to personal hygiene needs, talking to people who were not there, and medication non compliance as per the IVC documentation.   Today's Evaluation Chart reviewed, and pt's case discussed with the treatment team. Pt calm and cooperative, mood is euthymic and affect is appropriate. Attention to personal hygiene is fair, eye contact fair, pt denies SI/HI/AVH, reports a good sleep quality last night and a good appetite. Pt reports feeling drowsy when he first got up this morning, but states that he has a normal energy level currently.   Speech today is clear, but circumstantial, and pt rambles on and on about birds and how he tries to feed them so as to help "their gut microbes".  Pt denies having any current side effects to his medications, and states that he is tolerating them well. There are no abnormal movements of his extremities. Pt's mental status is improving on current medications, but he has flight of ideas, but is very redirectable. Today, pt reports that he once heard a bird singing a "baby song"  V/S are WNL, pt denies being in any physical discomfort. Repeat CBC, Hepatic function panel and Valproic acid level back and WNL at 80.. Will keep Zyprexa at 20 mg nightly. Will also keep Depakote at 1000 mg nightly. Discharge planning is ongoing with social work.  Principal Problem: Bipolar I, most recent episode manic, severe with psychotic behavior ([place]) Diagnosis: Principal Problem:   Bipolar I, most recent episode manic, severe with psychotic behavior ([place]) Active Problems:   Cannabis abuse  Total Time spent with  patient: 25 minutes  Past Psychiatric History:   Past Medical History:  Past Medical History:  Diagnosis Date   Allergy    Anxiety    no meds   Boxer's fracture    RLF   Depression     Past Surgical History:  Procedure Laterality Date   CLOSED REDUCTION METACARPAL WITH PERCUTANEOUS PINNING  10/23/2012   Procedure: CLOSED REDUCTION METACARPAL WITH PERCUTANEOUS PINNING;  Surgeon: Tennis Must, MD;  Location: [facility];  Service: Orthopedics;  Laterality: Right;   OPEN REDUCTION INTERNAL FIXATION (ORIF) METACARPAL  10/23/2012   Procedure: OPEN REDUCTION INTERNAL FIXATION (ORIF) METACARPAL;  Surgeon: Tennis Must, MD;  Location: [facility];  Service: Orthopedics;;  RIGHT SMALL AND RING FINGER ORIF VERSUS CLOSED REDUCTION PERCUTANEOUS PINNING   Family History:  Family History  Problem Relation Age of Onset   Hypertension Father    Arthritis Mother    Arthritis Other    Hypertension Other    Cancer Neg Hx    Alcohol abuse Neg Hx    Depression Neg Hx    Diabetes Neg Hx    Drug abuse Neg Hx    Early death Neg Hx    Heart disease Neg Hx    Hyperlipidemia Neg Hx    Kidney disease Neg Hx    Stroke Neg Hx    Family Psychiatric  History:  Social History:  Social History   Substance and Sexual Activity  Alcohol Use Yes   Alcohol/week: 3.0 standard drinks   Types: 3 Cans of beer per  week   Comment: social     Social History   Substance and Sexual Activity  Drug Use Yes   Types: Marijuana    Social History   Socioeconomic History   Marital status: Single    Spouse name: Not on file   Number of children: Not on file   Years of education: Not on file   Highest education level: Not on file  Occupational History   Not on file  Tobacco Use   Smoking status: Light Smoker    Types: Cigarettes   Smokeless tobacco: Never  Vaping Use   Vaping Use: Never used  Substance and Sexual Activity   Alcohol use: Yes    Alcohol/week: 3.0 standard  drinks    Types: 3 Cans of beer per week    Comment: social   Drug use: Yes    Types: Marijuana   Sexual activity: Not Currently  Other Topics Concern   Not on file  Social History Narrative   Not on file   Social Determinants of Health   Financial Resource Strain: Not on file  Food Insecurity: Not on file  Transportation Needs: Not on file  Physical Activity: Not on file  Stress: Not on file  Social Connections: Not on file   Sleep: Good  Appetite:  Good  Current Medications: Current Facility-Administered Medications  Medication Dose Route Frequency Provider Last Rate Last Admin   acetaminophen (TYLENOL) tablet 650 mg  650 mg Oral Q6H PRN Penn, Cicely, NP       alum & mag hydroxide-simeth (MAALOX/MYLANTA) 200-200-20 MG/5ML suspension 30 mL  30 mL Oral Q4H PRN Penn, Cicely, NP       benztropine (COGENTIN) tablet 0.5 mg  0.5 mg Oral BID PRN Harlow Asa, MD       divalproex (DEPAKOTE ER) 24 hr tablet 1,000 mg  1,000 mg Oral QHS Damita [place] B, MD   1,000 mg at 11/07/21 2109   feeding supplement (ENSURE ENLIVE / ENSURE PLUS) liquid 237 mL  237 mL Oral BID BM Rosezetta Schlatter, MD   237 mL at 11/08/21 1421   hydrOXYzine (ATARAX) tablet 25 mg  25 mg Oral TID PRN Penn, Lunette Stands, NP       OLANZapine zydis (ZYPREXA) disintegrating tablet 5 mg  5 mg Oral Q8H PRN Nelda Marseille, Amy E, MD       And   LORazepam (ATIVAN) tablet 1 mg  1 mg Oral PRN Harlow Asa, MD       And   ziprasidone (GEODON) injection 20 mg  20 mg Intramuscular PRN Nelda Marseille, Amy E, MD       magnesium hydroxide (MILK OF MAGNESIA) suspension 30 mL  30 mL Oral Daily PRN Penn, Cicely, NP       OLANZapine (ZYPREXA) tablet 20 mg  20 mg Oral QHS Kimisha Eunice, NP   20 mg at 11/07/21 2108   traZODone (DESYREL) tablet 50 mg  50 mg Oral QHS PRN Penn, Lunette Stands, NP       Lab Results:  Results for orders placed or performed during the hospital encounter of 11/01/21 (from the past 48 hour(s))  Valproic acid level     Status:  None   Collection Time: 11/07/21  6:20 PM  Result Value Ref Range   Valproic Acid Lvl 80 50.0 - 100.0 ug/mL    Comment: Performed at [facility], [place] [address] [place], [place] [address]  CBC with Differential/Platelet     Status: Abnormal  Collection Time: 11/07/21  6:20 PM  Result Value Ref Range   WBC 8.2 4.0 - 10.5 K/uL   RBC 4.77 4.22 - 5.81 MIL/uL   Hemoglobin 14.0 13.0 - 17.0 g/dL   HCT 39.2 39.0 - 52.0 %   MCV 82.2 80.0 - 100.0 fL   MCH 29.4 26.0 - 34.0 pg   MCHC 35.7 30.0 - 36.0 g/dL   RDW 13.7 11.5 - 15.5 %   Platelets 243 150 - 400 K/uL   nRBC 0.0 0.0 - 0.2 %   Neutrophils Relative % 46 %   Neutro Abs 3.8 1.7 - 7.7 K/uL   Lymphocytes Relative 36 %   Lymphs Abs 2.9 0.7 - 4.0 K/uL   Monocytes Relative 10 %   Monocytes Absolute 0.8 0.1 - 1.0 K/uL   Eosinophils Relative 7 %   Eosinophils Absolute 0.6 (H) 0.0 - 0.5 K/uL   Basophils Relative 1 %   Basophils Absolute 0.1 0.0 - 0.1 K/uL   Immature Granulocytes 0 %   Abs Immature Granulocytes 0.02 0.00 - 0.07 K/uL    Comment: Performed at [facility], [place] [address], [place], [place] [address]  Hepatic function panel     Status: None   Collection Time: 11/07/21  6:20 PM  Result Value Ref Range   Total Protein 7.5 6.5 - 8.1 g/dL   Albumin 4.0 3.5 - 5.0 g/dL   AST 19 15 - 41 U/L   ALT 14 0 - 44 U/L   Alkaline Phosphatase 63 38 - 126 U/L   Total Bilirubin 0.6 0.3 - 1.2 mg/dL   Bilirubin, Direct <0.1 0.0 - 0.2 mg/dL   Indirect Bilirubin NOT CALCULATED 0.3 - 0.9 mg/dL    Comment: Performed at [facility], [place] [address], [place], [place] [address]    Blood Alcohol level:  Lab Results  Component Value Date   ETH <10 10/30/2021   ETH <10 0000000    Metabolic Disorder Labs: Lab Results  Component Value Date   HGBA1C 4.9 10/30/2021   MPG 93.93 10/30/2021   MPG 96.8 11/13/2018   No results found for: PROLACTIN Lab Results  Component Value Date    CHOL 131 10/30/2021   TRIG 36 10/30/2021   HDL 37 (L) 10/30/2021   CHOLHDL 3.5 10/30/2021   VLDL 7 10/30/2021   LDLCALC 87 10/30/2021   LDLCALC 112 (H) 11/13/2018   Physical Findings: AIMS: Facial and Oral Movements Muscles of Facial Expression: None, normal Lips and Perioral Area: None, normal Jaw: None, normal Tongue: None, normal,Extremity Movements Upper (arms, wrists, hands, fingers): None, normal Lower (legs, knees, ankles, toes): None, normal, Trunk Movements Neck, shoulders, hips: None, normal, Overall Severity Severity of abnormal movements (highest score from questions above): None, normal Incapacitation due to abnormal movements: None, normal Patient's awareness of abnormal movements (rate only patient's report): No Awareness, Dental Status Current problems with teeth and/or dentures?: No Does patient usually wear dentures?: No  CIWA:  N/A COWS:  N/A AIMS: 0  Musculoskeletal: Strength & Muscle Tone: within normal limits Gait & Station: normal Patient leans: N/A  Psychiatric Specialty Exam:  Presentation  General Appearance: Appropriate for Environment; Fairly Groomed  Eye Contact:Fair  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   Speech Volume: Normal  Handedness:Right  Mood and Affect  Mood: Euthymic  Affect:Appropriate  Thought Process  Thought Processes: Coherent  Descriptions of Associations:Circumstantial  Orientation:Full (Time, Place and Person)  Thought Content: illogical  Hallucinations: None  Ideas of Reference:Denied  Suicidal Thoughts:Denied  Homicidal Thoughts:Denied  Sensorium  Memory:Immediate Good  Judgment:Improving - compliant with meds  Insight:Poor  Executive Functions  Concentration:Fair  Attention Span:Fair  [place]  Psychomotor Activity  Psychomotor Activity:Normal - less fidgety  Assets  Assets:Communication Skills  Sleep  Reported a good sleep quality  Physical  Exam Vitals and nursing note reviewed.  HENT:     Head: Normocephalic.     Nose: No congestion or rhinorrhea.  Pulmonary:     Effort: Pulmonary effort is normal. No respiratory distress.  Musculoskeletal:        General: No swelling.     Cervical back: No rigidity.  Skin:    Findings: No rash.  Neurological:     General: No focal deficit present.     Mental Status: He is alert.     Coordination: Coordination normal.   Review of Systems  Constitutional: Negative.  Negative for chills, diaphoresis, fever and malaise/fatigue.  HENT: Negative.  Negative for congestion and sore throat.   Eyes: Negative.   Respiratory:  Negative for cough and shortness of breath.   Cardiovascular:  Negative for chest pain and palpitations.  Gastrointestinal:  Negative for diarrhea, nausea and vomiting.  Genitourinary: Negative.  Negative for frequency.  Musculoskeletal: Negative.  Negative for joint pain and myalgias.  Neurological: Negative.  Negative for dizziness, tingling, tremors, speech change and headaches.  Psychiatric/Behavioral:  Positive for substance abuse (history of marijuana abuse). Negative for depression, hallucinations (+AH of random sounds) and suicidal ideas. The patient is not nervous/anxious and does not have insomnia.   Blood pressure 116/69, pulse 82, temperature 98.1 F (36.7 C), temperature source Oral, resp. rate 18, height 6\' 5"  (1.956 m), weight 81.6 kg, SpO2 100 %. Body mass index is 21.34 kg/m.   Treatment Plan Summary: Daily contact with patient to assess and evaluate symptoms and progress in treatment and Medication management  Observation Level/Precautions:  15 minute checks  Laboratory:  Labs reviewed   Psychotherapy:  Unit Group sessions  Medications:  See [facility]  Consultations:  To be determined   Discharge Concerns:  Safety, medication compliance, mood stability  Estimated LOS: 5-7 days  Other:  N/A   Physician Treatment Plan for Primary Diagnosis: Bipolar I,  most recent episode manic, severe with psychotic behavior ([place])  PLAN Safety and Monitoring: Voluntary admission to inpatient psychiatric unit for safety, stabilization and treatment Daily contact with patient to assess and evaluate symptoms and progress in treatment Patient's case to be discussed in multi-disciplinary team meeting Observation Level : q15 minute checks Vital signs: q12 hours Precautions: suicide   Long Term Goal(s): Improvement in symptoms so as ready for discharge  Short Term Goals: Compliance with prescribed medications will improve and Ability to identify triggers associated with substance abuse/mental health issues will improve  Physician Treatment Plan for Secondary Diagnosis:  Principal Problem:   Bipolar I, most recent episode manic, severe with psychotic behavior ([place]) Active Problems:   Cannabis abuse  Medications Bipolar I, manic episode, w/ psychosis ( R/ O schizoaffective disorder, bipolar type; R/O Substance induced Psychosis/mania, R/O schizophrenia by hx)  - Continue Depakote ER 1000 mg QHS  - Continue Zyprexa to 20 mg QHS (increased on 11/05/21)  Other PRNS -Continue Tylenol 650 mg every 6 hours PRN for mild pain -Continue Maalox 30 mg every 4 hrs PRN for indigestion -Continue Milk of Magnesia as needed every 6 hrs for constipation  Cannabis Use d/o - would benefit from SA treatment after discharge  Discharge Planning: Social work and case management to assist with discharge planning and identification of hospital follow-up needs prior to discharge Estimated LOS: 5-7 days Discharge Concerns: Need to establish a safety plan; Medication compliance and effectiveness Discharge Goals: Return home with outpatient referrals for mental health follow-up including medication management/psychotherapy  I certify that inpatient services furnished can reasonably be expected to improve the patient's condition.    Starleen Blue, NP 12/28/20226:29 PM      Patient ID: ULISSES VONDRAK, male   DOB: 1981-10-09, 40 y.o.   MRN: [id number]

## 2021-11-08 NOTE — BHH Group Notes (Signed)
The focus of this group is to help patients establish daily goals to achieve during treatment and discuss how the patient can incorporate goal setting into their daily lives to aide in recovery.  Pt did not attend 

## 2021-11-08 NOTE — Progress Notes (Signed)
°   11/08/21 2125  Psych Admission Type (Psych Patients Only)  Admission Status Involuntary  Psychosocial Assessment  Patient Complaints None  Eye Contact Fair  Facial Expression Flat;Blank  Affect Appropriate to circumstance  Speech Logical/coherent  Interaction Assertive  Motor Activity Other (Comment) (WDL)  Appearance/Hygiene Unremarkable  Behavior Characteristics Cooperative;Appropriate to situation  Mood Pleasant  Thought Process  Coherency Concrete thinking;Blocking  Content WDL  Delusions None reported or observed  Perception WDL  Hallucination None reported or observed  Judgment WDL  Confusion None  Danger to Self  Current suicidal ideation? Denies  Danger to Others  Danger to Others None reported or observed

## 2021-11-09 DIAGNOSIS — F250 Schizoaffective disorder, bipolar type: Secondary | ICD-10-CM

## 2021-11-09 MED ORDER — HYDROXYZINE HCL 25 MG PO TABS: 25.0000 mg | ORAL_TABLET | Freq: Three times a day (TID) | ORAL | 0 refills | Status: DC | PRN

## 2021-11-09 MED ORDER — TRAZODONE HCL 50 MG PO TABS: 50.0000 mg | ORAL_TABLET | Freq: Every evening | ORAL | 0 refills | Status: DC | PRN

## 2021-11-09 MED ORDER — OLANZAPINE 20 MG PO TABS: 20.0000 mg | ORAL_TABLET | Freq: Every day | ORAL | 0 refills | Status: DC

## 2021-11-09 MED ORDER — DIVALPROEX SODIUM ER 500 MG PO TB24: 1000.0000 mg | ORAL_TABLET | Freq: Every day | ORAL | 0 refills | Status: DC

## 2021-11-09 NOTE — Progress Notes (Signed)
Patient denies SI, HI and AVH upon discharge.  Patient acknowledged understanding of all discharge instructions and receipt of all belongings. Patient was discharged to home/self care in the presence of his family.

## 2021-11-09 NOTE — BHH Counselor (Addendum)
CSW spoke with the Pt's mother again who states that she will be at [facility] at 1:00pm to pick up her son for discharge.  CSW will inform the providers of this change in time.

## 2021-11-09 NOTE — Discharge Summary (Signed)
Physician Discharge Summary Note  Shawn Nicholson:  Shawn Nicholson is an 40 y.o., male MRN:  [id number] DOB:  04-19-1981 Shawn Nicholson phone:  [phone] (home)  Shawn Nicholson address:   [place] [address],  Total Time spent with Shawn Nicholson:  Greater than 30 minutes  Date of Admission:  11/01/2021 Date of Discharge: 11-09-21  Reason for Admission: Worsening auditory hallucination & associated symptoms.  Principal Problem: Bipolar I, most recent episode manic, severe with psychotic behavior [facility])  Discharge Diagnoses: Principal Problem:   Bipolar I, most recent episode manic, severe with psychotic behavior ([place]) Active Problems:   Cannabis abuse  Past Psychiatric History: Hx. Bipolar disorder.  Past Medical History:  Past Medical History:  Diagnosis Date   Allergy    Anxiety    no meds   Boxer's fracture    RLF   Depression     Past Surgical History:  Procedure Laterality Date   CLOSED REDUCTION METACARPAL WITH PERCUTANEOUS PINNING  10/23/2012   Procedure: CLOSED REDUCTION METACARPAL WITH PERCUTANEOUS PINNING;  Surgeon: Tennis Must, MD;  Location: [place];  Service: Orthopedics;  Laterality: Right;   OPEN REDUCTION INTERNAL FIXATION (ORIF) METACARPAL  10/23/2012   Procedure: OPEN REDUCTION INTERNAL FIXATION (ORIF) METACARPAL;  Surgeon: Tennis Must, MD;  Location: [place];  Service: Orthopedics;;  RIGHT SMALL AND RING FINGER ORIF VERSUS CLOSED REDUCTION PERCUTANEOUS PINNING   Family History:  Family History  Problem Relation Age of Onset   Hypertension Father    Arthritis Mother    Arthritis Other    Hypertension Other    Cancer Neg Hx    Alcohol abuse Neg Hx    Depression Neg Hx    Diabetes Neg Hx    Drug abuse Neg Hx    Early death Neg Hx    Heart disease Neg Hx    Hyperlipidemia Neg Hx    Kidney disease Neg Hx    Stroke Neg Hx    Family Psychiatric  History: See H&P.  Social History:  Social History    Substance and Sexual Activity  Alcohol Use Yes   Alcohol/week: 3.0 standard drinks   Types: 3 Cans of beer per week   Comment: social     Social History   Substance and Sexual Activity  Drug Use Yes   Types: Marijuana    Social History   Socioeconomic History   Marital status: Single    Spouse name: Not on file   Number of children: Not on file   Years of education: Not on file   Highest education level: Not on file  Occupational History   Not on file  Tobacco Use   Smoking status: Light Smoker    Types: Cigarettes   Smokeless tobacco: Never  Vaping Use   Vaping Use: Never used  Substance and Sexual Activity   Alcohol use: Yes    Alcohol/week: 3.0 standard drinks    Types: 3 Cans of beer per week    Comment: social   Drug use: Yes    Types: Marijuana   Sexual activity: Not Currently  Other Topics Concern   Not on file  Social History Narrative   Not on file   Social Determinants of Health   Financial Resource Strain: Not on file  Food Insecurity: Not on file  Transportation Needs: Not on file  Physical Activity: Not on file  Stress: Not on file  Social Connections: Not on file   Hospital Course: (Per  admission evaluation notes): Shawn Nicholson is a 40 yo Shawn Nicholson w/ PPH of schizophrenia who presented to [facility] under IVC via [employer] for endorsing AH, poor hygiene, and insomnia. Shawn Nicholson was transferred to [facility] and IVC was continued.   Prior to this discharge, Shawn Nicholson was seen & evaluated for mental health stability. The current laboratory findings were reviewed (stable). The nurses notes & vital signs were reviewed as well. There are no current mental health or medical issues that should prevent this discharge at this time. Shawn Nicholson is being discharged to continue mental health care/medication management as noted below.   After the above admission evaluation, Shawn Nicholson's presenting symptoms were noted. Shawn Nicholson was recommended for mood stabilization treatments. The medication  regimen targeting those presenting symptoms were discussed with Shawn Nicholson & initiated with his consent. Shawn Nicholson was medicated, stabilized & discharged on the medications as listed on his discharge medication lists below. Besides the mood stabilization treatments, Shawn Nicholson was was also enrolled & participated in the group counseling sessions being offered & held on this unit. Shawn Nicholson learned coping skills. Shawn Nicholson presented no other significant pre-existing medical issues that required treatments. Shawn Nicholson tolerated his treatment regimen without any adverse effects or reactions reported. [name]'s symptoms responded well to his treatment regimen warranting this discharge. Shawn Nicholson is also mentally/medically stable & agreeable to this discharge.  During the course of this this hospitalization, there were no instances of behavioral issues that required restraints or immediate intervention. Shawn Nicholson remained safe on the unit. There were no instances of self-harming behavior noted. There were no threats to other patients/staff. Shawn Nicholson remained cooperative to his daily routines & taking his treatment regimen as recommended by his treatment team. Shawn Nicholson participated in the group sessions and interacted with staff and other patients appropriately.   Over the course of this hospitalization, Shawn Nicholson's symptoms responded well to his treatment regimen & his mood has improved. Shawn Nicholson was able to maintain his personal hygiene. On this day of his hospital discharge, Shawn Nicholson denies any thoughts of self-harm, suicidal/homicidal ideations. Shawn Nicholson is not observed to be responding to internal any internal stimuli. Shawn Nicholson has been compliant with his recommended treatment regimen. Shawn Nicholson is currently showing readiness for discharge & is future-oriented thinking. Shawn Nicholson is encouraged to keep all psychiatric appointments & to continue with his medications as prescribed. Shawn Nicholson left [facility] with all personal belongings in no apparent distress. Transportation per his family.      Physical  Findings: AIMS: Facial and Oral Movements Muscles of Facial Expression: None, normal Lips and Perioral Area: None, normal Jaw: None, normal Tongue: None, normal,Extremity Movements Upper (arms, wrists, hands, fingers): None, normal Lower (legs, knees, ankles, toes): None, normal, Trunk Movements Neck, shoulders, hips: None, normal, Overall Severity Severity of abnormal movements (highest score from questions above): None, normal Incapacitation due to abnormal movements: None, normal Shawn Nicholson's awareness of abnormal movements (rate only Shawn Nicholson's report): No Awareness, Dental Status Current problems with teeth and/or dentures?: No Does Shawn Nicholson usually wear dentures?: No  CIWA:    COWS:     Musculoskeletal: Strength & Muscle Tone: within normal limits Gait & Station: normal Shawn Nicholson leans: N/A  Psychiatric Specialty Exam:  Presentation  General Appearance: Appropriate for Environment; Casual; Fairly Groomed  Eye Contact:Good  Speech:Clear and Coherent; Normal Rate  Speech Volume:Normal  Handedness:Right  Mood and Affect  Mood:Euthymic  Affect:Congruent; Appropriate  Thought Process  Thought Processes:Coherent; Linear; Goal Directed  Descriptions of Associations:Intact  Orientation:Full (Time, Place and Person)  Thought Content:Logical  History of Schizophrenia/Schizoaffective disorder:No  Duration of Psychotic Symptoms:N/A  Hallucinations:Hallucinations: None Description of Auditory Hallucinations: NA Description of Visual Hallucinations: NA  Ideas of Reference:None  Suicidal Thoughts:Suicidal Thoughts: No  Homicidal Thoughts:Homicidal Thoughts: No  Sensorium  Memory:Immediate Good; Recent Good; Remote Good  Judgment:Good  Insight:Fair  Executive Functions  Concentration:Good  Attention Span:Good  [place] of Knowledge:Good  Language:Good  Psychomotor Activity  Psychomotor Activity:Psychomotor Activity: Normal  Assets   Assets:Communication Skills; Desire for Improvement; Housing; Physical Health; Resilience; Social Support  Sleep  Sleep:Sleep: Good Number of Hours of Sleep: 6  Physical Exam: Physical Exam Vitals and nursing note reviewed.  HENT:     Nose: Nose normal.     Mouth/Throat:     Pharynx: Oropharynx is clear.  Eyes:     Pupils: Pupils are equal, round, and reactive to light.  Cardiovascular:     Rate and Rhythm: Normal rate.     Pulses: Normal pulses.  Pulmonary:     Effort: Pulmonary effort is normal.  Genitourinary:    Comments: Deferred Musculoskeletal:        General: Normal range of motion.     Cervical back: Normal range of motion.  Skin:    General: Skin is warm and dry.  Neurological:     General: No focal deficit present.     Mental Status: Shawn Nicholson is alert and oriented to person, place, and time. Mental status is at baseline.   Review of Systems  Constitutional:  Negative for chills, diaphoresis and fever.  HENT:  Negative for congestion and sore throat.   Eyes:  Negative for blurred vision.  Respiratory:  Negative for cough, shortness of breath and wheezing.   Cardiovascular:  Negative for chest pain and palpitations.  Gastrointestinal:  Negative for abdominal pain, constipation, diarrhea, heartburn, nausea and vomiting.  Genitourinary:  Negative for dysuria.  Musculoskeletal:  Negative for joint pain and myalgias.  Skin: Negative.   Neurological:  Negative for dizziness, tingling, tremors, sensory change, speech change, focal weakness, seizures, loss of consciousness, weakness and headaches.  Endo/Heme/Allergies:        Allergies: NKDA  Psychiatric/Behavioral:  Positive for hallucinations (Hx psychosis (stable on medication).) and substance abuse (Hx. cannabis use disorder.). Negative for depression, memory loss and suicidal ideas. The Shawn Nicholson has insomnia (Hx of (stable on medication).). The Shawn Nicholson is not nervous/anxious (Stable on medication).   Blood pressure  115/78, pulse (!) 110, temperature 98.7 F (37.1 C), temperature source Oral, resp. rate 18, height 6\' 5"  (1.956 m), weight 81.6 kg, SpO2 98 %. Body mass index is 21.34 kg/m.   Social History   Tobacco Use  Smoking Status Light Smoker   Types: Cigarettes  Smokeless Tobacco Never   Tobacco Cessation:  N/A, Shawn Nicholson does not currently use tobacco products  Blood Alcohol level:  Lab Results  Component Value Date   ETH <10 10/30/2021   ETH <10 0000000   Metabolic Disorder Labs:  Lab Results  Component Value Date   HGBA1C 4.9 10/30/2021   MPG 93.93 10/30/2021   MPG 96.8 11/13/2018   No results found for: PROLACTIN Lab Results  Component Value Date   CHOL 131 10/30/2021   TRIG 36 10/30/2021   HDL 37 (L) 10/30/2021   CHOLHDL 3.5 10/30/2021   VLDL 7 10/30/2021   LDLCALC 87 10/30/2021   LDLCALC 112 (H) 11/13/2018   See Psychiatric Specialty Exam and Suicide Risk Assessment completed by Attending Physician prior to discharge.  Discharge destination:  Home  Is Shawn Nicholson on multiple antipsychotic therapies at discharge:  No  Has Shawn Nicholson had three or more failed trials of antipsychotic monotherapy by history:  No  Recommended Plan for Multiple Antipsychotic Therapies: NA  Allergies as of 11/09/2021       Reactions   Other Other (See Comments)   Reaction to pecans showed up on allergy test   Penicillins Rash        Medication List     STOP taking these medications    omega-3 acid ethyl esters 1 g capsule Commonly known as: LOVAZA       TAKE these medications      Indication  divalproex 500 MG 24 hr tablet Commonly known as: DEPAKOTE ER Take 2 tablets (1,000 mg total) by mouth at bedtime. For mood stabilization  Indication: Mood stabilization   hydrOXYzine 25 MG tablet Commonly known as: ATARAX Take 1 tablet (25 mg total) by mouth 3 (three) times daily as needed for anxiety.  Indication: Feeling Anxious   OLANZapine 20 MG tablet Commonly known as:  ZYPREXA Take 1 tablet (20 mg total) by mouth at bedtime. For mood control  Indication: Manic Phase of Manic-Depression, Mood control   traZODone 50 MG tablet Commonly known as: DESYREL Take 1 tablet (50 mg total) by mouth at bedtime as needed for sleep.  Indication: Trouble Sleeping        Follow-up Information     [facility]. Go to.   Specialty: Behavioral Health Why: You may go to this provider for therapy and medication management services during walk in hours:  Monday through Friday, from 7:45 am to 11:00 am.  Services are provided on a first come, first served basis. Contact information: [address] [place] [address] [phone]               Follow-up recommendations: Activity:  As tolerated Diet: As recommended by your primary care doctor. Keep all scheduled follow-up appointments as recommended.   Comments:  Comments: Shawn Nicholson is recommended to follow-up care on an outpatient basis as noted above. Prescriptions sent to pt's pharmacy of choice at discharge.   Shawn Nicholson agreeable to plan.   Given opportunity to ask questions.   Appears to feel comfortable with discharge denies any current suicidal or homicidal thought. Shawn Nicholson is also instructed prior to discharge to: Take all medications as prescribed by his/her mental healthcare provider. Report any adverse effects and or reactions from the medicines to his/her outpatient provider promptly. Shawn Nicholson has been instructed & cautioned: To not engage in alcohol and or illegal drug use while on prescription medicines. In the event of worsening symptoms, Shawn Nicholson is instructed to call the crisis hotline, 911 and or go to the nearest ED for appropriate evaluation and treatment of symptoms. To follow-up with his/her primary care provider for your other medical issues, concerns and or health care needs.   Signed: Armandina Stammer, NP, pmhnp, fnp-bc 11/09/2021, 10:19 AM

## 2021-11-09 NOTE — BHH Suicide Risk Assessment (Signed)
[facility] Discharge Suicide Risk Assessment   Principal Problem: Bipolar I, most recent episode manic, severe with psychotic behavior ([facility]) Discharge Diagnoses: Principal Problem:   Bipolar I, most recent episode manic, severe with psychotic behavior ([facility]) Active Problems:   Cannabis abuse   Total Time spent with patient: 15 minutes  Musculoskeletal: Strength & Muscle Tone: within normal limits Gait & Station: normal Patient leans: N/A  Psychiatric Specialty Exam  Presentation  General Appearance: Appropriate for Environment; Casual; Fairly Groomed  Eye Contact:Good  Speech:Clear and Coherent; Normal Rate  Speech Volume:Normal  Handedness:Right   Mood and Affect  Mood:Euthymic  Duration of Depression Symptoms: Greater than two weeks  Affect:Congruent; Appropriate   Thought Process  Thought Processes:Coherent; Linear; Goal Directed  Descriptions of Associations:Intact  Orientation:Full (Time, Place and Person)  Thought Content:Logical  History of Schizophrenia/Schizoaffective disorder:No  Duration of Psychotic Symptoms:N/A  Hallucinations:Hallucinations: None Description of Auditory Hallucinations: NA Description of Visual Hallucinations: NA  Ideas of Reference:None  Suicidal Thoughts:Suicidal Thoughts: No  Homicidal Thoughts:Homicidal Thoughts: No   Sensorium  Memory:Immediate Good; Recent Good; Remote Good  Judgment:Good  Insight:Fair   Executive Functions  Concentration:Good  Attention Span:Good  Recall:Good  Fund of Knowledge:Good  Language:Good   Psychomotor Activity  Psychomotor Activity:Psychomotor Activity: Normal   Assets  Assets:Communication Skills; Desire for Improvement; Housing; Physical Health; Resilience; Social Support   Sleep  Sleep:Sleep: Good Number of Hours of Sleep: 6   Physical Exam: Physical Exam Vitals and nursing note reviewed.  Constitutional:      Appearance: Normal appearance.  HENT:     Head:  Normocephalic and atraumatic.  Eyes:     Extraocular Movements: Extraocular movements intact.  Musculoskeletal:        General: Normal range of motion.     Cervical back: Normal range of motion.  Neurological:     General: No focal deficit present.     Mental Status: He is alert and oriented to person, place, and time.  Psychiatric:        Attention and Perception: Attention normal. He perceives auditory hallucinations.        Mood and Affect: Mood normal. Affect is blunt.        Speech: Speech normal.        Behavior: Behavior normal. Behavior is cooperative.        Thought Content: Thought content is delusional. Thought content does not include homicidal or suicidal ideation.        Cognition and Memory: Cognition and memory normal.        Judgment: Judgment is impulsive.   ROS Blood pressure 115/78, pulse (!) 110, temperature 98.7 F (37.1 C), temperature source Oral, resp. rate 18, height 6\' 5"  (1.956 m), weight 81.6 kg, SpO2 98 %. Body mass index is 21.34 kg/m.  Mental Status Per Nursing Assessment::   On Admission:  NA  Demographic Factors:  Male and Low socioeconomic status  Loss Factors: Financial problems/change in socioeconomic status  Historical Factors: Impulsivity and previous psychiatric diagnosis, history of substance abuse  Risk Reduction Factors:   Sense of responsibility to family, Employed, Positive social support, and will be living with mother  Continued Clinical Symptoms:  Bipolar Disorder:   Mixed State Schizophrenia:   Less than 83 years old Currently Psychotic  Cognitive Features That Contribute To Risk:  Closed-mindedness    Suicide Risk:  Minimal: No identifiable suicidal ideation.  Patients presenting with no risk factors but with morbid ruminations; may be classified as minimal risk based on the  severity of the depressive symptoms   Follow-up Information     [facility]. Go to.   Specialty: Behavioral  Health Why: You may go to this provider for therapy and medication management services during walk in hours:  Monday through Friday, from 7:00 am to 11:00 am.  Services are provided on a first come, first served basis. Contact information: [address] [place] [address] [phone]                Plan Of Care/Follow-up recommendations:  Activity:  ad lib Diet:  cardiac diet Other:    Prescriptions for new medications provided for the patient to bridge to follow up appointment. The patient was informed that refills for these prescriptions are generally not provided, and patient is encouraged to attend all follow up appointments to address medication refills and adjustments.   Today's discharge was reviewed with treatment team, and the team is in agreement that the patient is ready for discharge. The patient is was of the discharge plan for today and has been given opportunity to ask questions. At time of discharge, the patient does not vocalize any acute harm to self or others, is goal directed, able to advocate for self and organizational baseline.   At discharge, the patient is instructed to:  Take all medications as prescribed. Report any adverse effects and or reactions from the medicines to her outpatient provider promptly.  Do not engage in alcohol and/or illegal drug use while on prescription medicines.  In the event of worsening symptoms, patient is instructed to call the crisis hotline, 911 and or go to the nearest ED for appropriate evaluation and treatment of symptoms.  Follow-up with primary care provider for further care of medical issues, concerns and or health care needs. * Depakote/Valproic Acid: this medication requires blood monitoring. Please attend your outpatient appointments. Avoid missing doses, if possible.    Roselle Locus, MD 11/09/2021, 10:51 AM

## 2021-11-09 NOTE — BHH Counselor (Addendum)
CSW attempted to contact Mrs. Shawn Nicholson [phone] (Mother) to discuss her visit with her son last night and his discharge today.  There was no answer.  CSW left a HIPAA compliant voicemail asking for a return call.  The Pt was encouraged to contact his family to discuss transportation needs and a discharge time was scheduled for 1:00pm.   CSW spoke with Mrs. Shawn Nicholson at 10:32am and states that his thought process continues to be off.  She states that he agrees to take the medications but she believes he needs more time in the hospital.  Mrs. Shawn Nicholson states that she would rather her son be in the hospital for a week or 2 longer to help the medication get into his system.  She states that she knows that this is not possible.  She states that she will be working with her son's father to make arrangements to help her with getting him to his appointments and making sure he takes his medications.  She states that she will be attempting to make sure that her son is at [facility] for his outpatient follow up tomorrow morning.  Mrs. Shawn Nicholson states that she would also like the discharge time pushed back to 3:30pm to make sure that she will be off work to pick her son up from the hospital.  CSW will discuss this with the Pt as well.

## 2021-11-09 NOTE — Progress Notes (Signed)
°  [facility] Adult Case Management Discharge Plan :  Will you be returning to the same living situation after discharge:  Yes,  Home  At discharge, do you have transportation home?: Yes,  Family  Do you have the ability to pay for your medications: Yes,  Family   Release of information consent forms completed and in the chart;  Patient's signature needed at discharge.  Patient to Follow up at:  Follow-up Information     [facility]. Go to.   Specialty: Behavioral Health Why: You may go to this provider for therapy and medication management services during walk in hours:  Monday through Friday, from 7:00 am to 11:00 am.  Services are provided on a first come, first served basis. Contact information: [address] [place] [address] [phone]                Next level of care provider has access to [facility] Link:yes  Safety Planning and Suicide Prevention discussed: Yes,  with patient and mother      Has patient been referred to the Quitline?: Patient refused referral  Patient has been referred for addiction treatment: N/A  Aram Beecham, LCSWA 11/09/2021, 10:46 AM

## 2021-11-10 ENCOUNTER — Telehealth (HOSPITAL_COMMUNITY): Payer: Self-pay

## 2021-11-10 NOTE — BH Assessment (Signed)
Care Management - Follow Up [facility] Discharges  ° °Patient has been placed in an inpatient psychiatric hospital ([facility]) on 11-01-21 °

## 2021-11-14 ENCOUNTER — Encounter (INDEPENDENT_AMBULATORY_CARE_PROVIDER_SITE_OTHER): Payer: Self-pay

## 2021-12-11 ENCOUNTER — Telehealth (HOSPITAL_COMMUNITY): Payer: Self-pay | Admitting: *Deleted

## 2021-12-11 NOTE — Telephone Encounter (Signed)
LVM PATIENT ISN'T IN OUR SYSTEM UNABLE TO HELP WITH REFILLS

## 2021-12-18 ENCOUNTER — Encounter (HOSPITAL_COMMUNITY): Payer: Self-pay | Admitting: Psychiatry

## 2021-12-18 ENCOUNTER — Other Ambulatory Visit: Payer: Self-pay

## 2021-12-18 ENCOUNTER — Ambulatory Visit (INDEPENDENT_AMBULATORY_CARE_PROVIDER_SITE_OTHER): Payer: No Payment, Other | Admitting: Psychiatry

## 2021-12-18 VITALS — BP 119/72 | HR 67 | Ht 77.5 in | Wt 216.0 lb

## 2021-12-18 DIAGNOSIS — F5105 Insomnia due to other mental disorder: Secondary | ICD-10-CM

## 2021-12-18 DIAGNOSIS — F411 Generalized anxiety disorder: Secondary | ICD-10-CM

## 2021-12-18 DIAGNOSIS — F250 Schizoaffective disorder, bipolar type: Secondary | ICD-10-CM | POA: Diagnosis not present

## 2021-12-18 MED ORDER — TRAZODONE HCL 50 MG PO TABS: 50.0000 mg | ORAL_TABLET | Freq: Every evening | ORAL | 2 refills | Status: DC | PRN

## 2021-12-18 MED ORDER — HYDROXYZINE HCL 25 MG PO TABS: 25.0000 mg | ORAL_TABLET | Freq: Three times a day (TID) | ORAL | 2 refills | Status: DC | PRN

## 2021-12-18 MED ORDER — OLANZAPINE 20 MG PO TABS: 20.0000 mg | ORAL_TABLET | Freq: Every day | ORAL | 2 refills | Status: DC

## 2021-12-18 MED ORDER — DIVALPROEX SODIUM ER 500 MG PO TB24: 1000.0000 mg | ORAL_TABLET | Freq: Every day | ORAL | 2 refills | Status: DC

## 2021-12-18 NOTE — Progress Notes (Signed)
Psychiatric Initial Adult Assessment   Patient Identification: Shawn Nicholson MRN:  [id number] Date of Evaluation:  12/19/2021 Referral Source: unknown Chief Complaint:   Chief Complaint   WALK-IN    Visit Diagnosis:    ICD-10-CM   1. Schizoaffective disorder, bipolar type ([place])  F25.0     2. Insomnia due to mental condition  F51.05     3. Anxiety state  F41.1       History of Present Illness:  Shawn Nicholson is a 41 year old male presenting to [facility] for an initial evaluation, accompanied by his mother. Patient is alert and oriented, stating that he is "excited about his future" and he is "ready to start working out". He is dressed appropriately for the weather and well groomed. He is pleasantly delusional, reporting auditory hallucinations, with a onset in his childhood. Patient stated, "it started when I was young but I grew out of it", referring to his auditory hallucinations.  Shawn Nicholson reports "I hear stuff in a distance but can't always make out what it's saying". Auditory hallucinations are not commanding in nature. Patient reports that his auditory hallucinations have decreased in frequency since his discharge from the [facility] inpatient unit. He reports that he continues to hear birds talking to other animals. Patient stated "my ancestors were real close to animals" as to the reason why he is able to hear birds talking. Patient reported that he continues to talk to dogs and cats in Spanish and he reports that he believes birds are starting to learn the [place] language because they're able to say "pretty bird and good morning" when he goes outside. Patient stated the birds are "similar to a blue [place]". Shawn Nicholson has a previous diagnosis of schizoaffective disorder, bipolar type with multiple inpatient psychiatric hospitalizations. His most recent discharge was on 11/09/2021.  He reports that symptoms are managed with medications but he is  unable to recall the names. Per patient's medical record, patient's medication regimen consists of Depakote ER 500 mg  2 tabs bedtime, Hydroxyzine 25 mg TID PRN anxiety, Zyprexa 20 mg QHS, Trazodone 50 mg QHS PRN. Patient reports that his current dosage is effective at managing his symptoms. Patient reports medication compliance and denies adverse reactions.  Patients denies labile mood, irritability, suicidal or homicidal ideations, or paranoia. Patient denies visual hallucinations.    Associated Signs/Symptoms: Depression Symptoms:   None (Hypo) Manic Symptoms:  Delusions, Hallucinations, Anxiety Symptoms:   None Psychotic Symptoms:  Delusions, Hallucinations: Auditory PTSD Symptoms: Negative  Past Psychiatric History: Schizoaffective Disorder, bipolar type  Previous Psychotropic Medications: Yes  Depakote ER 500 mg  2 tabs bedtime, Hydroxyzine 25 mg TID PRN anxiety, Zyprexa 20 mg QHS, Trazodone 50 mg QHS PRN.  Substance Abuse History in the last 12 months:  Yes.     Consequences of Substance Abuse: Negative  Past Medical History:  Past Medical History:  Diagnosis Date   Allergy    Anxiety    no meds   Boxer's fracture    RLF   Depression     Past Surgical History:  Procedure Laterality Date   CLOSED REDUCTION METACARPAL WITH PERCUTANEOUS PINNING  10/23/2012   Procedure: CLOSED REDUCTION METACARPAL WITH PERCUTANEOUS PINNING;  Surgeon: Tennis Must, MD;  Location: [facility];  Service: Orthopedics;  Laterality: Right;   OPEN REDUCTION INTERNAL FIXATION (ORIF) METACARPAL  10/23/2012   Procedure: OPEN REDUCTION INTERNAL FIXATION (ORIF) METACARPAL;  Surgeon: Tennis Must, MD;  Location: [facility];  Service: Orthopedics;;  RIGHT SMALL AND RING FINGER ORIF VERSUS CLOSED REDUCTION PERCUTANEOUS PINNING    Family Psychiatric History: None known  Family History:  Family History  Problem Relation Age of Onset   Hypertension Father    Arthritis  Mother    Arthritis Other    Hypertension Other    Cancer Neg Hx    Alcohol abuse Neg Hx    Depression Neg Hx    Diabetes Neg Hx    Drug abuse Neg Hx    Early death Neg Hx    Heart disease Neg Hx    Hyperlipidemia Neg Hx    Kidney disease Neg Hx    Stroke Neg Hx     Social History:  Burleigh Hairfield reported that he graduated high school and completed one semester at [employer]. He is a single male with no children. He works for [employer] and reports having friends that are a good social support system. He lives with his mother. He reports Marijuana use, with last use yesterday. "I smoke half a blunt every few days". He reported occasional alcohol use reporting a "shot of Tequila weekly, if that". He reports smoking Kepple and mild cigars and voiced that he plans to minimize use. Social History   Socioeconomic History   Marital status: Single    Spouse name: Not on file   Number of children: Not on file   Years of education: Not on file   Highest education level: Not on file  Occupational History   Not on file  Tobacco Use   Smoking status: Light Smoker    Types: Cigarettes   Smokeless tobacco: Never  Vaping Use   Vaping Use: Never used  Substance and Sexual Activity   Alcohol use: Yes    Alcohol/week: 3.0 standard drinks    Types: 3 Cans of beer per week    Comment: social   Drug use: Yes    Types: Marijuana   Sexual activity: Not Currently  Other Topics Concern   Not on file  Social History Narrative   Not on file   Social Determinants of Health   Financial Resource Strain: Not on file  Food Insecurity: Not on file  Transportation Needs: Not on file  Physical Activity: Not on file  Stress: Not on file  Social Connections: Not on file    Additional Social History: None  Allergies:   Allergies  Allergen Reactions   Other Other (See Comments)    Reaction to pecans showed up on allergy test   Penicillins Rash    Metabolic Disorder  Labs: Lab Results  Component Value Date   HGBA1C 4.9 10/30/2021   MPG 93.93 10/30/2021   MPG 96.8 11/13/2018   No results found for: PROLACTIN Lab Results  Component Value Date   CHOL 131 10/30/2021   TRIG 36 10/30/2021   HDL 37 (L) 10/30/2021   CHOLHDL 3.5 10/30/2021   VLDL 7 10/30/2021   LDLCALC 87 10/30/2021   LDLCALC 112 (H) 11/13/2018   Lab Results  Component Value Date   TSH 0.413 10/30/2021    Therapeutic Level Labs: No results found for: LITHIUM No results found for: CBMZ Lab Results  Component Value Date   VALPROATE 80 11/07/2021    Current Medications: Current Outpatient Medications  Medication Sig Dispense Refill   divalproex (DEPAKOTE ER) 500 MG 24 hr tablet Take 2 tablets (1,000 mg total) by mouth at bedtime. For mood stabilization 60 tablet 2  hydrOXYzine (ATARAX) 25 MG tablet Take 1 tablet (25 mg total) by mouth 3 (three) times daily as needed for anxiety. 75 tablet 2   OLANZapine (ZYPREXA) 20 MG tablet Take 1 tablet (20 mg total) by mouth at bedtime. For mood control 30 tablet 2   traZODone (DESYREL) 50 MG tablet Take 1 tablet (50 mg total) by mouth at bedtime as needed for sleep. 30 tablet 2   No current facility-administered medications for this visit.    Musculoskeletal: Strength & Muscle Tone: within normal limits Gait & Station: normal Patient leans: N/A  Psychiatric Specialty Exam: Review of Systems  Psychiatric/Behavioral:  Positive for hallucinations. Negative for agitation, behavioral problems, confusion, decreased concentration, dysphoric mood, self-injury, sleep disturbance and suicidal ideas. The patient is not nervous/anxious and is not hyperactive.   All other systems reviewed and are negative.  Blood pressure 119/72, pulse 67, height 6' 5.5" (1.969 m), weight 216 lb (98 kg).Body mass index is 25.28 kg/m.  General Appearance: Casual and Well Groomed  Eye Contact:  Good  Speech:  Clear and Coherent  Volume:  Normal  Mood:   Euthymic  Affect:  Appropriate and Congruent  Thought Process:  Goal Directed  Orientation:  Full (Time, Place, and Person)  Thought Content:   Circumstantial  Suicidal Thoughts:  No  Homicidal Thoughts:  No  Memory:  Immediate;   Good Recent;   Good Remote;   Good  Judgement:  Fair  Insight:  Good  Psychomotor Activity:  Normal  Concentration:  Concentration: Good and Attention Span: Good  Recall:  Good  Fund of Knowledge:Good  Language: Good  Akathisia:  Negative  Handed:  Right  AIMS (if indicated):  done  Assets:  Communication Skills Desire for Improvement Housing Social Support  ADL's:  Intact  Cognition: WNL  Sleep:  Good   Screenings: AIMS    Flowsheet Row Admission (Discharged) from 11/01/2021 in [place] [facility] Admission (Discharged) from OP Visit from 11/13/2018 in [place] [facility]  AIMS Total Score 0 0      AUDIT    Flowsheet Row Admission (Discharged) from 11/01/2021 in [place] [facility] Admission (Discharged) from OP Visit from 11/13/2018 in [place] [facility]  Alcohol Use Disorder Identification Test Final Score (AUDIT) 3 0      PHQ2-9    [place] Office Visit from 12/18/2021 in [facility] Office Visit from 05/25/2015 in [place]  PHQ-2 Total Score 0 0      [place] Office Visit from 12/18/2021 in [facility] Admission (Discharged) from 11/01/2021 in [place] [facility] ED from 10/30/2021 in [place] No Risk No Risk No Risk       Assessment and Plan:   Chamberlain Markson is a 41 year old male presenting to [facility] for an initial evaluation and medication management. Patient presents delusional and experiencing auditory hallucinations  which patient describes as "I'm used to it". Auditory hallucinations are not overbearing per patient report and he declines medication adjustments at this time. He reports that medications are effective at his current dosage and reports a decrease in the frequency of auditory hallucinations since 11/09/21, when he discharged from his most recent inpatient psychiatric hospitalization. 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 Patient reported being low on medications. Medication refills e-scribed to patient's  preferred pharmacy. Patient will return to care in 3 months or as needed if symptoms worsen.  Diagnosis/Treatment:  Schizoaffective Disorder, bipolar type F25.0 Medication: Zyprexa 20 mg at bedtime, Depakot ER 500 mg two tablets at bedtime  2. Anxiety State F41.1 Hydroxyzine 25 mg three times daily as needed for anxiety  3. Insomnia due to mental condition F51.05 Trazodone 50 mg at bedtime as needed for sleep    Meds ordered this encounter  Medications   divalproex (DEPAKOTE ER) 500 MG 24 hr tablet    Sig: Take 2 tablets (1,000 mg total) by mouth at bedtime. For mood stabilization    Dispense:  60 tablet    Refill:  2    Order Specific Question:   Supervising Provider    Answer:   Hampton Abbot [[id number]]   hydrOXYzine (ATARAX) 25 MG tablet    Sig: Take 1 tablet (25 mg total) by mouth 3 (three) times daily as needed for anxiety.    Dispense:  75 tablet    Refill:  2    Order Specific Question:   Supervising Provider    Answer:   Dwyane Dee, ARCHANA [[id number]]   OLANZapine (ZYPREXA) 20 MG tablet    Sig: Take 1 tablet (20 mg total) by mouth at bedtime. For mood control    Dispense:  30 tablet    Refill:  2    Order Specific Question:   Supervising Provider    Answer:   Hampton Abbot [id number]   traZODone (DESYREL) 50 MG tablet    Sig: Take 1 tablet (50 mg total) by mouth at bedtime as needed for sleep.    Dispense:  30 tablet    Refill:  2    Order Specific  Question:   Supervising Provider    Answer:   Hampton Abbot [id number]      Franne Grip, NP 2/7/20231:41 AM

## 2022-03-12 ENCOUNTER — Ambulatory Visit (HOSPITAL_COMMUNITY): Payer: No Payment, Other | Admitting: Licensed Clinical Social Worker

## 2022-03-14 ENCOUNTER — Telehealth (INDEPENDENT_AMBULATORY_CARE_PROVIDER_SITE_OTHER): Payer: No Payment, Other | Admitting: Psychiatry

## 2022-03-14 DIAGNOSIS — F250 Schizoaffective disorder, bipolar type: Secondary | ICD-10-CM | POA: Diagnosis not present

## 2022-03-14 MED ORDER — TRAZODONE HCL 50 MG PO TABS: 50.0000 mg | ORAL_TABLET | Freq: Every evening | ORAL | 2 refills | Status: DC | PRN

## 2022-03-14 MED ORDER — HYDROXYZINE HCL 25 MG PO TABS: 25.0000 mg | ORAL_TABLET | Freq: Three times a day (TID) | ORAL | 2 refills | Status: DC | PRN

## 2022-03-14 MED ORDER — OLANZAPINE 20 MG PO TABS: 20.0000 mg | ORAL_TABLET | Freq: Every day | ORAL | 2 refills | Status: DC

## 2022-03-14 MED ORDER — DIVALPROEX SODIUM ER 500 MG PO TB24: 1000.0000 mg | ORAL_TABLET | Freq: Every day | ORAL | 2 refills | Status: DC

## 2022-03-14 NOTE — Progress Notes (Signed)
BH MD/PA/NP OP Progress Note ? ?03/14/2022 5:41 PM ?[place]  ?MRN:  [id number] ? ?Virtual Visit via Video Note ? ?I connected with Ercil M Yankee on 03/14/22 at  1:00 PM EDT by a video enabled telemedicine application and verified that I am speaking with the correct person using two identifiers. ? ?Location: ?Patient: home ?Provider: offsite ?  ?I discussed the limitations of evaluation and management by telemedicine and the availability of in person appointments. The patient expressed understanding and agreed to proceed. ? ?  ?I discussed the assessment and treatment plan with the patient. The patient was provided an opportunity to ask questions and all were answered. The patient agreed with the plan and demonstrated an understanding of the instructions. ?  ?The patient was advised to call back or seek an in-person evaluation if the symptoms worsen or if the condition fails to improve as anticipated. ? ?I provided 5 minutes of non-face-to-face time during this encounter. ? ? ?Franne Grip, NP  ? ?Chief Complaint: Medication management ? ?HPI: Ferman Delaguila is a 41 year old male presenting to [facility] behavioral health outpatient for follow-up psychiatric evaluation.  Patient has a psychiatric history of schizoaffective affective disorder, bipolar type.  Patient is symptoms are managed with Depakote 1000 mg at bedtime, hydroxyzine 25 mg 3 times daily as needed for anxiety, Zyprexa 20 mg daily at bedtime and trazodone 50 mg at bedtime as needed for sleep.  Patient reports that medications are effective and he is medication compliant.  Patient denies adverse medication reactions or the need for dosage adjustment today. ? ? ?Visit Diagnosis:  ?  ICD-10-CM   ?1. Schizoaffective disorder, bipolar type ([place])  F25.0   ?  ? ? ?Past Psychiatric History: Schizoaffective disorder, bipolar type ? ?Past Medical History:  ?Past Medical History:  ?Diagnosis Date  ? Allergy   ? Anxiety   ? no meds  ? Boxer's fracture    ? RLF  ? Depression   ?  ?Past Surgical History:  ?Procedure Laterality Date  ? CLOSED REDUCTION METACARPAL WITH PERCUTANEOUS PINNING  10/23/2012  ? Procedure: CLOSED REDUCTION METACARPAL WITH PERCUTANEOUS PINNING;  Surgeon: Tennis Must, MD;  Location: [facility];  Service: Orthopedics;  Laterality: Right;  ? OPEN REDUCTION INTERNAL FIXATION (ORIF) METACARPAL  10/23/2012  ? Procedure: OPEN REDUCTION INTERNAL FIXATION (ORIF) METACARPAL;  Surgeon: Tennis Must, MD;  Location: [facility];  Service: Orthopedics;;  RIGHT SMALL AND RING FINGER ORIF VERSUS CLOSED REDUCTION PERCUTANEOUS PINNING  ? ? ?Family Psychiatric History: N/A ? ?Family History:  ?Family History  ?Problem Relation Age of Onset  ? Hypertension Father   ? Arthritis Mother   ? Arthritis Other   ? Hypertension Other   ? Cancer Neg Hx   ? Alcohol abuse Neg Hx   ? Depression Neg Hx   ? Diabetes Neg Hx   ? Drug abuse Neg Hx   ? Early death Neg Hx   ? Heart disease Neg Hx   ? Hyperlipidemia Neg Hx   ? Kidney disease Neg Hx   ? Stroke Neg Hx   ? ? ?Social History:  ?Social History  ? ?Socioeconomic History  ? Marital status: Single  ?  Spouse name: Not on file  ? Number of children: Not on file  ? Years of education: Not on file  ? Highest education level: Not on file  ?Occupational History  ? Not on file  ?Tobacco Use  ? Smoking status: Light Smoker  ?  Types: Cigarettes  ? Smokeless tobacco: Never  ?Vaping Use  ? Vaping Use: Never used  ?Substance and Sexual Activity  ? Alcohol use: Yes  ?  Alcohol/week: 3.0 standard drinks  ?  Types: 3 Cans of beer per week  ?  Comment: social  ? Drug use: Yes  ?  Types: Marijuana  ? Sexual activity: Not Currently  ?Other Topics Concern  ? Not on file  ?Social History Narrative  ? Not on file  ? ?Social Determinants of Health  ? ?Financial Resource Strain: Not on file  ?Food Insecurity: Not on file  ?Transportation Needs: Not on file  ?Physical Activity: Not on file  ?Stress: Not on file   ?Social Connections: Not on file  ? ? ?Allergies:  ?Allergies  ?Allergen Reactions  ? Other Other (See Comments)  ?  Reaction to pecans showed up on allergy test  ? Penicillins Rash  ? ? ?Metabolic Disorder Labs: ?Lab Results  ?Component Value Date  ? HGBA1C 4.9 10/30/2021  ? MPG 93.93 10/30/2021  ? MPG 96.8 11/13/2018  ? ?No results found for: PROLACTIN ?Lab Results  ?Component Value Date  ? CHOL 131 10/30/2021  ? TRIG 36 10/30/2021  ? HDL 37 (L) 10/30/2021  ? CHOLHDL 3.5 10/30/2021  ? VLDL 7 10/30/2021  ? [place] 87 10/30/2021  ? LDLCALC 112 (H) 11/13/2018  ? ?Lab Results  ?Component Value Date  ? TSH 0.413 10/30/2021  ? TSH 0.387 11/13/2018  ? ? ?Therapeutic Level Labs: ?No results found for: LITHIUM ?Lab Results  ?Component Value Date  ? VALPROATE 80 11/07/2021  ? ?No components found for:  CBMZ ? ?Current Medications: ?Current Outpatient Medications  ?Medication Sig Dispense Refill  ? divalproex (DEPAKOTE ER) 500 MG 24 hr tablet Take 2 tablets (1,000 mg total) by mouth at bedtime. For mood stabilization 60 tablet 2  ? hydrOXYzine (ATARAX) 25 MG tablet Take 1 tablet (25 mg total) by mouth 3 (three) times daily as needed for anxiety. 75 tablet 2  ? OLANZapine (ZYPREXA) 20 MG tablet Take 1 tablet (20 mg total) by mouth at bedtime. For mood control 30 tablet 2  ? traZODone (DESYREL) 50 MG tablet Take 1 tablet (50 mg total) by mouth at bedtime as needed for sleep. 30 tablet 2  ? ?No current facility-administered medications for this visit.  ? ? ? ?Musculoskeletal: ?Strength & Muscle Tone: N/A virtual visit ?Gait & Station: N/A virtual visit ?Patient leans: N/A ? ?Psychiatric Specialty Exam: ?Review of Systems  ?Psychiatric/Behavioral:  Negative for hallucinations, self-injury and suicidal ideas.   ?All other systems reviewed and are negative.  ?There were no vitals taken for this visit.There is no height or weight on file to calculate BMI.  ?General Appearance: Well-groomed  ?Eye Contact:  Good  ?Speech:  Clear and  Coherent  ?Volume:  Normal  ?Mood:  Euthymic  ?Affect:  Congruent  ?Thought Process: Goal directed  ?Orientation:  Full (Time, Place, and Person)  ?Thought Content: Logical   ?Suicidal Thoughts:  No  ?Homicidal Thoughts:  No  ?Memory: Good  ?Judgement: Good  ?Insight: Good  ?Psychomotor Activity: N/A  ?Concentration: Good  ?Recall: Good  ?Fund of Knowledge: Good  ?Language: Good  ?Akathisia:  NA  ?Handed:  Right  ?AIMS (if indicated): not done  ?Assets:  Communication Skills ?Desire for Improvement  ?ADL's:  Intact  ?Cognition: WNL  ?Sleep:  Good  ? ?Screenings: ?AIMS   ? ?Flowsheet Row Admission (Discharged) from 11/01/2021 in [place]  [facility] Admission (Discharged) from OP Visit from 11/13/2018 in  [facility] [facility]  ?AIMS Total Score 0 0  ? ?  ? ?AUDIT   ? ?Flowsheet Row Admission (Discharged) from 11/01/2021 in [place] [facility] Admission (Discharged) from OP Visit from 11/13/2018 in [place] [facility]  ?Alcohol Use Disorder Identification Test Final Score (AUDIT) 3 0  ? ?  ? ?PHQ2-9   ? ?[place] Office Visit from 12/18/2021 in [facility] Office Visit from 05/25/2015 in [place]  ?PHQ-2 Total Score 0 0  ? ?  ? ?[place] Office Visit from 12/18/2021 in [facility] Admission (Discharged) from 11/01/2021 in [place] [facility] ED from 10/30/2021 in [facility]  ?C-SSRS RISK CATEGORY No Risk No Risk No Risk  ? ?  ? ? ? ?Assessment and Plan: Alhaji Paullin is a 41 year old male presenting to [facility] behavioral health outpatient for follow-up psychiatric evaluation.  Patient has a psychiatric history of schizoaffective affective disorder, bipolar type.  Patient symptoms are managed with Depakote 1000 mg at bedtime, hydroxyzine 25 mg 3 times daily as needed for  anxiety, Zyprexa 20 mg daily at bedtime and trazodone 50 mg at bedtime as needed for sleep.  Patient reports that medications are effective and he is medication compliant.  Patient denies adverse medica

## 2022-04-02 ENCOUNTER — Ambulatory Visit (HOSPITAL_COMMUNITY): Payer: No Payment, Other | Admitting: Licensed Clinical Social Worker

## 2022-06-12 ENCOUNTER — Telehealth (HOSPITAL_COMMUNITY): Payer: No Payment, Other | Admitting: Psychiatry

## 2022-07-11 ENCOUNTER — Telehealth (HOSPITAL_COMMUNITY): Payer: Self-pay

## 2022-07-11 ENCOUNTER — Other Ambulatory Visit (HOSPITAL_COMMUNITY): Payer: Self-pay | Admitting: Psychiatry

## 2022-07-11 MED ORDER — OLANZAPINE 20 MG PO TABS: 20.0000 mg | ORAL_TABLET | Freq: Every day | ORAL | 2 refills | Status: DC

## 2022-07-11 MED ORDER — HYDROXYZINE HCL 25 MG PO TABS: 25.0000 mg | ORAL_TABLET | Freq: Three times a day (TID) | ORAL | 2 refills | Status: DC | PRN

## 2022-07-11 MED ORDER — DIVALPROEX SODIUM ER 500 MG PO TB24: 1000.0000 mg | ORAL_TABLET | Freq: Every day | ORAL | 2 refills | Status: DC

## 2022-07-11 MED ORDER — TRAZODONE HCL 50 MG PO TABS: 50.0000 mg | ORAL_TABLET | Freq: Every evening | ORAL | 2 refills | Status: DC | PRN

## 2022-07-11 NOTE — Telephone Encounter (Signed)
Medication refilled and sent to preferred pharmacy.  Provider left message informing patient that he will need to reschedule an appointment for future refills.

## 2022-08-07 ENCOUNTER — Other Ambulatory Visit (HOSPITAL_COMMUNITY): Payer: Self-pay | Admitting: Psychiatry

## 2022-08-07 ENCOUNTER — Telehealth (HOSPITAL_COMMUNITY): Payer: Self-pay | Admitting: Psychiatry

## 2022-08-07 MED ORDER — OLANZAPINE 20 MG PO TABS: 20.0000 mg | ORAL_TABLET | Freq: Every day | ORAL | 2 refills | Status: DC

## 2022-08-07 MED ORDER — TRAZODONE HCL 50 MG PO TABS: 50.0000 mg | ORAL_TABLET | Freq: Every evening | ORAL | 2 refills | Status: DC | PRN

## 2022-08-07 MED ORDER — HYDROXYZINE HCL 25 MG PO TABS: 25.0000 mg | ORAL_TABLET | Freq: Three times a day (TID) | ORAL | 2 refills | Status: DC | PRN

## 2022-08-07 MED ORDER — DIVALPROEX SODIUM ER 500 MG PO TB24: 1000.0000 mg | ORAL_TABLET | Freq: Every day | ORAL | 2 refills | Status: DC

## 2022-08-07 NOTE — Telephone Encounter (Signed)
Medications refilled and sent to preferred pharmacy.  Patient will need to schedule a follow-up visit for further refills.

## 2022-11-18 ENCOUNTER — Other Ambulatory Visit (HOSPITAL_COMMUNITY): Payer: Self-pay | Admitting: Psychiatry

## 2023-02-20 ENCOUNTER — Other Ambulatory Visit (HOSPITAL_COMMUNITY): Payer: Self-pay | Admitting: Psychiatry

## 2023-05-28 ENCOUNTER — Other Ambulatory Visit (HOSPITAL_COMMUNITY): Payer: Self-pay | Admitting: Psychiatry

## 2023-09-02 ENCOUNTER — Other Ambulatory Visit (HOSPITAL_COMMUNITY): Payer: Self-pay | Admitting: Psychiatry

## 2024-01-07 ENCOUNTER — Ambulatory Visit (INDEPENDENT_AMBULATORY_CARE_PROVIDER_SITE_OTHER): Payer: No Payment, Other

## 2024-01-07 DIAGNOSIS — F1210 Cannabis abuse, uncomplicated: Secondary | ICD-10-CM

## 2024-01-07 DIAGNOSIS — F250 Schizoaffective disorder, bipolar type: Secondary | ICD-10-CM | POA: Diagnosis not present

## 2024-01-07 NOTE — Progress Notes (Signed)
 Comprehensive Clinical Assessment (CCA) Note  01/07/2024 Shawn Nicholson [id number]  Chief Complaint:  Chief Complaint  Patient presents with   schizoaffective Disorder   Visit Diagnosis: Schizoaffective Disorder, Bi-Polar Type   CCA Screening, Triage and Referral (STR)  Patient Reported Information How did you hear about [place]? Other (Comment) (had previously been engaged with [facility])  Referral name: No data recorded Referral phone number: No data recorded  Whom do you see for routine medical problems? I don't have a doctor  Practice/Facility Name: No data recorded Practice/Facility Phone Number: No data recorded Name of Contact: No data recorded Contact Number: No data recorded Contact Fax Number: No data recorded Prescriber Name: No data recorded Prescriber Address (if known): No data recorded  What Is the Reason for Your Visit/Call Today? Pt presents as a walk in today.  He says his mother would like him to get back on his medications.  He carries a dx of schizoaffective Disorder, bipolar type.  He admits to "kinda of hearing voices" He is unsure what the voices say but denies command hallucinations.  He says he is not sure if people are out to get him. Shawn Nicholson appears to have difficulty with thought processing today. Shawn Nicholson also carries a dx of Cannabis abuse. How Long Has This Been Causing You Problems? > than 6 months  What Do You Feel Would Help You the Most Today? Medication(s)   Have You Recently Been in Any Inpatient Treatment (Hospital/Detox/Crisis Center/28-Day Program)? No  Name/Location of Program/Hospital:No data recorded How Long Were You There? No data recorded When Were You Discharged? No data recorded  Have You Ever Received Services From [facility] Before? Yes  Who Do You See at [facility]? NP not here any longer   Have You Recently Had Any Thoughts About Hurting Yourself? No  Are You Planning to Commit Suicide/Harm Yourself At This time?  No   Have you Recently Had Thoughts About Hurting Someone Shawn Nicholson? No  Explanation: No data recorded  Have You Used Any Alcohol or Drugs in the Past 24 Hours? No  How Long Ago Did You Use Drugs or Alcohol? No data recorded What Did You Use and How Much? No data recorded  Do You Currently Have a Therapist/Psychiatrist? No  Name of Therapist/Psychiatrist: No data recorded  Have You Been Recently Discharged From Any Office Practice or Programs? No  Explanation of Discharge From Practice/Program: No data recorded    CCA Screening Triage Referral Assessment Type of Contact: Face-to-Face  Is this Initial or Reassessment? No data recorded Date Telepsych consult ordered in [facility]:  No data recorded Time Telepsych consult ordered in [facility]:  No data recorded  Patient Reported Information Reviewed? No data recorded Patient Left Without Being Seen? No data recorded Reason for Not Completing Assessment: No data recorded  Collateral Involvement: No data recorded  Does Patient Have a Court Appointed Legal Guardian? No data recorded Name and Contact of Legal Guardian: No data recorded If Minor and Not Living with Parent(s), Who has Custody? No data recorded Is CPS involved or ever been involved? Never  Is APS involved or ever been involved? Never   Patient Determined To Be At Risk for Harm To Self or Others Based on Review of Patient Reported Information or Presenting Complaint? No  Method: No Plan  Availability of Means: No access or NA  Intent: Vague intent or NA  Notification Required: No need or identified person  Additional Information for Danger to Others Potential: No data recorded Additional  Comments for Danger to Others Potential: No data recorded Are There Guns or Other Weapons in Your Home? No  Types of Guns/Weapons: No data recorded Are These Weapons Safely Secured?                            No data recorded Who Could Verify You Are Able To Have These Secured: No data  recorded Do You Have any Outstanding Charges, Pending Court Dates, Parole/Probation? No data recorded Contacted To Inform of Risk of Harm To Self or Others: No data recorded  Location of Assessment: [facility]   Does Patient Present under Involuntary Commitment? No data recorded IVC Papers Initial File Date: No data recorded  [place] of Residence: No data recorded  Patient Currently Receiving the Following Services: Not Receiving Services   Determination of Need: Routine (7 days)   Options For Referral: Medication Management     CCA Biopsychosocial Intake/Chief Complaint:  schizoaffective Disorder  Current Symptoms/Problems: having hallucinations but is not sure what they are saying. Denies command halluciatnion   Patient Reported Schizophrenia/Schizoaffective Diagnosis in Past: Yes   Strengths: Motivated to seek help  Preferences: draw, several kinds of music.  Abilities: likes to meditate, play games, be outdoors   Type of Services Patient Feels are Needed: medication   Initial Clinical Notes/Concerns: No data recorded  Mental Health Symptoms Depression:  Difficulty Concentrating; Change in energy/activity; Fatigue; Increase/decrease in appetite; Irritability; Sleep (too much or little)   Duration of Depressive symptoms: Greater than two weeks   Mania:  Increased Energy; Irritability; Racing thoughts; Euphoria; Change in energy/activity (last manic:2-3 weeks ago)   Anxiety:   None   Psychosis:  Hallucinations   Duration of Psychotic symptoms: Greater than six months   Trauma:  None   Obsessions:  None   Compulsions:  No data recorded  Inattention:  None   Hyperactivity/Impulsivity:  No data recorded  Oppositional/Defiant Behaviors:  None   Emotional Irregularity:  None   Other Mood/Personality Symptoms:  No data recorded   Mental Status Exam Appearance and self-care  Stature:  Tall   Weight:  Average weight   Clothing:   Casual   Grooming:  Normal   Cosmetic use:  No data recorded  Posture/gait:  Other (Comment)   Motor activity:  Not Remarkable   Sensorium  Attention:  Normal; Distractible   Concentration:  Scattered; Preoccupied   Orientation:  X5   Recall/memory:  Normal   Affect and Mood  Affect:  Restricted   Mood:  Other (Comment) ("allright")   Relating  Eye contact:  Fleeting   Facial expression:  Constricted   Attitude toward examiner:  Cooperative   Thought and Language  Speech flow: Clear and Coherent   Thought content:  Appropriate to Mood and Circumstances   Preoccupation:  None   Hallucinations:  Auditory   Organization:  No data recorded  [employer] of Knowledge:  Average   Intelligence:  Above Average   Abstraction:  Normal   Judgement:  Normal   Reality Testing:  Adequate   Insight:  Lacking   Decision Making:  Confused   Social Functioning  Social Maturity:  Impulsive   Social Judgement:  Normal   Stress  Stressors:  -- ("is trying to figure out the past and future")   Coping Ability:  Overwhelmed   Skill Deficits:  Decision making; Interpersonal; Responsibility   Supports:  Family  Religion: Religion/Spirituality Are You A Religious Person?: Yes  Leisure/Recreation: Leisure / Recreation Do You Have Hobbies?: Yes Leisure and Hobbies: walking and bike riding,   traveling, makes music, plays music, arts & crafts, sculptures, shopping, eating out, dancing,  Exercise/Diet: Exercise/Diet Do You Exercise?: No Have You Gained or Lost A Significant Amount of Weight in the Past Six Months?: No Do You Follow a Special Diet?: No Do You Have Any Trouble Sleeping?: Yes   CCA Employment/Education Employment/Work Situation: Employment / Work Situation Employment Situation: Unemployed Patient's Job has Been Impacted by Current Illness: No What is the Longest Time Patient has Held a Job?: 11-1/2 years 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?: No  Education: Education Is Patient Currently Attending School?: No Last Grade Completed: 12 Did Garment/textile technologist From [employer]?: Yes Did Theme park manager?: Yes What Type of College Degree Do you Have?: none "did some college" Did You Attend Graduate School?: No Did You Have An Individualized Education Program (IIEP): No Did You Have Any Difficulty At School?: No Patient's Education Has Been Impacted by Current Illness: No   CCA Family/Childhood History Family and Relationship History: Family history Are you sexually active?: Yes What is your sexual orientation?: Heterosexual Does patient have children?: No  Childhood History:  Childhood History By whom was/is the patient raised?: Other (Comment), Mother/father and step-parent, Grandparents Additional childhood history information: Mother was in prison for 21 years while he grew up, just got out last year.  He was raised between his grandparents, father, stepmother, and aunt Description of patient's relationship with caregiver when they were a child: Good relationships with all, closeknit family. Patient's description of current relationship with people who raised him/her: "good" How were you disciplined when you got in trouble as a child/adolescent?: Whooped, put in time out Does patient have siblings?: Yes Number of Siblings: 5 Description of patient's current relationship with siblings: All are distant now that they have their own families. Did patient suffer any verbal/emotional/physical/sexual abuse as a child?: No Did patient suffer from severe childhood neglect?: No Has patient ever been sexually abused/assaulted/raped as an adolescent or adult?: No Was the patient ever a victim of a crime or a disaster?: No Witnessed domestic violence?: No     CCA Substance Use Alcohol/Drug Use: Cannabis Unable to provide clear information most  probably due to difficulties with thought processing   DSM5 Diagnoses: Patient Active Problem List   Diagnosis Date Noted   Schizoaffective disorder, bipolar type (HCC) 11/02/2021   Cannabis abuse 11/02/2021   Acute psychosis (HCC) 12/05/2017   Routine general medical examination at a health care facility 11/21/2012   Allergic rhinitis due to other allergen 06/01/2009    Patient Centered Plan: Patient is on the following Treatment Plan(s):  medication management  Jezreel plans to walk in tomorrow a.m. 01-08-24 to see a medication provider.  Referrals to Alternative Service(s): Referred to Alternative Service(s):   Place:   Date:   Time:    Referred to Alternative Service(s):   Place:   Date:   Time:    Referred to Alternative Service(s):   Place:   Date:   Time:    Referred to Alternative Service(s):   Place:   Date:   Time:      Collaboration of Care: n/a  Patient/Guardian was advised Release of Information must be obtained prior to any record release in order to collaborate their care with an outside provider. Patient/Guardian  was advised if they have not already done so to contact the registration department to sign all necessary forms in order for [place] to release information regarding their care.   Consent: Patient/Guardian gives verbal consent for treatment and assignment of benefits for services provided during this visit. Patient/Guardian expressed understanding and agreed to proceed.   Remigio Eisenmenger, MS, LMFT, LCAS

## 2024-01-08 ENCOUNTER — Telehealth (HOSPITAL_COMMUNITY): Payer: Self-pay | Admitting: *Deleted

## 2024-01-08 ENCOUNTER — Ambulatory Visit (HOSPITAL_COMMUNITY)
Admission: EM | Admit: 2024-01-08 | Discharge: 2024-01-09 | Disposition: A | Discharge status: Other Acute Inpatient Hospital | Payer: No Typology Code available for payment source | Attending: Psychiatry | Admitting: Psychiatry

## 2024-01-08 ENCOUNTER — Encounter (HOSPITAL_COMMUNITY): Payer: Self-pay | Admitting: Physician Assistant

## 2024-01-08 ENCOUNTER — Ambulatory Visit (INDEPENDENT_AMBULATORY_CARE_PROVIDER_SITE_OTHER): Payer: Self-pay | Admitting: Physician Assistant

## 2024-01-08 VITALS — BP 137/78 | HR 74 | Ht 77.5 in | Wt 216.0 lb

## 2024-01-08 DIAGNOSIS — R440 Auditory hallucinations: Secondary | ICD-10-CM | POA: Diagnosis present

## 2024-01-08 DIAGNOSIS — Z91148 Patient's other noncompliance with medication regimen for other reason: Secondary | ICD-10-CM | POA: Diagnosis not present

## 2024-01-08 DIAGNOSIS — F250 Schizoaffective disorder, bipolar type: Secondary | ICD-10-CM | POA: Diagnosis not present

## 2024-01-08 DIAGNOSIS — Z79899 Other long term (current) drug therapy: Secondary | ICD-10-CM | POA: Diagnosis not present

## 2024-01-08 DIAGNOSIS — R443 Hallucinations, unspecified: Secondary | ICD-10-CM

## 2024-01-08 DIAGNOSIS — F209 Schizophrenia, unspecified: Secondary | ICD-10-CM

## 2024-01-08 DIAGNOSIS — F1290 Cannabis use, unspecified, uncomplicated: Secondary | ICD-10-CM

## 2024-01-08 MED ORDER — ACETAMINOPHEN 325 MG PO TABS: 650.0000 mg | ORAL_TABLET | Freq: Four times a day (QID) | ORAL | Status: DC | PRN

## 2024-01-08 MED ORDER — OLANZAPINE 5 MG PO TABS: ORAL_TABLET | ORAL | 0 refills | Status: DC

## 2024-01-08 MED ORDER — OLANZAPINE 5 MG PO TBDP: 5.0000 mg | ORAL_TABLET | Freq: Three times a day (TID) | ORAL | Status: DC | PRN

## 2024-01-08 MED ORDER — MAGNESIUM HYDROXIDE 400 MG/5ML PO SUSP: 30.0000 mL | Freq: Every day | ORAL | Status: DC | PRN

## 2024-01-08 MED ORDER — DIVALPROEX SODIUM ER 500 MG PO TB24: 1000.0000 mg | ORAL_TABLET | Freq: Every evening | ORAL | 1 refills | Status: DC

## 2024-01-08 MED ORDER — OLANZAPINE 10 MG IM SOLR: 10.0000 mg | Freq: Three times a day (TID) | INTRAMUSCULAR | Status: DC | PRN

## 2024-01-08 MED ORDER — ALUM & MAG HYDROXIDE-SIMETH 200-200-20 MG/5ML PO SUSP: 30.0000 mL | ORAL | Status: DC | PRN

## 2024-01-08 MED ORDER — TRAZODONE HCL 50 MG PO TABS: 50.0000 mg | ORAL_TABLET | Freq: Every evening | ORAL | 2 refills | Status: DC | PRN

## 2024-01-08 MED ORDER — OLANZAPINE 10 MG IM SOLR: 5.0000 mg | Freq: Three times a day (TID) | INTRAMUSCULAR | Status: DC | PRN

## 2024-01-08 NOTE — BH Assessment (Addendum)
 Comprehensive Clinical Assessment (CCA) Note  01/08/2024 CHET GREENLEY [id number] Disposition: Patient was triaged by Lambert Mody, NT.  This clinician completed the CCA.  Patient was seen by Sindy Guadeloupe, NP for his MSE.  Roy recommended overnight continuous assessment at [facility].  Pt has a flat affect and fair eye contact.  He is quiet and relies on mother to provide some information.  Patient speaks quietly and in a halting cadence.  Pt does report poor appetite and sleep.    Pt was seen today (02/26) by Otila Back, PA at [facility] outpatient.     Chief Complaint:  Chief Complaint  Patient presents with   Schizophrenia   Hallucinations   Visit Diagnosis: Schizoaffective d/o bipolar type    CCA Screening, Triage and Referral (STR)  Patient Reported Information How did you hear about [place]? Self  What Is the Reason for Your Visit/Call Today? Pt presents to [facility] as a voluntary walk-in accompanied by his mother with complaint of "mental problems" and AH. Pt reports experiencing brain fog, mind racing, and hearing voices. Pt unable to describe what he is hearing. Pt reports diagnosis of schizoaffective disorder and is prescribed medication, but does not remember what he should be taking. Pt denies SI,HI,VH and substance/alcohol use.Pt denies SI or HI.  No visual hallucinations.  Does hear voices "in and out."  Pt does not have access to weapons.  Mother confirms no guns in the home, patient lives with her.  Pt has not been eating or sleeping well.  Mother said that she needs to keep prompting him to do things.  Pt is independent with his ADLs but mother said that he needs some prompting with this.   Pt feels tense, pressure.  Pt was seen today in outaptient by Brayton El, PA.  How Long Has This Been Causing You Problems? > than 6 months  What Do You Feel Would Help You the Most Today? Treatment for Depression or other mood problem; Medication(s)   Have You Recently Had Any Thoughts About  Hurting Yourself? No  Are You Planning to Commit Suicide/Harm Yourself At This time? No   Flowsheet Row ED from 01/08/2024 in [facility] Most recent reading at 01/08/2024 10:20 PM Office Visit from 01/08/2024 in [facility] Most recent reading at 01/08/2024  8:29 AM Office Visit from 12/18/2021 in [facility] Most recent reading at 12/18/2021  8:55 AM  C-SSRS RISK CATEGORY Moderate Risk Moderate Risk No Risk       Have you Recently Had Thoughts About Hurting Someone [name]? No  Are You Planning to Harm Someone at This Time? No  Explanation: Pt denies any SI or HI.   Have You Used Any Alcohol or Drugs in the Past 24 Hours? No  How Long Ago Did You Use Drugs or Alcohol? No data recorded What Did You Use and How Much? No data recorded  Do You Currently Have a Therapist/Psychiatrist? No (No but did see Otila Back, PA at [facility] today.)  Name of Therapist/Psychiatrist:    Have You Been Recently Discharged From Any Office Practice or Programs? No  Explanation of Discharge From Practice/Program: No data recorded    CCA Screening Triage Referral Assessment Type of Contact: Face-to-Face  Telemedicine Service Delivery:   Is this Initial or Reassessment?   Date Telepsych consult ordered in CHL:    Time Telepsych consult ordered in CHL:    Location of Assessment: [facility]  Provider Location: [facility]   Collateral Involvement: mother Darrold Junker [phone]   Does Patient Have a Court Appointed Legal Guardian? No  Legal Guardian Contact Information: Pt does not have a legal guardian.  Copy of Legal Guardianship Form: -- (Pt does not have a legal guardian.)  Legal Guardian Notified of Arrival: -- (Pt does not have a legal guardian.)  Legal Guardian Notified of Pending Discharge: -- (Pt does not have a legal guardian.)  If Minor and Not Living with  Parent(s), Who has Custody? Pt is an adult.  Is CPS involved or ever been involved? Never  Is APS involved or ever been involved? Never   Patient Determined To Be At Risk for Harm To Self or Others Based on Review of Patient Reported Information or Presenting Complaint? No  Method: No Plan  Availability of Means: No access or NA  Intent: Vague intent or NA  Notification Required: No need or identified person  Additional Information for Danger to Others Potential: Active psychosis  Additional Comments for Danger to Others Potential: Pt has no HI.  Are There Guns or Other Weapons in Your Home? No  Types of Guns/Weapons: No guns in the home.  Are These Weapons Safely Secured?                            No  Who Could Verify You Are Able To Have These Secured: Mother verified that there were no guns in the home.  Do You Have any Outstanding Charges, Pending Court Dates, Parole/Probation? None  Contacted To Inform of Risk of Harm To Self or Others: -- (Not necessary.)    Does Patient Present under Involuntary Commitment? No    [place] of Residence: [place]   Patient Currently Receiving the Following Services: Not Receiving Services   Determination of Need: Urgent (48 hours)   Options For Referral: [facility] (Per Sindy Guadeloupe, NP patient for continuous assessment.)     CCA Biopsychosocial Patient Reported Schizophrenia/Schizoaffective Diagnosis in Past: Yes   Strengths: Cooking, playing music.   Mental Health Symptoms Depression:  Difficulty Concentrating; Change in energy/activity; Fatigue; Increase/decrease in appetite; Irritability; Sleep (too much or little)   Duration of Depressive symptoms: Duration of Depressive Symptoms: Greater than two weeks   Mania:  Increased Energy; Irritability; Racing thoughts; Euphoria; Change in energy/activity   Anxiety:   Tension   Psychosis:  Hallucinations   Duration of Psychotic symptoms: Duration of Psychotic  Symptoms: Greater than six months   Trauma:  None   Obsessions:  None   Compulsions:  None   Inattention:  None   Hyperactivity/Impulsivity:  None   Oppositional/Defiant Behaviors:  None   Emotional Irregularity:  Chronic feelings of emptiness   Other Mood/Personality Symptoms:  Schizoaffective d/o bipolar type    Mental Status Exam Appearance and self-care  Stature:  Tall   Weight:  Average weight   Clothing:  Casual   Grooming:  Normal   Cosmetic use:  None   Posture/gait:  Normal   Motor activity:  Not Remarkable   Sensorium  Attention:  Normal; Distractible   Concentration:  Scattered   Orientation:  X5   Recall/memory:  Normal   Affect and Mood  Affect:  Flat   Mood:  Depressed   Relating  Eye contact:  Fleeting   Facial expression:  Depressed   Attitude toward examiner:  Cooperative; Guarded   Thought and Language  Speech flow:  Clear and Coherent   Thought content:  Appropriate to Mood and Circumstances   Preoccupation:  None   Hallucinations:  Auditory   Organization:  Loose   Company secretary of Knowledge:  Average   Intelligence:  Average   Abstraction:  Normal   Judgement:  Impaired   Reality Testing:  Adequate   Insight:  Lacking; Poor   Decision Making:  Confused   Social Functioning  Social Maturity:  Impulsive   Social Judgement:  Normal   Stress  Stressors:  Other (Comment) (Not taking medication.)   Coping Ability:  Overwhelmed   Skill Deficits:  Decision making; Interpersonal; Responsibility   Supports:  Family     Religion: Religion/Spirituality Are You A Religious Person?: Yes What is Your Religious Affiliation?: Unknown How Might This Affect Treatment?: No affect on treatemtn.  Leisure/Recreation: Leisure / Recreation Do You Have Hobbies?: Yes Leisure and Hobbies: walking and bike riding,   traveling, makes music, plays music, arts & crafts, sculptures, shopping, eating out,  dancing,  Exercise/Diet: Exercise/Diet Do You Exercise?: No Have You Gained or Lost A Significant Amount of Weight in the Past Six Months?: No Do You Follow a Special Diet?: No (Avoids pork.) Do You Have Any Trouble Sleeping?: Yes Explanation of Sleeping Difficulties: Pt will toss and turn.   CCA Employment/Education Employment/Work Situation: Employment / Work Situation Employment Situation: Unemployed Patient's Job has Been Impacted by Current Illness: No Has Patient ever Been in Equities trader?: No  Education: Education Is Patient Currently Attending School?: No Last Grade Completed: 12 Did You Product manager?: Yes What Type of College Degree Do you Have?: none "did some college" Did You Have An Individualized Education Program (IIEP): No Did You Have Any Difficulty At School?: No Patient's Education Has Been Impacted by Current Illness: No   CCA Family/Childhood History Family and Relationship History: Family history Marital status: Single Does patient have children?: No  Childhood History:  Childhood History By whom was/is the patient raised?: Other (Comment), Mother/father and step-parent, Grandparents Description of patient's current relationship with siblings: All are distant now that they have their own families. Did patient suffer any verbal/emotional/physical/sexual abuse as a child?: No Did patient suffer from severe childhood neglect?: No Has patient ever been sexually abused/assaulted/raped as an adolescent or adult?: No Was the patient ever a victim of a crime or a disaster?: No Witnessed domestic violence?: No Has patient been affected by domestic violence as an adult?: No       CCA Substance Use Alcohol/Drug Use: Alcohol / Drug Use Pain Medications: see MAR Prescriptions: Pt has been off meds for 4 months but got back on them today.  Is prescribed Olanzapine, Trazadone,  Divalproex Over the Counter: Ibuprophen History of alcohol / drug use?:  Yes Withdrawal Symptoms: None Substance #1 Name of Substance 1: Marijuana 1 - Age of First Use: Teens 1 - Amount (size/oz): "maybe a blunt a day" 1 - Frequency: Daily 1 - Duration: off and on 1 - Last Use / Amount: 02/26 1 - Method of Aquiring: purchase 1- Route of Use: Smoking                       ASAM's:  Six Dimensions of Multidimensional Assessment  Dimension 1:  Acute Intoxication and/or Withdrawal Potential:      Dimension 2:  Biomedical Conditions and Complications:      Dimension 3:  Emotional, Behavioral, or Cognitive Conditions and Complications:     Dimension  4:  Readiness to Change:     Dimension 5:  Relapse, Continued use, or Continued Problem Potential:     Dimension 6:  Recovery/Living Environment:     ASAM Severity Score:    ASAM Recommended Level of Treatment:     Substance use Disorder (SUD)    Recommendations for Services/Supports/Treatments:    Disposition Recommendation per psychiatric provider: We recommend transfer to [facility]. Pt currently voluntary at [facility]     DSM5 Diagnoses: Patient Active Problem List   Diagnosis Date Noted   Schizoaffective disorder, bipolar type (HCC) 11/02/2021   Cannabis abuse 11/02/2021   Acute psychosis (HCC) 12/05/2017   Routine general medical examination at a health care facility 11/21/2012   Allergic rhinitis due to other allergen 06/01/2009     Referrals to Alternative Service(s): Referred to Alternative Service(s):   Place:   Date:   Time:    Referred to Alternative Service(s):   Place:   Date:   Time:    Referred to Alternative Service(s):   Place:   Date:   Time:    Referred to Alternative Service(s):   Place:   Date:   Time:     Wandra Mannan

## 2024-01-08 NOTE — Telephone Encounter (Signed)
 Patients mother called stating that her son did not get a refill on his Depakote and is asking that it be called in to his pharmacy.

## 2024-01-08 NOTE — Progress Notes (Signed)
 Psychiatric Initial Adult Assessment   Patient Identification: Shawn Nicholson MRN:  [id number] Date of Evaluation:  01/08/2024 Referral Source: Walk-in Chief Complaint:   Chief Complaint  Patient presents with   Establish Care   Medication Management   Visit Diagnosis:    ICD-10-CM   1. Schizoaffective disorder, bipolar type (HCC)  F25.0 traZODone (DESYREL) 50 MG tablet    OLANZapine (ZYPREXA) 5 MG tablet    divalproex (DEPAKOTE ER) 500 MG 24 hr tablet     History of Present Illness:  ***  Shawn Nicholson  Associated Signs/Symptoms: Depression Symptoms:  depressed mood, anhedonia, insomnia, psychomotor agitation, psychomotor retardation, fatigue, feelings of worthlessness/guilt, difficulty concentrating, hopelessness, impaired memory, anxiety, panic attacks, loss of energy/fatigue, disturbed sleep, weight loss, weight gain, decreased labido, increased appetite, decreased appetite, (Hypo) Manic Symptoms:  Delusions, Distractibility, Elevated Mood, Flight of Ideas, Grandiosity, Hallucinations, Impulsivity, Irritable Mood, Labiality of Mood, Anxiety Symptoms:  Excessive Worry, Panic Symptoms, Psychotic Symptoms:  Delusions, Hallucinations: Auditory Visual Ideas of Reference, Paranoia, PTSD Symptoms: Had a traumatic exposure:  Patient reports that there was incident that conjured different emotions and thoughts. Patient unable to go into too much detail of any significant events. Had a traumatic exposure in the last month:  N/A Re-experiencing:  Flashbacks Intrusive Thoughts Nightmares Hypervigilance:  Patient reports "sometimes" Hyperarousal:  Difficulty Concentrating Emotional Numbness/Detachment Irritability/Anger Sleep Avoidance:  Foreshortened Future  Past Psychiatric History:  Patient has a past psychiatric history significant for schizoaffective disorder (bipolar type)  Patient endorses a past history of hospitalization due to mental  health.  He reports that he has not been hospitalized for a while.  Per chart review, patient was last hospitalized at [facility].  Previous Psychotropic Medications: {YES/NO:21197}  Substance Abuse History in the last 12 months:  {yes no:314532}  Consequences of Substance Abuse: {BHH CONSEQUENCES OF SUBSTANCE ABUSE:22880}  Past Medical History:  Past Medical History:  Diagnosis Date   Allergy    Anxiety    no meds   Boxer's fracture    RLF   Depression     Past Surgical History:  Procedure Laterality Date   CLOSED REDUCTION METACARPAL WITH PERCUTANEOUS PINNING  10/23/2012   Procedure: CLOSED REDUCTION METACARPAL WITH PERCUTANEOUS PINNING;  Surgeon: Tami Ribas, MD;  Location: [facility];  Service: Orthopedics;  Laterality: Right;   OPEN REDUCTION INTERNAL FIXATION (ORIF) METACARPAL  10/23/2012   Procedure: OPEN REDUCTION INTERNAL FIXATION (ORIF) METACARPAL;  Surgeon: Tami Ribas, MD;  Location: [facility];  Service: Orthopedics;;  RIGHT SMALL AND RING FINGER ORIF VERSUS CLOSED REDUCTION PERCUTANEOUS PINNING    Family Psychiatric History: ***  Family History:  Family History  Problem Relation Age of Onset   Hypertension Father    Arthritis Mother    Arthritis Other    Hypertension Other    Cancer Neg Hx    Alcohol abuse Neg Hx    Depression Neg Hx    Diabetes Neg Hx    Drug abuse Neg Hx    Early death Neg Hx    Heart disease Neg Hx    Hyperlipidemia Neg Hx    Kidney disease Neg Hx    Stroke Neg Hx     Social History:   Social History   Socioeconomic History   Marital status: Single    Spouse name: Not on file   Number of children: Not on file   Years of education: Not on file   Highest education level: Not  on file  Occupational History   Not on file  Tobacco Use   Smoking status: Light Smoker    Types: Cigarettes   Smokeless tobacco: Never  Vaping Use   Vaping status: Never Used  Substance and  Sexual Activity   Alcohol use: Yes    Alcohol/week: 3.0 standard drinks of alcohol    Types: 3 Cans of beer per week    Comment: social   Drug use: Yes    Types: Marijuana   Sexual activity: Not Currently  Other Topics Concern   Not on file  Social History Narrative   Not on file   Social Drivers of Health   Financial Resource Strain: Not on file  Food Insecurity: Not on file  Transportation Needs: Not on file  Physical Activity: Not on file  Stress: Not on file  Social Connections: Not on file    Additional Social History: ***  Allergies:   Allergies  Allergen Reactions   Other Other (See Comments)    Reaction to pecans showed up on allergy test   Penicillins Rash    Metabolic Disorder Labs: Lab Results  Component Value Date   HGBA1C 4.9 10/30/2021   MPG 93.93 10/30/2021   MPG 96.8 11/13/2018   No results found for: "PROLACTIN" Lab Results  Component Value Date   CHOL 131 10/30/2021   TRIG 36 10/30/2021   HDL 37 (L) 10/30/2021   CHOLHDL 3.5 10/30/2021   VLDL 7 10/30/2021   LDLCALC 87 10/30/2021   LDLCALC 112 (H) 11/13/2018   Lab Results  Component Value Date   TSH 0.413 10/30/2021    Therapeutic Level Labs: No results found for: "LITHIUM" No results found for: "CBMZ" Lab Results  Component Value Date   VALPROATE 80 11/07/2021    Current Medications: Current Outpatient Medications  Medication Sig Dispense Refill   divalproex (DEPAKOTE ER) 500 MG 24 hr tablet Take 2 tablets (1,000 mg total) by mouth at bedtime. 60 tablet 1   hydrOXYzine (ATARAX) 25 MG tablet Take 1 tablet (25 mg total) by mouth 3 (three) times daily as needed for anxiety. 75 tablet 2   OLANZapine (ZYPREXA) 5 MG tablet Take 1 tablet (5 mg total) by mouth at bedtime for 6 days, THEN 2 tablets (10 mg total) at bedtime. 60 tablet 0   traZODone (DESYREL) 50 MG tablet Take 1 tablet (50 mg total) by mouth at bedtime as needed for sleep. 30 tablet 2   No current facility-administered  medications for this visit.    Musculoskeletal: Strength & Muscle Tone: {desc; muscle tone:32375} Gait & Station: {PE GAIT ED WGNF:62130} Patient leans: {Patient Leans:21022755}  Psychiatric Specialty Exam: Review of Systems  Blood pressure 137/78, pulse 74, height 6' 5.5" (1.969 m), weight 216 lb (98 kg), SpO2 100%.Body mass index is 25.28 kg/m.  General Appearance: {Appearance:22683}  Eye Contact:  {BHH EYE CONTACT:22684}  Speech:  {Speech:22685}  Volume:  {Volume (PAA):22686}  Mood:  {BHH MOOD:22306}  Affect:  {Affect (PAA):22687}  Thought Process:  {Thought Process (PAA):22688}  Orientation:  {BHH ORIENTATION (PAA):22689}  Thought Content:  {Thought Content:22690}  Suicidal Thoughts:  {ST/HT (PAA):22692}  Homicidal Thoughts:  {ST/HT (PAA):22692}  Memory:  {BHH MEMORY:22881}  Judgement:  {Judgement (PAA):22694}  Insight:  {Insight (PAA):22695}  Psychomotor Activity:  {Psychomotor (PAA):22696}  Concentration:  {Concentration:21399}  Recall:  {BHH GOOD/FAIR/POOR:22877}  Fund of Knowledge:{BHH GOOD/FAIR/POOR:22877}  Language: {BHH GOOD/FAIR/POOR:22877}  Akathisia:  {BHH YES OR NO:22294}  Handed:  {Handed:22697}  AIMS (if indicated):  {Desc; done/not:10129}  Assets:  {Assets (PAA):22698}  ADL's:  {BHH ZOX'W:96045}  Cognition: {chl bhh cognition:304700322}  Sleep:  {BHH GOOD/FAIR/POOR:22877}   Screenings: AIMS    Flowsheet Row [facility]) [facility] Ad[facility] (Discharged) from [facility]  AIMS Total Score 0 0      AUDIT    Flowsheet Row [facility]) [facility] Admission (Discharged) from [facility]  Alcohol Use Disorder Identification Test Final Score (AUDIT) 3 0      GAD-7    Flowsheet Row Office Visit from  01/08/2024 in [facility] Counselor from 01/07/2024 in [facility]  Total GAD-7 Score 17 16      PHQ2-9    Flowsheet Row Office Visit from 01/08/2024 in [facility] Counselor from 01/07/2024 in [facility] Office Visit from 12/18/2021 in [facility] Office Visit from 05/25/2015 in [facility]  PHQ-2 Total Score 3 3 0 0  PHQ-9 Total Score 17 19 -- --      Flowsheet Row Office Visit from 01/08/2024 in [facility] Office Visit from 12/18/2021 in [facility] Admission (Discharged[facility]  C-SSRS RISK CATEGORY Moderate Risk No Risk No Risk       Assessment and Plan: ***  Collaboration of Care: {BH OP Collaboration of Care:21014065}  Patient/Guardian was advised Release of Information must be obtained prior to any record release in order to collaborate their care with an outside provider. Patient/Guardian was advised if they have not already done so to contact the registration department to sign all necessary forms in order for [place] to release information regarding their care.   Consent: Patient/Guardian gives verbal consent for treatment and assignment of benefits for services provided during this visit. Patient/Guardian expressed understanding and agreed to proceed.   Meta Hatchet, PA 2/26/20256:23 PM

## 2024-01-08 NOTE — Telephone Encounter (Signed)
 Message acknowledged and reviewed.

## 2024-01-08 NOTE — Progress Notes (Signed)
   01/08/24 2112  [facility] Triage Screening (Walk-ins at [facility] only)  How Did You Hear About [place]? Self  What Is the Reason for Your Visit/Call Today? Pt presents to [facility] as a voluntary walk-in accompanied by his mother with complaint of "mental problems" and AH. Pt reports experiencing brain fog, mind racing, and hearing voices. Pt unable to describe what he is hearing. Pt reports diagnosis of schizoaffective disorder and is prescribed medication, but does not remember what he should be taking. Pt denies SI,HI,VH and substance/alcohol use.  How Long Has This Been Causing You Problems? > than 6 months  Have You Recently Had Any Thoughts About Hurting Yourself? No  Are You Planning to Commit Suicide/Harm Yourself At This time? No  Are You Planning To Harm Someone At This Time? No  Physical Abuse Denies  Verbal Abuse Denies  Sexual Abuse Denies  Exploitation of patient/patient's resources Denies  Self-Neglect Denies  Are you currently experiencing any auditory, visual or other hallucinations? No  Have You Used Any Alcohol or Drugs in the Past 24 Hours? No  Do you have any current medical co-morbidities that require immediate attention? No  Clinician description of patient physical appearance/behavior: appears confused, slow to respond,  What Do You Feel Would Help You the Most Today? Treatment for Depression or other mood problem;Medication(s)  If access to [facility] was not available, would you have sought care in the Emergency Department? No  Determination of Need Urgent (48 hours)  Options For Referral Other: Comment;Medication Management;Outpatient Therapy;BH Urgent Care  Determination of Need filed? Yes

## 2024-01-08 NOTE — ED Notes (Signed)
 Pt sleeping@this  time breathing even and unlabored will continue to monitor for safety

## 2024-01-08 NOTE — ED Provider Notes (Signed)
 [facility] Continuous Assessment Admission H&P  Date: 01/08/24 Patient Name: Shawn Nicholson MRN: [id number] Chief Complaint: behavioral changes   Diagnoses:  Final diagnoses:  Schizophrenia, unspecified type (HCC)  History of medication noncompliance  Hallucination  Marijuana use    HPI: Shawn Nicholson,  43 y/o male with a history of schizoaffective disorder, medication noncompliance.  Presented to [facility] voluntarily accompanied by his mother.  Per the patient's mother patient has not been acting his normal self, patient has been hospitalized 4 times for psychiatric problems.  According to the patient's mother patient has been noncompliant with his medication regiment stating that he does not need to take medicines.  However tonight the patient called and said he wanted to come in.  A review of patient history show the patient was seen today in the outpatient clinic and prescribed medications.   Face-to-face evaluation of patient, patient is alert and oriented x 4, speech is clear however very soft spoken.  Patient appears anxious.  Affect is flat congruent with mood.  Patient denies SI, HI, reports he hears voices and sees shadows.  But could not specify exactly what they were.  Patient reports he smoked marijuana the last time was today.  Patient denies alcohol use.  Patient does appear to be influenced by internal stimuli.  Writer discussed with both patient and to his mother the need for admissions.  Both in agreement with plan.  Total Time spent with patient: 30 minutes  Musculoskeletal  Strength & Muscle Tone: within normal limits Gait & Station: normal Patient leans: N/A  Psychiatric Specialty Exam  Presentation General Appearance:  Casual  Eye Contact: Good  Speech: Clear and Coherent  Speech Volume: Normal  Handedness: Right   Mood and Affect  Mood: Euthymic  Affect: Congruent   Thought Process  Thought Processes: Linear  Descriptions of  Associations:Intact  Orientation:Full (Time, Place and Person)  Thought Content:WDL  Diagnosis of Schizophrenia or Schizoaffective disorder in past: Yes  Duration of Psychotic Symptoms: Greater than six months  Hallucinations:Hallucinations: Auditory Description of Auditory Hallucinations: voices  Ideas of Reference:None  Suicidal Thoughts:Suicidal Thoughts: No  Homicidal Thoughts:Homicidal Thoughts: No   Sensorium  Memory: Immediate Fair  Judgment: Poor  Insight: Fair   Chartered certified accountant: Fair  Attention Span: Fair  Recall: Fair  Fund of Knowledge: Fair  Language: Fair   Psychomotor Activity  Psychomotor Activity: Psychomotor Activity: Normal   Assets  Assets: Desire for Improvement; Resilience   Sleep  Sleep: Sleep: Fair Number of Hours of Sleep: 6   Nutritional Assessment (For OBS and FBC admissions only) Has the patient had a weight loss or gain of 10 pounds or more in the last 3 months?: No Has the patient had a decrease in food intake/or appetite?: No Does the patient have dental problems?: No Does the patient have eating habits or behaviors that may be indicators of an eating disorder including binging or inducing vomiting?: No Has the patient recently lost weight without trying?: 0 Has the patient been eating poorly because of a decreased appetite?: 0 Malnutrition Screening Tool Score: 0    Physical Exam HENT:     Head: Normocephalic.     Nose: Nose normal.  Eyes:     Pupils: Pupils are equal, round, and reactive to light.  Cardiovascular:     Rate and Rhythm: Normal rate.  Pulmonary:     Effort: Pulmonary effort is normal.  Musculoskeletal:        General: Normal range  of motion.     Cervical back: Normal range of motion.  Neurological:     General: No focal deficit present.     Mental Status: He is alert.  Psychiatric:        Mood and Affect: Mood normal.        Behavior: Behavior normal.         Thought Content: Thought content normal.        Judgment: Judgment normal.    Review of Systems  Constitutional: Negative.   HENT: Negative.    Eyes: Negative.   Respiratory: Negative.    Cardiovascular: Negative.   Gastrointestinal: Negative.   Genitourinary: Negative.   Musculoskeletal: Negative.   Skin: Negative.   Neurological: Negative.   Psychiatric/Behavioral:  Positive for hallucinations. The patient is nervous/anxious.     Blood pressure 128/76, pulse 99, temperature 98.5 F (36.9 C), temperature source Oral, resp. rate 20, SpO2 100%. There is no height or weight on file to calculate BMI.  Past Psychiatric History: Schizoaffective disorder, bipolar disorder, medication noncompliant  Is the patient at risk to self? No  Has the patient been a risk to self in the past 6 months? No .    Has the patient been a risk to self within the distant past? No   Is the patient a risk to others? No   Has the patient been a risk to others in the past 6 months? No   Has the patient been a risk to others within the distant past? No   Past Medical History: See chart  Family History: Unknown  Social History: Marijuana use  Last Labs:  No visits with results within 6 Month(s) from this visit.  Latest known visit with results is:  Admission on 11/01/2021, Discharged on 11/09/2021  Component Date Value Ref Range Status   Valproic Acid Lvl 11/07/2021 80  50.0 - 100.0 ug/mL Final   Performed at [facility], [address], [place], [place] [address]   WBC 11/07/2021 8.2  4.0 - 10.5 K/uL Final   RBC 11/07/2021 4.77  4.22 - 5.81 MIL/uL Final   Hemoglobin 11/07/2021 14.0  13.0 - 17.0 g/dL Final   HCT 60/45/4098 39.2  39.0 - 52.0 % Final   MCV 11/07/2021 82.2  80.0 - 100.0 fL Final   MCH 11/07/2021 29.4  26.0 - 34.0 pg Final   MCHC 11/07/2021 35.7  30.0 - 36.0 g/dL Final   RDW 11/91/4782 13.7  11.5 - 15.5 % Final   Platelets 11/07/2021 243  150 - 400 K/uL Final    nRBC 11/07/2021 0.0  0.0 - 0.2 % Final   Neutrophils Relative % 11/07/2021 46  % Final   Neutro Abs 11/07/2021 3.8  1.7 - 7.7 K/uL Final   Lymphocytes Relative 11/07/2021 36  % Final   Lymphs Abs 11/07/2021 2.9  0.7 - 4.0 K/uL Final   Monocytes Relative 11/07/2021 10  % Final   Monocytes Absolute 11/07/2021 0.8  0.1 - 1.0 K/uL Final   Eosinophils Relative 11/07/2021 7  % Final   Eosinophils Absolute 11/07/2021 0.6 (H)  0.0 - 0.5 K/uL Final   Basophils Relative 11/07/2021 1  % Final   Basophils Absolute 11/07/2021 0.1  0.0 - 0.1 K/uL Final   Immature Granulocytes 11/07/2021 0  % Final   Abs Immature Granulocytes 11/07/2021 0.02  0.00 - 0.07 K/uL Final   Performed at [facility], [address], [place], [place] [address]   Total Protein  11/07/2021 7.5  6.5 - 8.1 g/dL Final   Albumin 52/84/1324 4.0  3.5 - 5.0 g/dL Final   AST 40/08/2724 19  15 - 41 U/L Final   ALT 11/07/2021 14  0 - 44 U/L Final   Alkaline Phosphatase 11/07/2021 63  38 - 126 U/L Final   Total Bilirubin 11/07/2021 0.6  0.3 - 1.2 mg/dL Final   Bilirubin, Direct 11/07/2021 <0.1  0.0 - 0.2 mg/dL Final   Indirect Bilirubin 11/07/2021 NOT CALCULATED  0.3 - 0.9 mg/dL Final   Performed at [facility], [address] [place], [place] [address]    Allergies: Other and Penicillins  Medications:  Facility Ordered Medications  Medication   acetaminophen (TYLENOL) tablet 650 mg   alum & mag hydroxide-simeth (MAALOX/MYLANTA) 200-200-20 MG/5ML suspension 30 mL   magnesium hydroxide (MILK OF MAGNESIA) suspension 30 mL   OLANZapine zydis (ZYPREXA) disintegrating tablet 5 mg   OLANZapine (ZYPREXA) injection 5 mg   OLANZapine (ZYPREXA) injection 10 mg   PTA Medications  Medication Sig   hydrOXYzine (ATARAX) 25 MG tablet Take 1 tablet (25 mg total) by mouth 3 (three) times daily as needed for anxiety.   traZODone (DESYREL) 50 MG tablet Take 1 tablet (50 mg total) by mouth at bedtime as needed  for sleep.   OLANZapine (ZYPREXA) 5 MG tablet Take 1 tablet (5 mg total) by mouth at bedtime for 6 days, THEN 2 tablets (10 mg total) at bedtime.   divalproex (DEPAKOTE ER) 500 MG 24 hr tablet Take 2 tablets (1,000 mg total) by mouth at bedtime.      Medical Decision Making  Observation    Recommendations  Based on my evaluation the patient does not appear to have an emergency medical condition.  Sindy Guadeloupe, NP 01/08/24  9:44 PM   Isa Rankin, MD 01/14/24 Windy Fast

## 2024-01-09 ENCOUNTER — Inpatient Hospital Stay
Admission: AD | Admit: 2024-01-09 | Discharge: 2024-01-15 | DRG: 885 | Disposition: A | Discharge status: Home / Self Care | Payer: No Typology Code available for payment source | Source: Intra-hospital | Attending: Psychiatry | Admitting: Psychiatry

## 2024-01-09 ENCOUNTER — Other Ambulatory Visit: Payer: Self-pay

## 2024-01-09 ENCOUNTER — Encounter: Payer: Self-pay | Admitting: Behavioral Health

## 2024-01-09 DIAGNOSIS — F32A Depression, unspecified: Secondary | ICD-10-CM | POA: Diagnosis present

## 2024-01-09 DIAGNOSIS — F250 Schizoaffective disorder, bipolar type: Principal | ICD-10-CM | POA: Diagnosis present

## 2024-01-09 DIAGNOSIS — F23 Brief psychotic disorder: Principal | ICD-10-CM | POA: Diagnosis present

## 2024-01-09 DIAGNOSIS — F419 Anxiety disorder, unspecified: Secondary | ICD-10-CM | POA: Diagnosis present

## 2024-01-09 DIAGNOSIS — F1210 Cannabis abuse, uncomplicated: Secondary | ICD-10-CM | POA: Diagnosis present

## 2024-01-09 DIAGNOSIS — Z8249 Family history of ischemic heart disease and other diseases of the circulatory system: Secondary | ICD-10-CM

## 2024-01-09 DIAGNOSIS — Z8261 Family history of arthritis: Secondary | ICD-10-CM | POA: Diagnosis not present

## 2024-01-09 DIAGNOSIS — Z79899 Other long term (current) drug therapy: Secondary | ICD-10-CM | POA: Diagnosis not present

## 2024-01-09 DIAGNOSIS — Z9151 Personal history of suicidal behavior: Secondary | ICD-10-CM | POA: Diagnosis not present

## 2024-01-09 DIAGNOSIS — F17200 Nicotine dependence, unspecified, uncomplicated: Secondary | ICD-10-CM | POA: Diagnosis present

## 2024-01-09 DIAGNOSIS — G4700 Insomnia, unspecified: Secondary | ICD-10-CM | POA: Diagnosis present

## 2024-01-09 DIAGNOSIS — F259 Schizoaffective disorder, unspecified: Secondary | ICD-10-CM | POA: Insufficient documentation

## 2024-01-09 DIAGNOSIS — Z91148 Patient's other noncompliance with medication regimen for other reason: Secondary | ICD-10-CM | POA: Diagnosis not present

## 2024-01-09 DIAGNOSIS — Z716 Tobacco abuse counseling: Secondary | ICD-10-CM

## 2024-01-09 HISTORY — DX: Other psychoactive substance abuse, uncomplicated: F19.10

## 2024-01-09 LAB — CBC WITH DIFFERENTIAL/PLATELET
Abs Immature Granulocytes: 0.03 10*3/uL (ref 0.00–0.07)
Basophils Absolute: 0 10*3/uL (ref 0.0–0.1)
Basophils Relative: 0 %
Eosinophils Absolute: 0 10*3/uL (ref 0.0–0.5)
Eosinophils Relative: 0 %
HCT: 39.1 % (ref 39.0–52.0)
Hemoglobin: 14.4 g/dL (ref 13.0–17.0)
Immature Granulocytes: 0 %
Lymphocytes Relative: 26 %
Lymphs Abs: 2.7 10*3/uL (ref 0.7–4.0)
MCH: 28.3 pg (ref 26.0–34.0)
MCHC: 36.8 g/dL — ABNORMAL HIGH (ref 30.0–36.0)
MCV: 76.8 fL — ABNORMAL LOW (ref 80.0–100.0)
Monocytes Absolute: 0.8 10*3/uL (ref 0.1–1.0)
Monocytes Relative: 7 %
Neutro Abs: 6.8 10*3/uL (ref 1.7–7.7)
Neutrophils Relative %: 67 %
Platelets: 338 10*3/uL (ref 150–400)
RBC: 5.09 MIL/uL (ref 4.22–5.81)
RDW: 13.7 % (ref 11.5–15.5)
WBC: 10.4 10*3/uL (ref 4.0–10.5)
nRBC: 0 % (ref 0.0–0.2)

## 2024-01-09 LAB — COMPREHENSIVE METABOLIC PANEL
ALT: 16 U/L (ref 0–44)
AST: 19 U/L (ref 15–41)
Albumin: 4 g/dL (ref 3.5–5.0)
Alkaline Phosphatase: 106 U/L (ref 38–126)
Anion gap: 13 (ref 5–15)
BUN: 8 mg/dL (ref 6–20)
CO2: 24 mmol/L (ref 22–32)
Calcium: 9.8 mg/dL (ref 8.9–10.3)
Chloride: 99 mmol/L (ref 98–111)
Creatinine, Ser: 0.86 mg/dL (ref 0.61–1.24)
GFR, Estimated: >60 mL/min (ref >60)
Glucose, Bld: 107 mg/dL — ABNORMAL HIGH (ref 70–99)
Potassium: 3.8 mmol/L (ref 3.5–5.1)
Sodium: 136 mmol/L (ref 135–145)
Total Bilirubin: 0.6 mg/dL (ref 0.0–1.2)
Total Protein: 6.8 g/dL (ref 6.5–8.1)

## 2024-01-09 LAB — LIPID PANEL
Cholesterol: 127 mg/dL (ref 0–200)
HDL: 29 mg/dL — ABNORMAL LOW (ref >40)
LDL Cholesterol: 78 mg/dL (ref 0–99)
Total CHOL/HDL Ratio: 4.4 ratio
Triglycerides: 102 mg/dL (ref <150)
VLDL: 20 mg/dL (ref 0–40)

## 2024-01-09 LAB — POCT URINE DRUG SCREEN - MANUAL ENTRY (I-SCREEN)
POC Amphetamine UR: NOT DETECTED
POC Buprenorphine (BUP): NOT DETECTED
POC Cocaine UR: NOT DETECTED
POC Marijuana UR: POSITIVE — AB
POC Methadone UR: NOT DETECTED
POC Methamphetamine UR: NOT DETECTED
POC Morphine: NOT DETECTED
POC Oxazepam (BZO): NOT DETECTED
POC Oxycodone UR: NOT DETECTED
POC Secobarbital (BAR): NOT DETECTED

## 2024-01-09 LAB — HEMOGLOBIN A1C
Hgb A1c MFr Bld: 5.4 % (ref 4.8–5.6)
Mean Plasma Glucose: 108.28 mg/dL

## 2024-01-09 LAB — TSH: TSH: 0.862 u[IU]/mL (ref 0.350–4.500)

## 2024-01-09 LAB — ETHANOL: Alcohol, Ethyl (B): <10 mg/dL (ref <10)

## 2024-01-09 MED ORDER — DIVALPROEX SODIUM ER 500 MG PO TB24
1000.0000 mg | ORAL_TABLET | Freq: Every day | ORAL | Status: DC
  Administered 2024-01-09 – 2024-01-13 (×5): 1000 mg via ORAL
  Filled 2024-01-09 (×5): qty 2

## 2024-01-09 MED ORDER — HALOPERIDOL LACTATE 5 MG/ML IJ SOLN: 5.0000 mg | Freq: Three times a day (TID) | INTRAMUSCULAR | Status: DC | PRN

## 2024-01-09 MED ORDER — DIPHENHYDRAMINE HCL 25 MG PO CAPS: 50.0000 mg | ORAL_CAPSULE | Freq: Three times a day (TID) | ORAL | Status: DC | PRN

## 2024-01-09 MED ORDER — DIPHENHYDRAMINE HCL 50 MG/ML IJ SOLN: 50.0000 mg | Freq: Three times a day (TID) | INTRAMUSCULAR | Status: DC | PRN

## 2024-01-09 MED ORDER — LORAZEPAM 2 MG/ML IJ SOLN: 2.0000 mg | Freq: Three times a day (TID) | INTRAMUSCULAR | Status: DC | PRN

## 2024-01-09 MED ORDER — OLANZAPINE 5 MG PO TABS
5.0000 mg | ORAL_TABLET | Freq: Every day | ORAL | Status: DC
Start: 2024-01-09 — End: 2024-01-10
  Administered 2024-01-09: 5 mg via ORAL
  Filled 2024-01-09: qty 1

## 2024-01-09 MED ORDER — ACETAMINOPHEN 325 MG PO TABS: 650.0000 mg | ORAL_TABLET | Freq: Four times a day (QID) | ORAL | Status: DC | PRN

## 2024-01-09 MED ORDER — OLANZAPINE 5 MG PO TABS: 5.0000 mg | ORAL_TABLET | Freq: Every day | ORAL | Status: DC

## 2024-01-09 MED ORDER — MAGNESIUM HYDROXIDE 400 MG/5ML PO SUSP: 30.0000 mL | Freq: Every day | ORAL | Status: DC | PRN

## 2024-01-09 MED ORDER — HALOPERIDOL 5 MG PO TABS: 5.0000 mg | ORAL_TABLET | Freq: Three times a day (TID) | ORAL | Status: DC | PRN

## 2024-01-09 MED ORDER — OLANZAPINE 5 MG PO TABS
5.0000 mg | ORAL_TABLET | Freq: Once | ORAL | Status: AC
  Administered 2024-01-09: 5 mg via ORAL
  Filled 2024-01-09: qty 1

## 2024-01-09 MED ORDER — TRAZODONE HCL 50 MG PO TABS
50.0000 mg | ORAL_TABLET | Freq: Every evening | ORAL | Status: DC | PRN
Start: 2024-01-09 — End: 2024-01-15
  Administered 2024-01-11 – 2024-01-14 (×4): 50 mg via ORAL
  Filled 2024-01-09 (×4): qty 1

## 2024-01-09 MED ORDER — HALOPERIDOL LACTATE 5 MG/ML IJ SOLN: 10.0000 mg | Freq: Three times a day (TID) | INTRAMUSCULAR | Status: DC | PRN

## 2024-01-09 MED ORDER — HYDROXYZINE HCL 25 MG PO TABS
25.0000 mg | ORAL_TABLET | Freq: Three times a day (TID) | ORAL | Status: DC | PRN
  Administered 2024-01-10: 25 mg via ORAL
  Filled 2024-01-09: qty 1

## 2024-01-09 MED ORDER — DIVALPROEX SODIUM ER 500 MG PO TB24
1000.0000 mg | ORAL_TABLET | Freq: Every day | ORAL | Status: DC
Start: 2024-01-09 — End: 2024-01-09

## 2024-01-09 MED ORDER — ALUM & MAG HYDROXIDE-SIMETH 200-200-20 MG/5ML PO SUSP: 30.0000 mL | ORAL | Status: DC | PRN

## 2024-01-09 NOTE — Group Note (Addendum)
 LCSW Group Therapy Note  Group Date: 01/09/2024 Start Time: 1330 End Time: 1430   Type of Therapy and Topic:  Group Therapy: Anger Cues and Responses  Participation Level:  Did Not Attend   Description of Group:   In this group, patients learned how to recognize the physical, cognitive, emotional, and behavioral responses they have to anger-provoking situations.  They identified a recent time they became angry and how they reacted.  They analyzed how their reaction was possibly beneficial and how it was possibly unhelpful.  The group discussed a variety of healthier coping skills that could help with such a situation in the future.  Focus was placed on how helpful it is to recognize the underlying emotions to our anger, because working on those can lead to a more permanent solution as well as our ability to focus on the important rather than the urgent.  Therapeutic Goals: Patients will remember their last incident of anger and how they felt emotionally and physically, what their thoughts were at the time, and how they behaved. Patients will identify how their behavior at that time worked for them, as well as how it worked against them. Patients will explore possible new behaviors to use in future anger situations. Patients will learn that anger itself is normal and cannot be eliminated, and that healthier reactions can assist with resolving conflict rather than worsening situations.  Summary of Patient Progress:   Patient did not attend group.  Therapeutic Modalities:   Cognitive Behavioral Therapy    Harden Mo, LCSW 01/09/2024  3:14 PM

## 2024-01-09 NOTE — ED Notes (Signed)
 Patient arrived to the unit calm and cooperative. During my assessment, the patient seems preoccupied and reports hearing voices without commands and reports seeing "elements of entities". He denies anxiety, depression, and SIHI. Staff will continue to monitor and for changes in condition.

## 2024-01-09 NOTE — Progress Notes (Signed)
 Patient is a voluntary admission to [facility] for SAD - noncompliant with meds.  Mother took patient to [facility] for the medication noncompliance after his boss called her to say he was not acting right.  Patient arrived from [facility] calm, cooperative. Denies SI, HI, AVH, anxiety and depression currently but has history of AVH.  Patient states his plan is to get better so he can go home. Will continue to monitor patient.

## 2024-01-09 NOTE — ED Notes (Signed)
 Patient is resting with eyes closed without any distress noted. Staff will continue to monitor safety and for changes in condition.

## 2024-01-09 NOTE — [facility]HH Suicide Risk Assessment (Signed)
 Suicide Risk Assessment  Admission Assessment    [facility] Admission Suicide Risk Assessment   Nursing information obtained from:  Patient Demographic factors:  Male Current Mental Status:  NA Loss Factors:  NA Historical Factors:  NA Risk Reduction Factors:  Living with another person, especially a relative  Total Time spent with patient: 1 hour Principal Problem: Acute psychosis (HCC) Diagnosis:  Principal Problem:   Acute psychosis (HCC) Active Problems:   Cannabis abuse   Non compliance w medication regimen  Subjective Data:  43 yr old never married [place] American male with reported hx of schizoaffective disorder, bipolar type, cannabis abuse, non-adherence with med regimen presented to [facility] voluntarily accompanied by his mother on 01/08/2024 with chief complaints of abnormal behaviors, auditory and visual hallucinations, confusion, difficulties sleeping, concentrating, irritability. Reportedly was off his meds for about 4 months per chart review. UDS positive for THC.   Pt is seen for evaluation, reports that he was brought into the hospital because " I had a stressful moment... I had a lot on my mind and my stomach was hurting".  He would not elaborate further on what is causing him stress.  States that he has been hearing voices daily for the last 3 weeks, also having intermittent visual hallucinations of " entities... Like shadows" also for the last 3 weeks.  States that this is one voice that he hears, at first he is unable to tell the dander, then goes on to state that it is male.  States he does not recognize the voice.  Last heard yesterday.  States that he has also been having some difficulty sleeping at night, that he is only getting about 5 to 6 hours at night which is unusual for him as he usually gets 8 hours.  Reports good appetite.  States that he is also having " obsessive thoughts", when further questioned about this he states that " thinking a lot about correcting myself",  would not elaborate further.  He is denying any paranoia, delusions, ideas of reference, denies depression, anxiety.  He is also denying any history of mania, however upon chart review from prior admissions to [facility] health, it appears he has had a history of mania.  He does report a history of schizophrenia, stating he was diagnosed about 3 years ago.  Was stabilized on medications however he cannot recall the medications.  Denies having seen an outpatient mental health provider in years.  Reports 2 inpatient mental health admissions in the past, though per chart review he has been admitted here at [facility] health twice in the past.  Denies any history of suicide attempts, other than at 43 yrs old, attempted to OD on pills, no hospitalization.  Denies medical history.  Denies family history of mental illness.  Patient also reports that he has been using marijuana for the last 20 years, daily, that he puts about a gram daily and no one to 2 cigars.  Patient denies SI/HI and AVH at this moment.  Alert and oriented x 4.  Affect is constricted.  Does appear to be internally preoccupied with latent thought process.  Insight impaired, judgment fair.   Denies access to weapons.   Continued Clinical Symptoms:  Alcohol Use Disorder Identification Test Final Score (AUDIT): 1 The "Alcohol Use Disorders Identification Test", Guidelines for Use in Primary Care, Second Edition.  World Science writer [facility]). Score between 0-7:  no or low risk or alcohol related problems. Score between 8-15:  moderate risk of alcohol related problems.  Score between 16-19:  high risk of alcohol related problems. Score 20 or above:  warrants further diagnostic evaluation for alcohol dependence and treatment.   CLINICAL FACTORS:   Alcohol/Substance Abuse/Dependencies Currently Psychotic Previous Psychiatric Diagnoses and Treatments   Musculoskeletal: Strength & Muscle Tone: within normal limits Gait & Station: normal Patient  leans: N/A  Psychiatric Specialty Exam:  Presentation  General Appearance:  Appropriate for Environment  Eye Contact: Fleeting  Speech: Other (comment) (latent)  Speech Volume: Decreased  Handedness: Right   Mood and Affect  Mood: Euthymic  Affect: Appropriate; Constricted   Thought Process  Thought Processes: Other (comment) (thought blocking)  Descriptions of Associations:Intact  Orientation:Full (Time, Place and Person)  Thought Content:Scattered  History of Schizophrenia/Schizoaffective disorder:Yes  Duration of Psychotic Symptoms:Less than six months  Hallucinations:Hallucinations: Auditory; Visual Description of Auditory Hallucinations: 1 male voice, does not recognize, unable to recall content Description of Visual Hallucinations: "entities"  Ideas of Reference:None  Suicidal Thoughts:Suicidal Thoughts: No  Homicidal Thoughts:Homicidal Thoughts: No   Sensorium  Memory: Immediate Good  Judgment: Fair  Insight: Fair   Art therapist  Concentration: Fair  Attention Span: Fair  Recall: Fair  Fund of Knowledge: Fair  Language: Fair   Psychomotor Activity  Psychomotor Activity: Psychomotor Activity: Decreased   Assets  Assets: Desire for Improvement; Financial Resources/Insurance; Housing; Physical Health; Social Lawyer; Other (comment) (employed)   Sleep  Sleep: Sleep: Good Number of Hours of Sleep: 6    Physical Exam: Physical Exam ROS Blood pressure 120/76, pulse 73, temperature (!) 97.5 F (36.4 C), temperature source Temporal, resp. rate 16, height 6\' 5"  (1.956 m), weight 94.6 kg, SpO2 100%. Body mass index is 24.72 kg/m.   COGNITIVE FEATURES THAT CONTRIBUTE TO RISK:  None    SUICIDE RISK:   Minimal: No identifiable suicidal ideation.  Patients presenting with no risk factors but with morbid ruminations; may be classified as minimal risk based on the severity of the depressive  symptoms  PLAN OF CARE:  crisis stabilization, psychiatric evaluation, therapeutic milieu, group participation, medication management for psychosis, improved med adherence, and development of an individualized safety plan.  I certify that inpatient services furnished can reasonably be expected to improve the patient's condition.   Lourine Alberico, PA-C 01/09/2024, 8:53 PM

## 2024-01-09 NOTE — ED Provider Notes (Signed)
 FBC/OBS ASAP Discharge Summary  Date and Time: 01/09/2024 11:19 AM  Name: Shawn Nicholson  MRN:  [id number]   Discharge Diagnoses:  Final diagnoses:  History of medication noncompliance  Hallucination  Schizoaffective disorder, bipolar type (HCC)    Subjective: Patient seen and evaluated face-to-face by this provider, chart reviewed and case discussed with morning treatment team. On evaluation, patient is lying down in bed in no acute distress. He is alert and oriented x 4. His thought process is linear with auditory and visual hallucinations thought content. There is no objective evidence that the patient is currently responding to internal or external stimuli on exam. His mood is dysphoric and affect is congruent. He denies SI/HI. He is calm and cooperative on exam and does not display any aggressive behaviors. Patient states that for the past couple weeks he has been hearing voices that he describes as hearing noises, seeing things on the wall, feeling fatigue and irritable. He states that he had been off his medications for months. He states for the past couple days he has not been able to work due to feeling fatigued and confused. He states that he has been working at [employer] for the past 4 months. He describes his mood for the past 2 to 3 weeks as feeling fatigued, irritable, decreased concentration and difficulty remembering things. He reports difficulty sleeping, and states that he's been sleeping on and off for the past 3 weeks and reports having racing thoughts at night. He reports a good appetite. He reports smoking marijuana 1 to 2 days for a long time and states that he has never had these problems. UDS positive for THC. He reports occasional alcohol use and states that he has not consumed alcohol in a while.  With the patient's consent I spoke with his mother Ms. Scott via telephone to obtain additional information as to why the patient was brought to the behavioral health urgent care  yesterday. 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 She states that the patient's boss contacted her because he was concerned about the patient behaviors. She states that the patient has not been sleeping a full night  and typically will sleep for a couple hours and then wake up. She states that the patient has been more withdrawn and isolating. She states that the patient ran out of his medications four months ago. She states that the patient asked to go to the hospital yesterday because he didn't feel like himself.   Per chart review, patient is followed by the [facility] for medication management and last visit was yesterday, 01/08/24 and was prescribed Depakote 1,000 mg at bedtime and Zyprexa 5 mg p.o. at bedtime x 6 days day and 2 tablets (10 mg) at bedtime starting on 12/2623.   Stay Summary: Shawn Nicholson is a 43 year old male patient with a past psychiatric history significant for schizoaffective disorder, bipolar type who presented to the [facility] behavioral Health Urgent Care on 01/08/2024 accompanied by his mother due to patient not acting like himself, noncompliant with medication regimen, not sleeping or eating.  Total Time spent with patient: 30 minutes  Past Psychiatric History: History of schizoaffective disorder, bipolar type. Patient is followed by the [facility] for medication management and is prescribed Depakote 1,000 mg at  bedtime and Zyprexa 5 mg p.o. at bedtime x 6 days day and 2 tablets (10 mg) at bedtime starting on 12/2623. Patient  hospitalized at [facility] on 11/01/21 and 11/13/2018.  Past Medical History: History of seasonal allergies.  Family Psychiatric History: No reported history.  Social  History: Patient resides with his mother. Patient denies access to firearms.  Patient is employed at [employer]. Patient reports occasional marijuana use. Patient reports occasional alcohol use.  Tobacco Cessation:  N/A, patient does not currently use tobacco products  Current Medications:  Current Facility-Administered Medications  Medication Dose Route Frequency Provider Last Rate Last Admin   acetaminophen (TYLENOL) tablet 650 mg  650 mg Oral Q6H PRN Sindy Guadeloupe, NP       alum & mag hydroxide-simeth (MAALOX/MYLANTA) 200-200-20 MG/5ML suspension 30 mL  30 mL Oral Q4H PRN Sindy Guadeloupe, NP       divalproex (DEPAKOTE ER) 24 hr tablet 1,000 mg  1,000 mg Oral QHS Baudelia Schroepfer L, NP       magnesium hydroxide (MILK OF MAGNESIA) suspension 30 mL  30 mL Oral Daily PRN Sindy Guadeloupe, NP       OLANZapine (ZYPREXA) injection 10 mg  10 mg Intramuscular TID PRN Sindy Guadeloupe, NP       OLANZapine (ZYPREXA) injection 5 mg  5 mg Intramuscular TID PRN Sindy Guadeloupe, NP       OLANZapine (ZYPREXA) tablet 5 mg  5 mg Oral QHS Yaniel Limbaugh L, NP       OLANZapine zydis (ZYPREXA) disintegrating tablet 5 mg  5 mg Oral TID PRN Sindy Guadeloupe, NP       Current Outpatient Medications  Medication Sig Dispense Refill   divalproex (DEPAKOTE ER) 500 MG 24 hr tablet Take 2 tablets (1,000 mg total) by mouth at bedtime. 60 tablet 1   hydrOXYzine (ATARAX) 25 MG tablet Take 1 tablet (25 mg total) by mouth 3 (three) times daily as needed for anxiety. 75 tablet 2   OLANZapine (ZYPREXA) 5 MG tablet Take 1 tablet (5 mg total) by mouth at bedtime for 6 days, THEN 2 tablets (10 mg total) at bedtime. 60 tablet 0   traZODone (DESYREL) 50 MG tablet Take 1 tablet (50 mg total) by mouth at bedtime as needed for sleep. 30 tablet 2    PTA Medications:  Facility Ordered Medications  Medication   acetaminophen (TYLENOL) tablet 650 mg   alum & mag hydroxide-simeth (MAALOX/MYLANTA) 200-200-20 MG/5ML suspension 30 mL   magnesium hydroxide  (MILK OF MAGNESIA) suspension 30 mL   OLANZapine zydis (ZYPREXA) disintegrating tablet 5 mg   OLANZapine (ZYPREXA) injection 5 mg   OLANZapine (ZYPREXA) injection 10 mg   divalproex (DEPAKOTE ER) 24 hr tablet 1,000 mg   OLANZapine (ZYPREXA) tablet 5 mg   [COMPLETED] OLANZapine (ZYPREXA) tablet 5 mg   PTA Medications  Medication Sig   hydrOXYzine (ATARAX) 25 MG tablet Take 1 tablet (25 mg total) by mouth 3 (three) times daily as needed for anxiety.       01/08/2024    8:26 AM 01/07/2024   12:18 PM 12/18/2021    8:55 AM  Depression screen PHQ 2/9  Decreased Interest 0 2 0  Down, Depressed, Hopeless 3 1 0  PHQ - 2 Score 3 3 0  Altered sleeping 2 3   Tired, decreased energy 2 3   Change in appetite 2 3   Feeling bad or failure about yourself  3 1   Trouble concentrating 3 3  Moving slowly or fidgety/restless 2 3   Suicidal thoughts 0 0   PHQ-9 Score 17 19   Difficult doing work/chores Somewhat difficult Very difficult     Flowsheet Row ED from 01/08/2024 in [facility] Most recent reading at 01/08/2024 10:20 PM Office Visit from 01/08/2024 in [facility] Most recent reading at 01/08/2024  8:29 AM [facility] Most recent reading at 12/18/2021  8:55 AM  C-SSRS RISK CATEGORY Moderate Risk Moderate Risk No Risk       Musculoskeletal  Strength & Muscle Tone: within normal limits Gait & Station: normal Patient leans: N/A  Psychiatric Specialty Exam  Presentation  General Appearance:  Casual  Eye Contact: Fair  Speech: Clear and Coherent; Slow  Speech Volume: Decreased  Handedness: Right   Mood and Affect  Mood: Dysphoric  Affect: Congruent   Thought Process  Thought Processes: Coherent  Descriptions of Associations:Intact  Orientation:Full (Time, Place and Person)  Thought Content:Logical  Diagnosis of Schizophrenia or Schizoaffective  disorder in past: Yes  Duration of Psychotic Symptoms: Greater than six months   Hallucinations:Hallucinations: Visual; Auditory Description of Auditory Hallucinations: voices  Ideas of Reference:None  Suicidal Thoughts:Suicidal Thoughts: No  Homicidal Thoughts:Homicidal Thoughts: No   Sensorium  Memory: Immediate Fair; Recent Fair; Remote Fair  Judgment: Intact  Insight: Present   Executive Functions  Concentration: Poor  Attention Span: Poor  Recall: [employer] of Knowledge: Fair  Language: Fair   Psychomotor Activity  Psychomotor Activity: Psychomotor Activity: Normal   Assets  Assets: Communication Skills; Desire for Improvement; Financial Resources/Insurance; Housing; Leisure Time; Physical Health; Social Support   Sleep  Sleep: Sleep: Poor Number of Hours of Sleep: 6   Nutritional Assessment (For OBS and FBC admissions only) Has the patient had a weight loss or gain of 10 pounds or more in the last 3 months?: No Has the patient had a decrease in food intake/or appetite?: No Does the patient have dental problems?: No Does the patient have eating habits or behaviors that may be indicators of an eating disorder including binging or inducing vomiting?: No Has the patient recently lost weight without trying?: 0 Has the patient been eating poorly because of a decreased appetite?: 0 Malnutrition Screening Tool Score: 0    Physical Exam  Physical Exam Cardiovascular:     Rate and Rhythm: Normal rate.  Pulmonary:     Effort: Pulmonary effort is normal.  Musculoskeletal:        General: Normal range of motion.     Cervical back: Normal range of motion.  Neurological:     Mental Status: He is alert and oriented to person, place, and time.    Review of Systems  Constitutional: Negative.   HENT: Negative.    Eyes: Negative.   Respiratory: Negative.    Cardiovascular: Negative.   Gastrointestinal: Negative.   Genitourinary: Negative.    Musculoskeletal: Negative.   Neurological: Negative.   Endo/Heme/Allergies: Negative.   Psychiatric/Behavioral:  Positive for hallucinations. The patient has insomnia.    Blood pressure 118/67, pulse 85, temperature 98.7 F (37.1 C), temperature source Oral, resp. rate 18, SpO2 96%. There is no height or weight on file to calculate BMI.   Disposition: Patient accepted to [facility] for inpatient psychiatric treatment for mood stabilization. Admission orders placed for [facility]. Patient is voluntary.   Medication regimen:  Restart Depakote 1,000 mg po at bedtime for schizoaffective disorder  Restart Zyprexa 5 mg po at bedtime for schizoaffective disorder   Layla Barter, NP 01/09/2024, 11:19 AM

## 2024-01-09 NOTE — Discharge Instructions (Signed)
Transfer to [facility]

## 2024-01-09 NOTE — Tx Team (Signed)
 Initial Treatment Plan 01/09/2024 2:54 PM Shawn Nicholson ZOX:[id number]    PATIENT STRESSORS: Medication change or noncompliance     PATIENT STRENGTHS: Physical Health  Supportive family/friends    PATIENT IDENTIFIED PROBLEMS:                      DISCHARGE CRITERIA:  Improved stabilization in mood, thinking, and/or behavior Safe-care adequate arrangements made  PRELIMINARY DISCHARGE PLAN: Return to previous living arrangement  PATIENT/FAMILY INVOLVEMENT: This treatment plan has been presented to and reviewed with the patient, Shawn Nicholson, The patient and family have been given the opportunity to ask questions and make suggestions.  Velna Hatchet, RN 01/09/2024, 2:54 PM

## 2024-01-09 NOTE — Group Note (Signed)
 Date:  01/09/2024 Time:  9:48 PM  Group Topic/Focus:  Wrap-Up Group:   The focus of this group is to help patients review their daily goal of treatment and discuss progress on daily workbooks.    Participation Level:  Did Not Attend  Participation Quality:   none  Affect:   none  Cognitive:   none  Insight: None  Engagement in Group:   none  Modes of Intervention:   none  Additional Comments:  none   Belva Crome 01/09/2024, 9:48 PM

## 2024-01-09 NOTE — Group Note (Signed)
 Recreation Therapy Group Note   Group Topic:Health and Wellness  Group Date: 01/09/2024 Start Time: 1515 End Time: 1615 Facilitators: Rosina Lowenstein, LRT, CTRS Location: Courtyard  Group Description: [employer]. LRT and patients played games of basketball, drew with chalk, and played corn hole while outside in the courtyard while getting fresh air and sunlight. Music was being played in the background. LRT and peers conversed about different games they have played before, what they do in their free time and anything else that is on their minds. LRT encouraged pts to drink water after being outside, sweating and getting their heart rate up.  Goal Area(s) Addressed: Patient will build on frustration tolerance skills. Patients will partake in a competitive play game with peers. Patients will gain knowledge of new leisure interest/hobby.    Affect/Mood: N/A   Participation Level: Did not attend    Clinical Observations/Individualized Feedback: Patient did not attend group.   Plan: Continue to engage patient in RT group sessions 2-3x/week.   Rosina Lowenstein, LRT, CTRS 01/09/2024 5:08 PM

## 2024-01-09 NOTE — ED Notes (Addendum)
 Jajuan M Salak to be D/C'd  BMU  per NP order. Discussed with the patient and all questions fully answered. An EMTALA and Med Necessity forms were printed and to be given to the receiving nurse. All belongings returned. Patient escorted out and D/C home via private auto.  Dickie La  01/09/2024 12:01 PM

## 2024-01-09 NOTE — Progress Notes (Signed)
 Pt is awake, alert and oriented X3. Pt did not voice any complaints of pain or discomfort. No signs of acute distress noted. Pt denies current SI/HI/AVH, plan or intent. Staff will monitor for pt's safety,

## 2024-01-09 NOTE — H&P (Signed)
 Psychiatric Admission Assessment Adult  Patient Identification: Shawn Nicholson MRN:  [id number] Date of Evaluation:  01/09/2024 Chief Complaint:  Schizoaffective disorder (HCC) [F25.9] Principal Diagnosis: Acute psychosis (HCC) Diagnosis:  Principal Problem:   Acute psychosis (HCC) Active Problems:   Cannabis abuse   Non compliance w medication regimen  History of Present Illness:  43 yr old never married [place] American male with reported hx of schizoaffective disorder, bipolar type, cannabis abuse, non-adherence with med regimen presented to [facility] voluntarily accompanied by his mother on 01/08/2024 with chief complaints of abnormal behaviors, auditory and visual hallucinations, confusion, difficulties sleeping, concentrating, irritability. Reportedly was off his meds for about 4 months per chart review. UDS positive for THC.   Pt is seen for evaluation, reports that he was brought into the hospital because " I had a stressful moment... I had a lot on my mind and my stomach was hurting".  He would not elaborate further on what is causing him stress.  States that he has been hearing voices daily for the last 3 weeks, also having intermittent visual hallucinations of " entities... Like shadows" also for the last 3 weeks.  States that this is one voice that he hears, at first he is unable to tell the dander, then goes on to state that it is male.  States he does not recognize the voice.  Last heard yesterday.  States that he has also been having some difficulty sleeping at night, that he is only getting about 5 to 6 hours at night which is unusual for him as he usually gets 8 hours.  Reports good appetite.  States that he is also having " obsessive thoughts", when further questioned about this he states that " thinking a lot about correcting myself", would not elaborate further.  He is denying any paranoia, delusions, ideas of reference, denies depression, anxiety.  He is also denying any history of  mania, however upon chart review from prior admissions to [facility], it appears he has had a history of mania.  He does report a history of schizophrenia, stating he was diagnosed about 3 years ago.  Was stabilized on medications however he cannot recall the medications.  Denies having seen an outpatient mental health provider in years.  Reports 2 inpatient mental health admissions in the past, though per chart review he has been admitted here at [facility] twice in the past.  Denies any history of suicide attempts.  Denies medical history.  Denies family history of mental illness.  Patient also reports that he has been using marijuana for the last 20 years, daily, that he puts about a gram daily and no one to 2 cigars.  Patient denies SI/HI and AVH at this moment.  Alert and oriented x 4.  Affect is constricted.  Does appear to be internally preoccupied with latent thought process.  Insight impaired, judgment fair.   Associated Signs/Symptoms: Depression Symptoms:   denies  (Hypo) Manic Symptoms:   none  Anxiety Symptoms:   denies  Psychotic Symptoms:  Hallucinations: Auditory Visual PTSD Symptoms: NA Total Time spent with patient: 1 hour  Past Psychiatric History:  Per chart review, schizoaffective disorder, bipolar type  Patient denies outpatient provider in years. However on chart review, [facility]  Current caregiver:  Patient is own guardian/ care giver Past hospitalizations: reported by pt Awilda Metro at the age of [age], [facility] in [place] 3 yrs ago; per chart review, [facility] on  11/01/21 and 11/13/2018.  Medication trials : pt unable to recall, per chart review Zyprexa and Depakote  Suicide attempts: at 43 yrs old, attempted to OD on "pills", no hospitalization Patient denies ever having an Act/CST team. Denies ECT, Clozaril treatments.  Is the patient at risk to self? No.  Has the patient been a risk  to self in the past 6 months? No.  Has the patient been a risk to self within the distant past? Yes.    Is the patient a risk to others? No.  Has the patient been a risk to others in the past 6 months? No.  Has the patient been a risk to others within the distant past? No.   [place] Scale:  Flowsheet Row Admission (Current) from 01/09/2024 in [facility] INPATIENT BEHAVIORAL MEDICINE Most recent reading at 01/09/2024  3:00 PM ED from 01/08/2024 in [facility] Most recent reading at 01/08/2024 10:20 PM Office Visit from 01/08/2024 in [facility] Most recent reading at 01/08/2024  8:29 AM  C-SSRS RISK CATEGORY No Risk Moderate Risk Moderate Risk        Prior Inpatient Therapy: Yes. If yes, describe see above psych hx  Prior Outpatient Therapy: No.  Alcohol Screening: Patient refused Alcohol Screening Tool: Yes 1. How often do you have a drink containing alcohol?: Monthly or less 2. How many drinks containing alcohol do you have on a typical day when you are drinking?: 1 or 2 3. How often do you have six or more drinks on one occasion?: Never AUDIT-C Score: 1 4. How often during the last year have you found that you were not able to stop drinking once you had started?: Never 5. How often during the last year have you failed to do what was normally expected from you because of drinking?: Never 6. How often during the last year have you needed a first drink in the morning to get yourself going after a heavy drinking session?: Never 7. How often during the last year have you had a feeling of guilt of remorse after drinking?: Never 8. How often during the last year have you been unable to remember what happened the night before because you had been drinking?: Never 9. Have you or someone else been injured as a result of your drinking?: No 10. Has a relative or friend or a doctor or another health worker been concerned about your drinking or  suggested you cut down?: No Alcohol Use Disorder Identification Test Final Score (AUDIT): 1 Alcohol Brief Interventions/Follow-up: Patient Refused Substance Abuse History in the last 12 months:  Yes.   Consequences of Substance Abuse: Negative Previous Psychotropic Medications: Yes  Psychological Evaluations: No  Past Medical History:  Past Medical History:  Diagnosis Date   Allergy    Anxiety    no meds   Boxer's fracture    RLF   Depression    Substance abuse (HCC)     Past Surgical History:  Procedure Laterality Date   CLOSED REDUCTION METACARPAL WITH PERCUTANEOUS PINNING  10/23/2012   Procedure: CLOSED REDUCTION METACARPAL WITH PERCUTANEOUS PINNING;  Surgeon: Tami Ribas, MD;  Location: [facility];  Service: Orthopedics;  Laterality: Right;   OPEN REDUCTION INTERNAL FIXATION (ORIF) METACARPAL  10/23/2012   Procedure: OPEN REDUCTION INTERNAL FIXATION (ORIF) METACARPAL;  Surgeon: Tami Ribas, MD;  Location: [facility];  Service: Orthopedics;;  RIGHT SMALL AND RING FINGER ORIF VERSUS CLOSED REDUCTION PERCUTANEOUS PINNING  Family History:  Family History  Problem Relation Age of Onset   Hypertension Father    Arthritis Mother    Arthritis Other    Hypertension Other    Cancer Neg Hx    Alcohol abuse Neg Hx    Depression Neg Hx    Diabetes Neg Hx    Drug abuse Neg Hx    Early death Neg Hx    Heart disease Neg Hx    Hyperlipidemia Neg Hx    Kidney disease Neg Hx    Stroke Neg Hx    Family Psychiatric  History: pt denies  Tobacco Screening:  Social History   Tobacco Use  Smoking Status Light Smoker   Types: Cigarettes  Smokeless Tobacco Never    BH Tobacco Counseling     Are you interested in Tobacco Cessation Medications?  No value filed. Counseled patient on smoking cessation:  No value filed. Reason Tobacco Screening Not Completed: No value filed.       Social History:  Social History   Substance and Sexual Activity   Alcohol Use Yes   Alcohol/week: 3.0 standard drinks of alcohol   Types: 3 Cans of beer per week   Comment: social     Social History   Substance and Sexual Activity  Drug Use Yes   Frequency: 7.0 times per week   Types: Marijuana   Comment: daily, wrapped in his cigars, abt 1 gram daily    Additional Social History:                           Allergies:   Allergies  Allergen Reactions   Other Other (See Comments)    Reaction to pecans showed up on allergy test   Penicillins Rash   Lab Results:  Results for orders placed or performed during the hospital encounter of 01/09/24 (from the past 48 hours)  Lipid panel     Status: Abnormal   Collection Time: 01/09/24  1:57 PM  Result Value Ref Range   Cholesterol 127 0 - 200 mg/dL   Triglycerides 782 <956 mg/dL   HDL 29 (L) >21 mg/dL   Total CHOL/HDL Ratio 4.4 RATIO   VLDL 20 0 - 40 mg/dL   LDL Cholesterol 78 0 - 99 mg/dL    Comment:        Total Cholesterol/HDL:CHD Risk Coronary Heart Disease Risk Table                     Men   Women  1/2 Average Risk   3.4   3.3  Average Risk       5.0   4.4  2 X Average Risk   9.6   7.1  3 X Average Risk  23.4   11.0        Use the calculated Patient Ratio above and the CHD Risk Table to determine the patient's CHD Risk.        ATP III CLASSIFICATION (LDL):  <100     mg/dL   Optimal  308-657  mg/dL   Near or Above                    Optimal  130-159  mg/dL   Borderline  846-962  mg/dL   High  >952     mg/dL   Very High Performed at [facility], [address] [place], [place] [address]     Blood Alcohol  level:  Lab Results  Component Value Date   ETH <10 01/08/2024   ETH <10 10/30/2021    Metabolic Disorder Labs:  Lab Results  Component Value Date   HGBA1C 4.9 10/30/2021   MPG 93.93 10/30/2021   MPG 96.8 11/13/2018   No results found for: "PROLACTIN" Lab Results  Component Value Date   CHOL 127 01/09/2024   TRIG 102 01/09/2024   HDL 29  (L) 01/09/2024   CHOLHDL 4.4 01/09/2024   VLDL 20 01/09/2024   LDLCALC 78 01/09/2024   LDLCALC 87 10/30/2021    Current Medications: Current Facility-Administered Medications  Medication Dose Route Frequency Provider Last Rate Last Admin   acetaminophen (TYLENOL) tablet 650 mg  650 mg Oral Q6H PRN Armeda Plumb, PA-C       alum & mag hydroxide-simeth (MAALOX/MYLANTA) 200-200-20 MG/5ML suspension 30 mL  30 mL Oral Q4H PRN Savier Trickett, PA-C       haloperidol (HALDOL) tablet 5 mg  5 mg Oral TID PRN Cassanda Walmer, PA-C       And   diphenhydrAMINE (BENADRYL) capsule 50 mg  50 mg Oral TID PRN Konstantine Gervasi, PA-C       haloperidol lactate (HALDOL) injection 5 mg  5 mg Intramuscular TID PRN Rayme Bui, PA-C       And   diphenhydrAMINE (BENADRYL) injection 50 mg  50 mg Intramuscular TID PRN Jahaad Penado, PA-C       And   LORazepam (ATIVAN) injection 2 mg  2 mg Intramuscular TID PRN Marlinda Miranda, PA-C       haloperidol lactate (HALDOL) injection 10 mg  10 mg Intramuscular TID PRN Caprice Mccaffrey, PA-C       And   diphenhydrAMINE (BENADRYL) injection 50 mg  50 mg Intramuscular TID PRN Geneen Dieter, PA-C       And   LORazepam (ATIVAN) injection 2 mg  2 mg Intramuscular TID PRN Ryann Leavitt, PA-C       divalproex (DEPAKOTE ER) 24 hr tablet 1,000 mg  1,000 mg Oral QHS Arkin Imran, PA-C       hydrOXYzine (ATARAX) tablet 25 mg  25 mg Oral TID PRN Lian Pounds, PA-C       magnesium hydroxide (MILK OF MAGNESIA) suspension 30 mL  30 mL Oral Daily PRN Clarann Helvey, PA-C       OLANZapine (ZYPREXA) tablet 5 mg  5 mg Oral QHS Barbara Ahart, PA-C       PTA Medications: Medications Prior to Admission  Medication Sig Dispense Refill Last Dose/Taking   divalproex (DEPAKOTE ER) 500 MG 24 hr tablet Take 2 tablets (1,000 mg total) by mouth at bedtime. (Patient not taking: Reported on 01/09/2024) 60 tablet 1     hydrOXYzine (ATARAX) 25 MG tablet Take 1 tablet (25 mg total) by mouth 3 (three) times daily as needed for anxiety. (Patient not taking: Reported on 01/09/2024) 75 tablet 2    OLANZapine (ZYPREXA) 5 MG tablet Take 1 tablet (5 mg total) by mouth at bedtime for 6 days, THEN 2 tablets (10 mg total) at bedtime. (Patient not taking: Reported on 01/09/2024) 60 tablet 0    traZODone (DESYREL) 50 MG tablet Take 1 tablet (50 mg total) by mouth at bedtime as needed for sleep. (Patient not taking: Reported on 01/09/2024) 30 tablet 2     Musculoskeletal: Strength & Muscle Tone: within normal limits Gait & Station: normal Patient leans: N/A            Psychiatric Specialty Exam:  Presentation  General Appearance:  Appropriate for Environment  Eye Contact: Fleeting  Speech: Other (comment) (latent)  Speech Volume: Decreased  Handedness: Right   Mood and Affect  Mood: Euthymic  Affect: Appropriate; Constricted   Thought Process  Thought Processes: Other (comment) (thought blocking)  Duration of Psychotic Symptoms:N/A Past Diagnosis of Schizophrenia or Psychoactive disorder: Yes  Descriptions of Associations:Intact  Orientation:Full (Time, Place and Person)  Thought Content:Scattered  Hallucinations:Hallucinations: Auditory; Visual Description of Auditory Hallucinations: 1 male voice, does not recognize, unable to recall content Description of Visual Hallucinations: "entities"  Ideas of Reference:None  Suicidal Thoughts:Suicidal Thoughts: No  Homicidal Thoughts:Homicidal Thoughts: No   Sensorium  Memory: Immediate Good  Judgment: Fair  Insight: Fair   Art therapist  Concentration: Fair  Attention Span: Fair  Recall: Fair  Fund of Knowledge: Fair  Language: Fair   Psychomotor Activity  Psychomotor Activity: Psychomotor Activity: Decreased   Assets  Assets: Desire for Improvement; Financial Resources/Insurance; Housing;  Physical Health; Social Lawyer; Other (comment) (employed)   Sleep  Sleep: Sleep: Good Number of Hours of Sleep: 6    Physical Exam: Physical Exam Vitals and nursing note reviewed.  HENT:     Head: Normocephalic and atraumatic.     Nose: Nose normal.     Mouth/Throat:     Mouth: Mucous membranes are moist.  Eyes:     Extraocular Movements: Extraocular movements intact.  Pulmonary:     Effort: Pulmonary effort is normal.  Musculoskeletal:        General: Normal range of motion.     Cervical back: Normal range of motion.  Skin:    General: Skin is warm and dry.  Neurological:     General: No focal deficit present.     Mental Status: He is alert and oriented to person, place, and time.  Psychiatric:        Attention and Perception: He is inattentive. He perceives auditory and visual hallucinations.        Mood and Affect: Mood normal.        Speech: Speech is delayed.        Behavior: Behavior is slowed and withdrawn. Behavior is cooperative.        Thought Content: Thought content normal.        Cognition and Memory: Cognition and memory normal.     Comments: Judgement fair     Review of Systems  Psychiatric/Behavioral:  Positive for hallucinations and substance abuse.   All other systems reviewed and are negative.  Blood pressure 120/76, pulse 73, temperature (!) 97.5 F (36.4 C), temperature source Temporal, resp. rate 16, height 6\' 5"  (1.956 m), weight 94.6 kg, SpO2 100%. Body mass index is 24.72 kg/m.  Treatment Plan Summary: Daily contact with patient to assess and evaluate symptoms and progress in treatment and Medication management  Observation Level/Precautions:  15 minute checks  Laboratory:  HbAIC Lipid panel, EKG   Psychotherapy:    Medications:  Depakote, Zyprexa  Consultations:    Discharge Concerns:  substance abuse, non-adherence w/ meds   Estimated LOS: 5-7 days   Other:     Physician Treatment Plan for Primary Diagnosis:   Principal Problem:   Acute psychosis (HCC) Active Problems:   Cannabis abuse   Non compliance w medication regimen  1.    Safety and Monitoring:   --  Voluntary admission to inpatient psychiatric unit for safety, stabilization and treatment -- Daily contact with patient to assess and evaluate symptoms and progress  in treatment -- Patient's case to be discussed in multi-disciplinary team meeting -- Observation Level : q15 minute checks -- Vital signs:  q12 hours -- Precautions: none   2. Psychiatric Diagnoses and Treatment:    -- Continue Depakote ER 1000 mg HS for mood stabilization   -- Continue Zyprexa 5 mg HS for psychosis  -- Continue Gabapentin 100mg  PO BID PRN anxiety  -- Start mirtazapine 7.5mg  PO at bedtime for MDD and target associated anxiety, insomnia  --  The risks/benefits/side-effects/alternatives to this medication were discussed in detail with the patient and time was given for questions. The patient consents to medication trial.  -- Metabolic profile and EKG monitoring obtained while on an atypical antipsychotic  (BMI: 24.72; Lipid Panel: pending;  HbgA1c: pending; QTc: pending)  -- Encouraged patient to participate in unit milieu and in scheduled group therapies  -- Short Term Goals: Ability to identify changes in lifestyle to reduce recurrence of condition will improve, Ability to verbalize feelings will improve, Ability to disclose and discuss suicidal ideas, Ability to demonstrate self-control will improve, Ability to identify and develop effective coping behaviors will improve, Ability to maintain clinical measurements within normal limits will improve, Compliance with prescribed medications will improve, and Ability to identify triggers associated with substance abuse/mental health issues will improve -- Long Term Goals: Improvement in symptoms so as ready for discharge        3. Medical Issues Being Addressed: Marijuana abuse  --Counseled on risks of continued  use   Tobacco use  -- encouraged smoking cessation, counseled    4. Discharge Planning:   -- Social work and case management to assist with discharge planning and identification of hospital follow-up needs prior to discharge -- Estimated LOS: 5-7 days -- Discharge Concerns: Need to establish a safety plan; Medication compliance and effectiveness -- Discharge Goals: Return home with outpatient referrals for mental health follow-up including medication management/psychotherapy   I certify that inpatient services furnished can reasonably be expected to improve the patient's condition.    Loraine Freid, PA-C 2/27/20258:29 PM

## 2024-01-09 NOTE — ED Notes (Signed)
 Report given to Darl Pikes, RN BMU.

## 2024-01-10 DIAGNOSIS — F23 Brief psychotic disorder: Secondary | ICD-10-CM

## 2024-01-10 DIAGNOSIS — F250 Schizoaffective disorder, bipolar type: Secondary | ICD-10-CM

## 2024-01-10 MED ORDER — OLANZAPINE 10 MG PO TABS
10.0000 mg | ORAL_TABLET | Freq: Every day | ORAL | Status: DC
  Administered 2024-01-10: 10 mg via ORAL
  Filled 2024-01-10: qty 1

## 2024-01-10 NOTE — Group Note (Signed)
 Recreation Therapy Group Note   Group Topic:Leisure Education  Group Date: 01/10/2024 Start Time: 1000 End Time: 1100 Facilitators: Rosina Lowenstein, LRT, CTRS Location:  Craft Room  Group Description: Leisure. Patients were given the option to choose from singing karaoke, coloring mandalas, using oil pastels, journaling, or playing with play-doh. LRT and pts discussed the meaning of leisure, the importance of participating in leisure during their free time/when they're outside of the hospital, as well as how our leisure interests can also serve as coping skills.   Goal Area(s) Addressed:  Patient will identify a current leisure interest.  Patient will learn the definition of "leisure". Patient will practice making a positive decision. Patient will have the opportunity to try a new leisure activity. Patient will communicate with peers and LRT.    Affect/Mood: N/A   Participation Level: Did not attend    Clinical Observations/Individualized Feedback: Patient did not attend group.   Plan: Continue to engage patient in RT group sessions 2-3x/week.   Rosina Lowenstein, LRT, CTRS 01/10/2024 11:57 AM

## 2024-01-10 NOTE — Group Note (Signed)
 Date:  01/10/2024 Time:  5:42 PM  Group Topic/Focus:  Building Self Esteem:   The Focus of this group is helping patients become aware of the effects of self-esteem on their lives, the things they and others do that enhance or undermine their self-esteem, seeing the relationship between their level of self-esteem and the choices they make and learning ways to enhance self-esteem. Coping With Mental Health Crisis:   The purpose of this group is to help patients identify strategies for coping with mental health crisis.  Group discusses possible causes of crisis and ways to manage them effectively.     Participation Level:  Active  Participation Quality:  Appropriate  Affect:  Appropriate  Cognitive:  Appropriate  Insight: Appropriate  Engagement in Group:  Improving  Modes of Intervention:  Activity  Additional Comments:    Maddex Garlitz L Daisy Lites 01/10/2024, 5:42 PM

## 2024-01-10 NOTE — Plan of Care (Signed)
  Problem: Education: Goal: Knowledge of [place] [facility] Education information/materials will improve 01/10/2024 0621 by Neysa Bonito, RN Outcome: Progressing 01/10/2024 0416 by Neysa Bonito, RN Outcome: Progressing Goal: Emotional status will improve 01/10/2024 0621 by Neysa Bonito, RN Outcome: Progressing 01/10/2024 0416 by Neysa Bonito, RN Outcome: Progressing

## 2024-01-10 NOTE — Progress Notes (Signed)
 [facility] MD Progress Note  01/10/2024 10:58 PM Shawn Nicholson  MRN:  [id number]   43 yr old never married [place] American male with reported hx of schizoaffective disorder, bipolar type, cannabis abuse, non-adherence with med regimen presented to [facility] voluntarily accompanied by his mother on 01/08/2024 with chief complaints of abnormal behaviors, auditory and visual hallucinations, confusion, difficulties sleeping, concentrating, irritability. Reportedly was off his meds for about 4 months per chart review. UDS positive for THC.    Subjective:   Chart reviewed, case discussed with multidisciplinary team, pt seen for treatment team. No behavioral issues reported overnight. Nurses state that he has been isolative to room overnight. Today he was observed in the courtyard during recreational activity/outdoor activity, isolated, standing off to the side by himself. Reports minimal improvement in auditory hallucinations. Continues with some thought blocking. Today he is able to verify that he was indeed stabilized on psychotropic medications for the last few years, however he ran out about 4 months ago. Appears anxious. Insight and judgment fair. Alert and oriented x4.   Collateral obtained from pts mother by SW team: 'During conversation, patient's mother stated that when medicated, the patient is typically "normal," but when off medication, he becomes "all over the place" and easily distracted. She reported that he has been off his medication for four months. Over the past three weeks, she described their experience as a "struggle," and the patient remained "stable but unable to focus." Mother reports that this week, the patient "derailed." The mother explained that the patient is usually compliant with his medication and responds well to her guidance. She emphasized that he is "always able to come home" and confirmed that she will provide transportation upon discharge. Additionally, she shared that she works  in the mental health field and is able to redirect him as needed. She confirmed that the patient is not a danger to himself or others, has no direct access to weapons, and has a strong support system in place.'  Principal Problem: Acute psychosis (HCC) Diagnosis: Principal Problem:   Acute psychosis (HCC) Active Problems:   Cannabis abuse   Non compliance w medication regimen  Total Time spent with patient: 20 minutes  Past Psychiatric History:  Per chart review, schizoaffective disorder, bipolar type  Patient denies outpatient provider in years. However on chart review, [facility]  Current caregiver:  Patient is own guardian/ care giver Past hospitalizations: reported by pt Shawn Nicholson at the age of [age], [facility] in [place] 3 yrs ago; per chart review, [facility] on 11/01/21 and 11/13/2018.  Medication trials : pt unable to recall, per chart review Zyprexa and Depakote  Suicide attempts: at 44 yrs old, attempted to OD on "pills", no hospitalization Patient denies ever having an Act/CST team. Denies ECT, Clozaril treatments.  Past Medical History:  Past Medical History:  Diagnosis Date   Allergy    Anxiety    no meds   Boxer's fracture    RLF   Depression    Substance abuse (HCC)     Past Surgical History:  Procedure Laterality Date   CLOSED REDUCTION METACARPAL WITH PERCUTANEOUS PINNING  10/23/2012   Procedure: CLOSED REDUCTION METACARPAL WITH PERCUTANEOUS PINNING;  Surgeon: Shawn Ribas, MD;  Location: [facility];  Service: Orthopedics;  Laterality: Right;   OPEN REDUCTION INTERNAL FIXATION (ORIF) METACARPAL  10/23/2012   Procedure: OPEN REDUCTION INTERNAL FIXATION (ORIF) METACARPAL;  Surgeon: Shawn Ribas, MD;  Location:  [facility];  Service: Orthopedics;;  RIGHT SMALL AND RING FINGER ORIF VERSUS CLOSED REDUCTION PERCUTANEOUS PINNING   Family History:  Family  History  Problem Relation Age of Onset   Hypertension Father    Arthritis Mother    Arthritis Other    Hypertension Other    Cancer Neg Hx    Alcohol abuse Neg Hx    Depression Neg Hx    Diabetes Neg Hx    Drug abuse Neg Hx    Early death Neg Hx    Heart disease Neg Hx    Hyperlipidemia Neg Hx    Kidney disease Neg Hx    Stroke Neg Hx    Family Psychiatric  History: denies Social History:  Social History   Substance and Sexual Activity  Alcohol Use Yes   Alcohol/week: 3.0 standard drinks of alcohol   Types: 3 Cans of beer per week   Comment: social     Social History   Substance and Sexual Activity  Drug Use Yes   Frequency: 7.0 times per week   Types: Marijuana   Comment: daily, wrapped in his cigars, abt 1 gram daily    Social History   Socioeconomic History   Marital status: Single    Spouse name: Not on file   Number of children: 0   Years of education: Not on file   Highest education level: Some college, no degree  Occupational History   Not on file  Tobacco Use   Smoking status: Light Smoker    Types: Cigarettes   Smokeless tobacco: Never  Vaping Use   Vaping status: Never Used  Substance and Sexual Activity   Alcohol use: Yes    Alcohol/week: 3.0 standard drinks of alcohol    Types: 3 Cans of beer per week    Comment: social   Drug use: Yes    Frequency: 7.0 times per week    Types: Marijuana    Comment: daily, wrapped in his cigars, abt 1 gram daily   Sexual activity: Not Currently  Other Topics Concern   Not on file  Social History Narrative   Lives at home with his mother    Social Drivers of Corporate investment banker Strain: Not on file  Food Insecurity: No Food Insecurity (01/09/2024)   Hunger Vital Sign    Worried About Running Out of Food in the Last Year: Never true    Ran Out of Food in the Last Year: Never true  Transportation Needs: No Transportation Needs (01/09/2024)   PRAPARE - Administrator, Civil Service  (Medical): No    Lack of Transportation (Non-Medical): No  Physical Activity: Not on file  Stress: Not on file  Social Connections: Not on file   Additional Social History:                         Sleep: Good  Appetite:  Good  Current Medications: Current Facility-Administered Medications  Medication Dose Route Frequency Provider Last Rate Last Admin   acetaminophen (TYLENOL) tablet 650 mg  650 mg Oral Q6H PRN Danzel Marszalek, PA-C       alum & mag hydroxide-simeth (MAALOX/MYLANTA) 200-200-20 MG/5ML suspension 30 mL  30 mL Oral Q4H PRN Galia Rahm, PA-C       haloperidol (HALDOL) tablet 5 mg  5 mg Oral TID PRN Helene Bernstein, PA-C       And   diphenhydrAMINE (BENADRYL) capsule 50 mg  50 mg Oral TID PRN Michaiah Holsopple, PA-C       haloperidol lactate (HALDOL) injection 5 mg  5 mg Intramuscular TID PRN Jalei Shibley, PA-C       And   diphenhydrAMINE (BENADRYL) injection 50 mg  50 mg Intramuscular TID PRN Dshawn Mcnay, PA-C       And   LORazepam (ATIVAN) injection 2 mg  2 mg Intramuscular TID PRN Olean Sangster, PA-C       haloperidol lactate (HALDOL) injection 10 mg  10 mg Intramuscular TID PRN Sania Noy, PA-C       And   diphenhydrAMINE (BENADRYL) injection 50 mg  50 mg Intramuscular TID PRN Abijah Roussel, PA-C       And   LORazepam (ATIVAN) injection 2 mg  2 mg Intramuscular TID PRN Nailani Full, PA-C       divalproex (DEPAKOTE ER) 24 hr tablet 1,000 mg  1,000 mg Oral QHS Tajon Moring, PA-C   1,000 mg at 01/10/24 2142   hydrOXYzine (ATARAX) tablet 25 mg  25 mg Oral TID PRN Deja Kaigler, PA-C   25 mg at 01/10/24 0855   magnesium hydroxide (MILK OF MAGNESIA) suspension 30 mL  30 mL Oral Daily PRN Kele Barthelemy, PA-C       OLANZapine (ZYPREXA) tablet 10 mg  10 mg Oral QHS Joeanthony Seeling, PA-C   10 mg at 01/10/24 2142   traZODone (DESYREL) tablet 50 mg  50 mg Oral QHS PRN Ivor Kishi,  Judeth Cornfield, PA-C        Lab Results:  Results for orders placed or performed during the hospital encounter of 01/09/24 (from the past 48 hours)  Lipid panel     Status: Abnormal   Collection Time: 01/09/24  1:57 PM  Result Value Ref Range   Cholesterol 127 0 - 200 mg/dL   Triglycerides 161 <096 mg/dL   HDL 29 (L) >04 mg/dL   Total CHOL/HDL Ratio 4.4 RATIO   VLDL 20 0 - 40 mg/dL   LDL Cholesterol 78 0 - 99 mg/dL    Comment:        Total Cholesterol/HDL:CHD Risk Coronary Heart Disease Risk Table                     Men   Women  1/2 Average Risk   3.4   3.3  Average Risk       5.0   4.4  2 X Average Risk   9.6   7.1  3 X Average Risk  23.4   11.0        Use the calculated Patient Ratio above and the CHD Risk Table to determine the patient's CHD Risk.        ATP III CLASSIFICATION (LDL):  <100     mg/dL   Optimal  540-981  mg/dL   Near or Above                    Optimal  130-159  mg/dL   Borderline  191-478  mg/dL   High  >295     mg/dL   Very High Performed at [facility], [address] [place], [place] [address]   Hemoglobin A1c     Status: None   Collection Time: 01/09/24  1:57 PM  Result Value Ref Range   Hgb A1c MFr Bld 5.4 4.8 - 5.6 %    Comment: (NOTE) Pre diabetes:          5.7%-6.4%  Diabetes:              >6.4%  Glycemic control for   <7.0% adults with diabetes    Mean Plasma Glucose 108.28 mg/dL    Comment: Performed at [facility], [address], [place], [place] [address]    Blood Alcohol level:  Lab Results  Component Value Date   [facility] <10 01/08/2024   ETH <10 10/30/2021    Metabolic Disorder Labs: Lab Results  Component Value Date   HGBA1C 5.4 01/09/2024   MPG 108.28 01/09/2024   MPG 93.93 10/30/2021   No results found for: "PROLACTIN" Lab Results  Component Value Date   CHOL 127 01/09/2024   TRIG 102 01/09/2024   HDL 29 (L) 01/09/2024   CHOLHDL 4.4 01/09/2024   VLDL 20 01/09/2024   LDLCALC 78 01/09/2024    LDLCALC 87 10/30/2021    Physical Findings: AIMS:  , ,  ,  ,    CIWA:    COWS:     Musculoskeletal: Strength & Muscle Tone: within normal limits Gait & Station: normal Patient leans: N/A  Psychiatric Specialty Exam:  Presentation  General Appearance:  Appropriate for Environment  Eye Contact: Fleeting  Speech: Other (comment) (latent)  Speech Volume: Decreased  Handedness: Right   Mood and Affect  Mood: Euthymic  Affect: Appropriate; Constricted   Thought Process  Thought Processes: Other (comment) (thought blocking)  Descriptions of Associations:Intact  Orientation:Full (Time, Place and Person)  Thought Content:Scattered  History of Schizophrenia/Schizoaffective disorder:Yes  Duration of Psychotic Symptoms:Less than six months  Hallucinations:Hallucinations: Auditory; Visual Description of Auditory Hallucinations: 1 male voice, does not recognize, unable to recall content Description of Visual Hallucinations: "entities"  Ideas of Reference:None  Suicidal Thoughts:Suicidal Thoughts: No  Homicidal Thoughts:Homicidal Thoughts: No   Sensorium  Memory: Immediate Good  Judgment: Fair  Insight: Fair   Art therapist  Concentration: Fair  Attention Span: Fair  Recall: Fair  Fund of Knowledge: Fair  Language: Fair   Psychomotor Activity  Psychomotor Activity: Psychomotor Activity: Decreased   Assets  Assets: Desire for Improvement; Financial Resources/Insurance; Housing; Physical Health; Social Lawyer; Other (comment) (employed)   Sleep  Sleep: Sleep: Good    Physical Exam: Physical Exam Vitals and nursing note reviewed.  Constitutional:      Appearance: Normal appearance.  HENT:     Head: Normocephalic and atraumatic.     Nose: Nose normal.     Mouth/Throat:     Mouth: Mucous membranes are moist.  Eyes:     Extraocular Movements: Extraocular movements intact.  Pulmonary:      Effort: Pulmonary effort is normal.  Musculoskeletal:        General: Normal range of motion.     Cervical back: Normal range of motion.  Skin:    General: Skin is warm and dry.  Neurological:     General: No focal deficit present.     Mental Status: He is alert and oriented to person, place, and time.  Psychiatric:        Attention and Perception: He is inattentive. He perceives auditory hallucinations.        Mood and Affect: Mood and affect normal.        Speech: Speech is delayed.        Behavior: Behavior is slowed. Behavior is cooperative.        Thought Content: Thought content normal.        Cognition and Memory: Cognition normal. He exhibits impaired recent  memory.     Comments: Judgement fair     Review of Systems  Psychiatric/Behavioral:  Positive for hallucinations. The patient is nervous/anxious.   All other systems reviewed and are negative.  Blood pressure 120/84, pulse 70, temperature (!) 97.4 F (36.3 C), resp. rate 16, height 6\' 5"  (1.956 m), weight 94.6 kg, SpO2 100%. Body mass index is 24.72 kg/m.   Treatment Plan Summary:  1.    Safety and Monitoring:   --  Voluntary admission to inpatient psychiatric unit for safety, stabilization and treatment -- Daily contact with patient to assess and evaluate symptoms and progress in treatment -- Patient's case to be discussed in multi-disciplinary team meeting -- Observation Level : q15 minute checks -- Vital signs:  q12 hours -- Precautions: none   2. Psychiatric Diagnoses and Treatment:   01/10/2024  -- Continue Depakote ER 1000 mg HS for mood stabilization   -- Increase  Zyprexa to 10 mg HS for psychosis  -- Continue Gabapentin 100mg  PO BID PRN anxiety  -- Start mirtazapine 7.5mg  PO at bedtime for MDD and target associated anxiety, insomnia    01/09/2024  -- Continue Depakote ER 1000 mg HS for mood stabilization   -- Continue  Zyprexa 5 mg HS for psychosis  -- Continue Gabapentin 100mg  PO BID PRN  anxiety  -- Start mirtazapine 7.5mg  PO at bedtime for MDD and target associated anxiety, insomnia   --  The risks/benefits/side-effects/alternatives to this medication were discussed in detail with the patient and time was given for questions. The patient consents to medication trial.  -- Metabolic profile and EKG monitoring obtained while on an atypical antipsychotic  (BMI: 24.72; Lipid Panel: WNL;  HbgA1c: 5.4; QTc: 442)  -- Encouraged patient to participate in unit milieu and in scheduled group therapies  -- Short Term Goals: Ability to identify changes in lifestyle to reduce recurrence of condition will improve, Ability to verbalize feelings will improve, Ability to disclose and discuss suicidal ideas, Ability to demonstrate self-control will improve, Ability to identify and develop effective coping behaviors will improve, Ability to maintain clinical measurements within normal limits will improve, Compliance with prescribed medications will improve, and Ability to identify triggers associated with substance abuse/mental health issues will improve -- Long Term Goals: Improvement in symptoms so as ready for discharge        3. Medical Issues Being Addressed: Marijuana abuse             --Counseled on risks of continued use               Tobacco use             -- encouraged smoking cessation, counseled    4. Discharge Planning:   -- Social work and case management to assist with discharge planning and identification of hospital follow-up needs prior to discharge -- Estimated LOS: 5-7 days -- Discharge Concerns: Need to establish a safety plan; Medication compliance and effectiveness -- Discharge Goals: Return home with outpatient referrals for mental health follow-up including medication management/psychotherapy  Paulene Floor, PA-C 01/10/2024, 10:58 PM

## 2024-01-10 NOTE — Group Note (Signed)
 Date:  01/10/2024 Time:  8:36 PM  Group Topic/Focus:  Spirituality:   The focus of this group is to discuss how one's spirituality can aide in recovery.    Participation Level:  Did Not Attend  Participation Quality:   NONE  Affect:   NONE  Cognitive:   NONE  Insight: None  Engagement in Group:   NONE  Modes of Intervention:   NONE  Additional Comments:  NONE   Climmie Cronce 01/10/2024, 8:36 PM

## 2024-01-10 NOTE — [facility] Counselor (Signed)
 Adult Comprehensive Assessment  Patient ID: Shawn Nicholson, male   DOB: [birth year], 43 y.o.   MRN: [id number]  Information Source: Information source: Patient  Current Stressors:  Patient states their primary concerns and needs for treatment are:: "I was having difficulties thinking. I was having fatigue and confusing thoughts." Patient states their goals for this hospitilization and ongoing recovery are:: "To better myself and get organized." Educational / Learning stressors: Patient denied. 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 Social relationships: "No." Substance abuse: Patient reports daily marijuana use. Bereavement / Loss: Patient denies.  Living/Environment/Situation:  Living Arrangements: Parent Living conditions (as described by patient or guardian): WNL Who else lives in the home?: "With my mom." How long has patient lived in current situation?: "Some years."  Family History:  Marital status: Single Are you sexually active?: No What is your sexual orientation?: Heterosexual Has your sexual activity been affected by drugs, alcohol, medication, or emotional stress?: Patient denies. Does patient have children?: No  Childhood History:  By whom was/is the patient raised?: Other (Comment), Mother/father and step-parent, Grandparents Description of patient's relationship with caregiver when they were a child: "I believe it was good from what I can remember." Patient's description of current relationship with people who raised him/her: "I'm not sure." How were you disciplined when you got in trouble as a child/adolescent?: "Whoopings and  punishments." Does patient have siblings?: Yes Number of Siblings: 6 Description of patient's current relationship with siblings: "Pretty good." Did patient suffer any verbal/emotional/physical/sexual abuse as a child?: No Did patient suffer from severe childhood neglect?: No Has patient ever been sexually abused/assaulted/raped as an adolescent or adult?: No Was the patient ever a victim of a crime or a disaster?: No Witnessed domestic violence?: No Has patient been affected by domestic violence as an adult?: No  Education:  Highest grade of school patient has completed: "Some college." Currently a student?: No Learning disability?: Yes What learning problems does patient have?: "ADHD"  Employment/Work Situation:   Employment Situation: Employed Where is Patient Currently Employed?: "[employer]." How Long has Patient Been Employed?: "Four months." Are You Satisfied With Your Job?: Yes Do You Work More Than One Job?: Yes 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?: No  Financial Resources:   Financial resources: Income from employment Does patient have a representative payee or guardian?: No  Alcohol/Substance Abuse:   What has been your use of drugs/alcohol within the last 12 months?: Patient reports daily marijuna use and social alcohol use. If attempted suicide, did drugs/alcohol play a role in this?: No Alcohol/Substance Abuse Treatment Hx: Denies past history Has alcohol/substance abuse ever caused legal problems?: No  Social Support System:   Patient's Community Support System: Good Describe Community Support System: "Family and nature." Type of faith/religion: "I'm into  spirituality." How does patient's faith help to cope with current illness?: "I can't quite explain that."  Leisure/Recreation:   Do  You Have Hobbies?: Yes Leisure and Hobbies: Patient reports that he enjoys walking and bike riding,   traveling, makes music, plays music, arts & crafts, sculptures, shopping, eating out and dancing.  Strengths/Needs:   What is the patient's perception of their strengths?: "I'm good at music." Patient states they can use these personal strengths during their treatment to contribute to their recovery: "I can use music as therapy." Patient states these barriers may affect/interfere with their treatment: None reported. Patient states these barriers may affect their return to the community: None reported.  Discharge Plan:   Currently receiving community mental health services: No Patient states concerns and preferences for aftercare planning are: None reported. Patient states they will know when they are safe and ready for discharge when: "I'm not sure." Does patient have access to transportation?: No Does patient have financial barriers related to discharge medications?: No Patient description of barriers related to discharge medications: None reported. Plan for no access to transportation at discharge: CSW to assist with transportation needs. Will patient be returning to same living situation after discharge?: Yes  Summary/Recommendations:   Summary and Recommendations (to be completed by the evaluator): Patient is a 43 year old [place] male with reported history of schizoaffective disorder, bipolar type, cannabis abuse, non-adherence who initially presented to [facility] voluntarily accompanied by his mother with complaints of abnormal behaviors, auditory and visual hallucinations, confusion, difficulties sleeping, concentrating and irritability. Patient endorsed employment stressors primarily. Patient reports that he has "trouble with all of the packages and getting  them organized." Patient reports working at [employer] for 4 months and also being a Glass blower/designer as a second job. Patient reports that his primary job "has its ups and downs" and that on a typical work day he is known "to get behind." Patient currently resides with his mother where he described the living atmosphere as "supportive." Patient reports "difficulties thinking and having fatigue and confusing thoughts." Patient described his support system of his mother "and nature" as adequate support. Patient also reports that he has been using marijuana for the last 20 years, daily. Patient endorsed alcohol use socially.  Patient denies SI/HI and AVH at this moment.  Affect is constricted.  Does appear to be internally preoccupied with latent thought process.  Insight impaired, judgment fair. Patient is not currently with an outpatient mental health provider but is open to a referral prior to discharge only if telehealth. Recommendations include: crisis stabilization, therapeutic milieu, encourage group attendance and participation, medication management for mood stabilization and development of comprehensive mental wellness/sobriety plan.  Lowry Ram. 01/10/2024

## 2024-01-10 NOTE — BH IP Treatment Plan (Signed)
 Interdisciplinary Treatment and Diagnostic Plan Update  01/10/2024 Time of Session: 1:54 AM Shawn Nicholson MRN: [id number]  Principal Diagnosis: Acute psychosis [facility])  Secondary Diagnoses: Principal Problem:   Acute psychosis (HCC) Active Problems:   Cannabis abuse   Non compliance w medication regimen   Current Medications:  Current Facility-Administered Medications  Medication Dose Route Frequency Provider Last Rate Last Admin   acetaminophen (TYLENOL) tablet 650 mg  650 mg Oral Q6H PRN Tingling, Stephanie, PA-C       alum & mag hydroxide-simeth (MAALOX/MYLANTA) 200-200-20 MG/5ML suspension 30 mL  30 mL Oral Q4H PRN Tingling, Stephanie, PA-C       haloperidol (HALDOL) tablet 5 mg  5 mg Oral TID PRN Tingling, Stephanie, PA-C       And   diphenhydrAMINE (BENADRYL) capsule 50 mg  50 mg Oral TID PRN Tingling, Stephanie, PA-C       haloperidol lactate (HALDOL) injection 5 mg  5 mg Intramuscular TID PRN Tingling, Stephanie, PA-C       And   diphenhydrAMINE (BENADRYL) injection 50 mg  50 mg Intramuscular TID PRN Tingling, Stephanie, PA-C       And   LORazepam (ATIVAN) injection 2 mg  2 mg Intramuscular TID PRN Tingling, Stephanie, PA-C       haloperidol lactate (HALDOL) injection 10 mg  10 mg Intramuscular TID PRN Tingling, Stephanie, PA-C       And   diphenhydrAMINE (BENADRYL) injection 50 mg  50 mg Intramuscular TID PRN Tingling, Stephanie, PA-C       And   LORazepam (ATIVAN) injection 2 mg  2 mg Intramuscular TID PRN Tingling, Stephanie, PA-C       divalproex (DEPAKOTE ER) 24 hr tablet 1,000 mg  1,000 mg Oral QHS Tingling, Stephanie, PA-C   1,000 mg at 01/09/24 2112   hydrOXYzine (ATARAX) tablet 25 mg  25 mg Oral TID PRN Tingling, Stephanie, PA-C   25 mg at 01/10/24 9562   magnesium hydroxide (MILK OF MAGNESIA) suspension 30 mL  30 mL Oral Daily PRN Tingling, Stephanie, PA-C       OLANZapine (ZYPREXA) tablet 10 mg  10 mg Oral QHS Tingling, Stephanie, PA-C       traZODone (DESYREL)  tablet 50 mg  50 mg Oral QHS PRN Tingling, Stephanie, PA-C       PTA Medications: Medications Prior to Admission  Medication Sig Dispense Refill Last Dose/Taking   divalproex (DEPAKOTE ER) 500 MG 24 hr tablet Take 2 tablets (1,000 mg total) by mouth at bedtime. (Patient not taking: Reported on 01/09/2024) 60 tablet 1    hydrOXYzine (ATARAX) 25 MG tablet Take 1 tablet (25 mg total) by mouth 3 (three) times daily as needed for anxiety. (Patient not taking: Reported on 01/09/2024) 75 tablet 2    OLANZapine (ZYPREXA) 5 MG tablet Take 1 tablet (5 mg total) by mouth at bedtime for 6 days, THEN 2 tablets (10 mg total) at bedtime. (Patient not taking: Reported on 01/09/2024) 60 tablet 0    traZODone (DESYREL) 50 MG tablet Take 1 tablet (50 mg total) by mouth at bedtime as needed for sleep. (Patient not taking: Reported on 01/09/2024) 30 tablet 2     Patient Stressors: Medication change or noncompliance    Patient Strengths: Physical Health  Supportive family/friends   Treatment Modalities: Medication Management, Group therapy, Case management,  1 to 1 session with clinician, Psychoeducation, Recreational therapy.   Physician Treatment Plan for Primary Diagnosis: Acute psychosis (HCC) Long Term Goal(s):  Short Term Goals:    Medication Management: Evaluate patient's response, side effects, and tolerance of medication regimen.  Therapeutic Interventions: 1 to 1 sessions, Unit Group sessions and Medication administration.  Evaluation of Outcomes: Not Met  Physician Treatment Plan for Secondary Diagnosis: Principal Problem:   Acute psychosis (HCC) Active Problems:   Cannabis abuse   Non compliance w medication regimen  Long Term Goal(s):     Short Term Goals:       Medication Management: Evaluate patient's response, side effects, and tolerance of medication regimen.  Therapeutic Interventions: 1 to 1 sessions, Unit Group sessions and Medication administration.  Evaluation of Outcomes:  Not Met   RN Treatment Plan for Primary Diagnosis: Acute psychosis (HCC) Long Term Goal(s): Knowledge of disease and therapeutic regimen to maintain health will improve  Short Term Goals: Ability to verbalize frustration and anger appropriately will improve, Ability to demonstrate self-control, Ability to participate in decision making will improve, Ability to verbalize feelings will improve, Ability to disclose and discuss suicidal ideas, Ability to identify and develop effective coping behaviors will improve, and Compliance with prescribed medications will improve  Medication Management: RN will administer medications as ordered by provider, will assess and evaluate patient's response and provide education to patient for prescribed medication. RN will report any adverse and/or side effects to prescribing provider.  Therapeutic Interventions: 1 on 1 counseling sessions, Psychoeducation, Medication administration, Evaluate responses to treatment, Monitor vital signs and CBGs as ordered, Perform/monitor CIWA, COWS, AIMS and Fall Risk screenings as ordered, Perform wound care treatments as ordered.  Evaluation of Outcomes: Not Met   LCSW Treatment Plan for Primary Diagnosis: Acute psychosis (HCC) Long Term Goal(s): Safe transition to appropriate next level of care at discharge, Engage patient in therapeutic group addressing interpersonal concerns.  Short Term Goals: Engage patient in aftercare planning with referrals and resources, Increase social support, Increase ability to appropriately verbalize feelings, Increase emotional regulation, Facilitate acceptance of mental health diagnosis and concerns, Facilitate patient progression through stages of change regarding substance use diagnoses and concerns, Identify triggers associated with mental health/substance abuse issues, and Increase skills for wellness and recovery  Therapeutic Interventions: Assess for all discharge needs, 1 to 1 time with  Social worker, Explore available resources and support systems, Assess for adequacy in community support network, Educate family and significant other(s) on suicide prevention, Complete Psychosocial Assessment, Interpersonal group therapy.  Evaluation of Outcomes: Not Met   Progress in Treatment: Attending groups: No. Participating in groups: No. Taking medication as prescribed: Yes. Toleration medication: Yes. Family/Significant other contact made: Yes, individual(s) contacted:  Darrold Junker, [phone], mother Patient understands diagnosis: Yes. Discussing patient identified problems/goals with staff: Yes. Medical problems stabilized or resolved: Yes. and No. Denies suicidal/homicidal ideation: Yes. Issues/concerns per patient self-inventory: No. Other: None  New problem(s) identified: No, Describe:  None  New Short Term/Long Term Goal(s):detox, elimination of symptoms of psychosis, medication management for mood stabilization; elimination of SI thoughts; development of comprehensive mental wellness/sobriety plan.    Patient Goals:  "To overcome overthinking and maintain a balanced nutrition."  Discharge Plan or Barriers: CSW to assist in the development of appropriate discharge plan.   Reason for Continuation of Hospitalization: Aggression Anxiety Delusions  Depression Hallucinations [place] Medical Issues Medication stabilization Suicidal ideation  Estimated Length of Stay: 1-7 days.   Last 3 [place] Suicide Severity Risk Score: Flowsheet Row Admission (Current) from 01/09/2024 in [facility] Most recent reading at 01/09/2024  3:00 PM ED from 01/08/2024 in [place]  [facility] Most recent reading at 01/08/2024 10:20 PM Office Visit from 01/08/2024 in [facility] Most recent reading at 01/08/2024  8:29 AM  C-SSRS RISK CATEGORY No Risk Moderate Risk Moderate Risk       Last PHQ 2/9 Scores:     01/08/2024    8:26 AM 01/07/2024   12:18 PM 12/18/2021    8:55 AM  Depression screen PHQ 2/9  Decreased Interest 0 2 0  Down, Depressed, Hopeless 3 1 0  PHQ - 2 Score 3 3 0  Altered sleeping 2 3   Tired, decreased energy 2 3   Change in appetite 2 3   Feeling bad or failure about yourself  3 1   Trouble concentrating 3 3   Moving slowly or fidgety/restless 2 3   Suicidal thoughts 0 0   PHQ-9 Score 17 19   Difficult doing work/chores Somewhat difficult Very difficult     Scribe for Treatment Team: Lowry Ram, LCSW 01/10/2024 2:28 PM

## 2024-01-10 NOTE — [facility] Suicide Risk Assessment (Addendum)
 [facility] INPATIENT:  Family/Significant Other Suicide Prevention Education  Suicide Prevention Education:  Contact Attempts: Darrold Junker, [phone], mother; has been identified by the patient as the family member/significant other with whom the patient will be residing, and identified as the person(s) who will aid the patient in the event of a mental health crisis.  With written consent from the patient, two attempts were made to provide suicide prevention education, prior to and/or following the patient's discharge.  We were unsuccessful in providing suicide prevention education.  A suicide education pamphlet was given to the patient to share with family/significant other.  Date and time of first attempt:01/10/24 at 1:31 PM.   Date and time of second attempt:  Second attempt is needed   CSW left HIPAA compliant voicemail.  Lowry Ram 01/10/2024, 1:30 PM

## 2024-01-10 NOTE — [facility] Counselor (Signed)
 CSW touched base with patient's mother Darrold Junker at [phone] to complete suicide prevention education.   During conversation, patient's mother stated that when medicated, the patient is typically "normal," but when off medication, he becomes "all over the place" and easily distracted. She reported that he has been off his medication for four months. Over the past three weeks, she described their experience as a "struggle," and the patient remained "stable but unable to focus." Mother reports that this week, the patient "derailed." The mother explained that the patient is usually compliant with his medication and responds well to her guidance. She emphasized that he is "always able to come home" and confirmed that she will provide transportation upon discharge. Additionally, she shared that she works in the mental health field and is able to redirect him as needed. She confirmed that the patient is not a danger to himself or others, has no direct access to weapons, and has a strong support system in place.   CSW team to continue to assess and evaluate to place for safe discharge.    Reymundo Poll, MSW, LCSWA 01/10/2024 2:22 PM

## 2024-01-10 NOTE — Group Note (Signed)
 Recreation Therapy Group Note   Group Topic:Other  Group Date: 01/10/2024 Start Time: 1500 End Time: 1550 Facilitators: Rosina Lowenstein, LRT, CTRS Location: Courtyard  Group Description: [employer]. LRT and patients played games of basketball, drew with chalk, and played corn hole while outside in the courtyard while getting fresh air and sunlight. Music was being played in the background. LRT and peers conversed about different games they have played before, what they do in their free time and anything else that is on their minds. LRT encouraged pts to drink water after being outside, sweating and getting their heart rate up.  Goal Area(s) Addressed: Patient will build on frustration tolerance skills. Patients will partake in a competitive play game with peers. Patients will gain knowledge of new leisure interest/hobby.    Affect/Mood: Flat   Participation Level: Non-verbal   Participation Quality: Independent   Behavior: Calm   Speech/Thought Process: N/A   Insight: Limited   Judgement: Fair    Modes of Intervention: Activity   Patient Response to Interventions:  Receptive   Education Outcome:  In group clarification offered    Clinical Observations/Individualized Feedback: Rosie was somewhat active in their participation of session activities and group discussion. Pt was present outside in the courtyard, however did not interact with LRT or peers.    Plan: Continue to engage patient in RT group sessions 2-3x/week.   Rosina Lowenstein, LRT, CTRS 01/10/2024 4:42 PM

## 2024-01-10 NOTE — Plan of Care (Signed)
   Problem: Education: Goal: Knowledge of [place] [facility] Education information/materials will improve Outcome: Progressing Goal: Emotional status will improve Outcome: Progressing

## 2024-01-10 NOTE — Progress Notes (Signed)
   01/10/24 1600  Psych Admission Type (Psych Patients Only)  Admission Status Voluntary  Psychosocial Assessment  Patient Complaints None  Eye Contact Brief  Facial Expression Flat  Affect Flat  Speech Soft  Interaction Minimal  Motor Activity Slow  Appearance/Hygiene Unremarkable  Behavior Characteristics Guarded;Cooperative  Mood Preoccupied  Aggressive Behavior  Effect No apparent injury  Thought Process  Coherency Disorganized  Content Preoccupation  Delusions None reported or observed  Perception UTA  Hallucination None reported or observed  Judgment Impaired  Confusion Moderate  Danger to Self  Current suicidal ideation? Denies (Denies)  Agreement Not to Harm Self Yes  Description of Agreement Verbal  Danger to Others  Danger to Others None reported or observed

## 2024-01-10 NOTE — Plan of Care (Signed)
   Problem: Education: Goal: Emotional status will improve Outcome: Progressing Goal: Mental status will improve Outcome: Progressing

## 2024-01-10 NOTE — Plan of Care (Signed)
   Problem: Education: Goal: Knowledge of [place] [facility] Education information/materials will improve Outcome: Progressing Goal: Emotional status will improve Outcome: Progressing Goal: Mental status will improve Outcome: Progressing

## 2024-01-10 NOTE — [facility] Suicide Risk Assessment (Signed)
 [facility] INPATIENT:  Family/Significant Other Suicide Prevention Education  Suicide Prevention Education:  Education Completed; Darrold Junker, [phone], mother,  has been identified by the patient as the family member/significant other with whom the patient will be residing, and identified as the person(s) who will aid the patient in the event of a mental health crisis (suicidal ideations/suicide attempt).  With written consent from the patient, the family member/significant other has been provided the following suicide prevention education, prior to the and/or following the discharge of the patient.  The suicide prevention education provided includes the following: Suicide risk factors Suicide prevention and interventions National Suicide Hotline telephone number [facility] assessment telephone number [facility] Emergency Assistance 911 [facility] and/or Residential Mobile Crisis Unit telephone number  Request made of family/significant other to: Remove weapons (e.g., guns, rifles, knives), all items previously/currently identified as safety concern.   Remove drugs/medications (over-the-counter, prescriptions, illicit drugs), all items previously/currently identified as a safety concern.  The patient's mother stated that when medicated, the patient is typically "normal," but when off medication, he becomes "all over the place" and easily distracted. She reported that he has been off his medication for four months. Over the past three weeks, she described their experience as a "struggle," and the patient remained "stable but unable to focus." Mother reports that this week, the patient "derailed." The mother explained that the patient is usually compliant with his medication and responds well to her guidance. She emphasized that he is "always able to come home" and confirmed that she will provide transportation upon discharge. Additionally, she shared that she works in the  mental health field and is able to redirect him as needed. She confirmed that the patient is not a danger to himself or others, has no direct access to weapons, and has a strong support system in place.    Lowry Ram 01/10/2024, 2:19 PM

## 2024-01-11 DIAGNOSIS — F23 Brief psychotic disorder: Secondary | ICD-10-CM | POA: Diagnosis not present

## 2024-01-11 MED ORDER — OLANZAPINE 5 MG PO TABS
15.0000 mg | ORAL_TABLET | Freq: Every day | ORAL | Status: DC
  Administered 2024-01-11: 15 mg via ORAL
  Filled 2024-01-11: qty 1

## 2024-01-11 NOTE — Progress Notes (Signed)
   01/10/24 2014  Psych Admission Type (Psych Patients Only)  Admission Status Voluntary  Psychosocial Assessment  Patient Complaints None  Eye Contact Fair  Facial Expression Flat  Affect Flat  Speech Soft  Interaction Minimal  Motor Activity Slow  Appearance/Hygiene Unremarkable  Behavior Characteristics Cooperative  Mood Preoccupied  Aggressive Behavior  Effect No apparent injury  Thought Process  Coherency Disorganized  Content Preoccupation  Delusions None reported or observed  Perception UTA  Hallucination UTA  Judgment Impaired  Confusion Moderate  Danger to Self  Current suicidal ideation?  ([place])  Agreement Not to Harm Self Yes

## 2024-01-11 NOTE — Group Note (Signed)
 Date:  01/11/2024 Time:  10:30 PM  Group Topic/Focus:  Self Care:   The focus of this group is to help patients understand the importance of self-care in order to improve or restore emotional, physical, spiritual, interpersonal, and financial health.    Participation Level:  Did Not Attend  Participation Quality:    Affect:    Cognitive:    Insight:   Engagement in Group:    Modes of Intervention:    Additional Comments:    Jerred Zaremba 01/11/2024, 10:30 PM

## 2024-01-11 NOTE — Progress Notes (Signed)
   01/11/24 1008  Psych Admission Type (Psych Patients Only)  Admission Status Voluntary  Psychosocial Assessment  Patient Complaints None  Eye Contact Fair  Facial Expression Flat  Affect Flat  Speech Slow  Interaction Minimal  Motor Activity Slow  Appearance/Hygiene In scrubs  Behavior Characteristics Cooperative  Mood Preoccupied  Thought Process  Coherency Disorganized  Content Preoccupation  Delusions None reported or observed  Perception Derealization  Hallucination None reported or observed  Judgment Impaired  Confusion Moderate  Danger to Self  Current suicidal ideation? Denies  Agreement Not to Harm Self Yes  Description of Agreement verbal  Danger to Others  Danger to Others None reported or observed

## 2024-01-11 NOTE — Progress Notes (Signed)
 [facility] MD Progress Note  01/11/2024 9:59 AM Shawn Nicholson  MRN:  [id number]   43 yr old never married [place] American male with reported hx of schizoaffective disorder, bipolar type, cannabis abuse, non-adherence with med regimen presented to [facility] voluntarily accompanied by his mother on 01/08/2024 with chief complaints of abnormal behaviors, auditory and visual hallucinations, confusion, difficulties sleeping, concentrating, irritability. Reportedly was off his meds for about 4 months per chart review. UDS positive for THC.    Subjective:   Chart reviewed, case discussed with multidisciplinary team, pt seen for reassessment. No behavioral issues reported overnight.  Nurses reports that he has been isolated to his room.  His mother has been calling the unit to speak with him however if she does not have his security code, patient was notified and stated that he was not calling her.  Per chart review patient did attend a group session yesterday, and was somewhat participatory.  On reevaluation, patient states that he is doing all right.  Reports good sleep overnight and good appetite.  Reports that he continues to feel as if it is difficult for him to process information while communicating with others.  States he continues to experience auditory hallucinations, noncommand, however this has improved.  Also reports that last heard these voices this morning, as well as had visual hallucination.  He denies any depression or anxiety.  Denies any medication side effects.  Denies SI/HI.  Affect is constricted, somewhat guarded, but blunted.  Insight impaired and judgment fair.   Principal Problem: Schizoaffective disorder, bipolar type (HCC) Diagnosis: Principal Problem:   Schizoaffective disorder, bipolar type (HCC) Active Problems:   Acute psychosis (HCC)   Cannabis abuse   Non compliance w medication regimen  Total Time spent with patient: 20 minutes  Past Psychiatric History:  Per chart review,  schizoaffective disorder, bipolar type  Patient denies outpatient provider in years. However on chart review, [facility]  Current caregiver:  Patient is own guardian/ care giver Past hospitalizations: reported by pt Awilda Metro at the age of 38/37, [facility] in [place] 3 yrs ago; per chart review, [facility] on 11/01/21 and 11/13/2018.  Medication trials : pt unable to recall, per chart review Zyprexa and Depakote  Suicide attempts: at 43 yrs old, attempted to OD on "pills", no hospitalization Patient denies ever having an Act/CST team. Denies ECT, Clozaril treatments.  Past Medical History:  Past Medical History:  Diagnosis Date   Allergy    Anxiety    no meds   Boxer's fracture    RLF   Depression    Substance abuse (HCC)     Past Surgical History:  Procedure Laterality Date   CLOSED REDUCTION METACARPAL WITH PERCUTANEOUS PINNING  10/23/2012   Procedure: CLOSED REDUCTION METACARPAL WITH PERCUTANEOUS PINNING;  Surgeon: Tami Ribas, MD;  Location: [facility];  Service: Orthopedics;  Laterality: Right;   OPEN REDUCTION INTERNAL FIXATION (ORIF) METACARPAL  10/23/2012   Procedure: OPEN REDUCTION INTERNAL FIXATION (ORIF) METACARPAL;  Surgeon: Tami Ribas, MD;  Location: [facility];  Service: Orthopedics;;  RIGHT SMALL AND RING FINGER ORIF VERSUS CLOSED REDUCTION PERCUTANEOUS PINNING   Family History:  Family History  Problem Relation Age of Onset   Hypertension Father    Arthritis Mother    Arthritis Other    Hypertension Other    Cancer Neg Hx    Alcohol abuse Neg Hx    Depression Neg Hx  Diabetes Neg Hx    Drug abuse Neg Hx    Early death Neg Hx    Heart disease Neg Hx    Hyperlipidemia Neg Hx    Kidney disease Neg Hx    Stroke Neg Hx    Family Psychiatric  History: denies Social History:  Social History   Substance and Sexual Activity  Alcohol Use Yes    Alcohol/week: 3.0 standard drinks of alcohol   Types: 3 Cans of beer per week   Comment: social     Social History   Substance and Sexual Activity  Drug Use Yes   Frequency: 7.0 times per week   Types: Marijuana   Comment: daily, wrapped in his cigars, abt 1 gram daily    Social History   Socioeconomic History   Marital status: Single    Spouse name: Not on file   Number of children: 0   Years of education: Not on file   Highest education level: Some college, no degree  Occupational History   Not on file  Tobacco Use   Smoking status: Light Smoker    Types: Cigarettes   Smokeless tobacco: Never  Vaping Use   Vaping status: Never Used  Substance and Sexual Activity   Alcohol use: Yes    Alcohol/week: 3.0 standard drinks of alcohol    Types: 3 Cans of beer per week    Comment: social   Drug use: Yes    Frequency: 7.0 times per week    Types: Marijuana    Comment: daily, wrapped in his cigars, abt 1 gram daily   Sexual activity: Not Currently  Other Topics Concern   Not on file  Social History Narrative   Lives at home with his mother    Social Drivers of Corporate investment banker Strain: Not on file  Food Insecurity: No Food Insecurity (01/09/2024)   Hunger Vital Sign    Worried About Running Out of Food in the Last Year: Never true    Ran Out of Food in the Last Year: Never true  Transportation Needs: No Transportation Needs (01/09/2024)   PRAPARE - Administrator, Civil Service (Medical): No    Lack of Transportation (Non-Medical): No  Physical Activity: Not on file  Stress: Not on file  Social Connections: Not on file   Additional Social History:                         Sleep: Good  Appetite:  Good  Current Medications: Current Facility-Administered Medications  Medication Dose Route Frequency Provider Last Rate Last Admin   acetaminophen (TYLENOL) tablet 650 mg  650 mg Oral Q6H PRN Jerica Creegan, PA-C       alum & mag  hydroxide-simeth (MAALOX/MYLANTA) 200-200-20 MG/5ML suspension 30 mL  30 mL Oral Q4H PRN Kaeya Schiffer, PA-C       haloperidol (HALDOL) tablet 5 mg  5 mg Oral TID PRN Chandlor Noecker, PA-C       And   diphenhydrAMINE (BENADRYL) capsule 50 mg  50 mg Oral TID PRN Kitara Hebb, PA-C       haloperidol lactate (HALDOL) injection 5 mg  5 mg Intramuscular TID PRN Mattie Nordell, PA-C       And   diphenhydrAMINE (BENADRYL) injection 50 mg  50 mg Intramuscular TID PRN Khaiden Segreto, PA-C       And   LORazepam (ATIVAN) injection 2 mg  2 mg Intramuscular TID PRN  Hussam Muniz, PA-C       haloperidol lactate (HALDOL) injection 10 mg  10 mg Intramuscular TID PRN Jacie Tristan, PA-C       And   diphenhydrAMINE (BENADRYL) injection 50 mg  50 mg Intramuscular TID PRN Charlissa Petros, PA-C       And   LORazepam (ATIVAN) injection 2 mg  2 mg Intramuscular TID PRN Ormand Senn, PA-C       divalproex (DEPAKOTE ER) 24 hr tablet 1,000 mg  1,000 mg Oral QHS Thales Knipple, PA-C   1,000 mg at 01/10/24 2142   hydrOXYzine (ATARAX) tablet 25 mg  25 mg Oral TID PRN Daisuke Bailey, PA-C   25 mg at 01/10/24 0855   magnesium hydroxide (MILK OF MAGNESIA) suspension 30 mL  30 mL Oral Daily PRN Vannessa Godown, PA-C       OLANZapine (ZYPREXA) tablet 10 mg  10 mg Oral QHS Yashas Camilli, PA-C   10 mg at 01/10/24 2142   traZODone (DESYREL) tablet 50 mg  50 mg Oral QHS PRN Kailon Treese, Judeth Cornfield, PA-C        Lab Results:  Results for orders placed or performed during the hospital encounter of 01/09/24 (from the past 48 hours)  Lipid panel     Status: Abnormal   Collection Time: 01/09/24  1:57 PM  Result Value Ref Range   Cholesterol 127 0 - 200 mg/dL   Triglycerides 956 <213 mg/dL   HDL 29 (L) >08 mg/dL   Total CHOL/HDL Ratio 4.4 RATIO   VLDL 20 0 - 40 mg/dL   LDL Cholesterol 78 0 - 99 mg/dL    Comment:        Total Cholesterol/HDL:CHD Risk Coronary  Heart Disease Risk Table                     Men   Women  1/2 Average Risk   3.4   3.3  Average Risk       5.0   4.4  2 X Average Risk   9.6   7.1  3 X Average Risk  23.4   11.0        Use the calculated Patient Ratio above and the CHD Risk Table to determine the patient's CHD Risk.        ATP III CLASSIFICATION (LDL):  <100     mg/dL   Optimal  657-846  mg/dL   Near or Above                    Optimal  130-159  mg/dL   Borderline  962-952  mg/dL   High  >841     mg/dL   Very High Performed at [facility], [address] [place], [place] [address]   Hemoglobin A1c     Status: None   Collection Time: 01/09/24  1:57 PM  Result Value Ref Range   Hgb A1c MFr Bld 5.4 4.8 - 5.6 %    Comment: (NOTE) Pre diabetes:          5.7%-6.4%  Diabetes:              >6.4%  Glycemic control for   <7.0% adults with diabetes    Mean Plasma Glucose 108.28 mg/dL    Comment: Performed at [facility], [address], [place], [place] [address]    Blood Alcohol level:  Lab Results  Component Value Date   ETH <10 01/08/2024   ETH <10  10/30/2021    Metabolic Disorder Labs: Lab Results  Component Value Date   HGBA1C 5.4 01/09/2024   MPG 108.28 01/09/2024   MPG 93.93 10/30/2021   No results found for: "PROLACTIN" Lab Results  Component Value Date   CHOL 127 01/09/2024   TRIG 102 01/09/2024   HDL 29 (L) 01/09/2024   CHOLHDL 4.4 01/09/2024   VLDL 20 01/09/2024   LDLCALC 78 01/09/2024   LDLCALC 87 10/30/2021    Physical Findings: AIMS:  , ,  ,  ,    CIWA:    COWS:     Musculoskeletal: Strength & Muscle Tone: within normal limits Gait & Station: normal Patient leans: N/A  Psychiatric Specialty Exam:  Presentation  General Appearance:  Appropriate for Environment  Eye Contact: Fleeting  Speech: Other (comment) (latent)  Speech Volume: Decreased  Handedness: Right   Mood and Affect  Mood: Euthymic  Affect: Appropriate;  Constricted   Thought Process  Thought Processes: Other (comment) (thought blocking)  Descriptions of Associations:Intact  Orientation:Full (Time, Place and Person)  Thought Content:Scattered  History of Schizophrenia/Schizoaffective disorder:Yes  Duration of Psychotic Symptoms:Less than six months  Hallucinations:No data recorded  Ideas of Reference:None  Suicidal Thoughts:No data recorded  Homicidal Thoughts:No data recorded   Sensorium  Memory: Immediate Good  Judgment: Fair  Insight: Fair   Chartered certified accountant: Fair  Attention Span: Fair  Recall: [employer] of Knowledge: Fair  Language: Fair   Psychomotor Activity  Psychomotor Activity: No data recorded   Assets  Assets: Desire for Improvement; Financial Resources/Insurance; Housing; Physical Health; Social Lawyer; Other (comment) (employed)   Sleep  Sleep: No data recorded    Physical Exam: Physical Exam Vitals and nursing note reviewed.  Constitutional:      Appearance: Normal appearance.  HENT:     Head: Normocephalic and atraumatic.     Nose: Nose normal.     Mouth/Throat:     Mouth: Mucous membranes are moist.  Eyes:     Extraocular Movements: Extraocular movements intact.  Pulmonary:     Effort: Pulmonary effort is normal.  Musculoskeletal:        General: Normal range of motion.     Cervical back: Normal range of motion.  Skin:    General: Skin is warm and dry.  Neurological:     General: No focal deficit present.     Mental Status: He is alert and oriented to person, place, and time.  Psychiatric:        Attention and Perception: He is inattentive. He perceives auditory hallucinations.        Mood and Affect: Mood and affect normal.        Speech: Speech is delayed.        Behavior: Behavior is slowed. Behavior is cooperative.        Thought Content: Thought content normal.        Cognition and Memory: Cognition normal. He  exhibits impaired recent memory.     Comments: Judgement fair , thought blocking    Review of Systems  Psychiatric/Behavioral:  Positive for hallucinations. The patient is nervous/anxious.   All other systems reviewed and are negative.  Blood pressure 121/71, pulse 99, temperature 98.6 F (37 C), resp. rate (!) 22, height 6\' 5"  (1.956 m), weight 94.6 kg, SpO2 95%. Body mass index is 24.72 kg/m.   Treatment Plan Summary:  1.    Safety and Monitoring:   --  Voluntary admission to inpatient psychiatric unit  for safety, stabilization and treatment -- Daily contact with patient to assess and evaluate symptoms and progress in treatment -- Patient's case to be discussed in multi-disciplinary team meeting -- Observation Level : q15 minute checks -- Vital signs:  q12 hours -- Precautions: none   2. Psychiatric Diagnoses and Treatment:   01/11/2024  -- Continue Depakote ER 1000 mg HS for mood stabilization   -- Increase  Zyprexa to 15 mg HS for psychosis  -- Continue trazodone 50 mg at bedtime as needed for sleep -- Continue Vistaril 25 mg 3 times daily as needed for anxiety  01/10/2024  -- Continue Depakote ER 1000 mg HS for mood stabilization   -- Increase  Zyprexa to 10 mg HS for psychosis  01/09/2024  -- Continue Depakote ER 1000 mg HS for mood stabilization   -- Continue  Zyprexa 5 mg HS for psychosis  -- Continue Gabapentin 100mg  PO BID PRN anxiety  -- Start mirtazapine 7.5mg  PO at bedtime for MDD and target associated anxiety, insomnia   --  The risks/benefits/side-effects/alternatives to this medication were discussed in detail with the patient and time was given for questions. The patient consents to medication trial.  -- Metabolic profile and EKG monitoring obtained while on an atypical antipsychotic  (BMI: 24.72; Lipid Panel: WNL;  HbgA1c: 5.4; QTc: 442)  -- Encouraged patient to participate in unit milieu and in scheduled group therapies  -- Short Term Goals: Ability to  identify changes in lifestyle to reduce recurrence of condition will improve, Ability to verbalize feelings will improve, Ability to disclose and discuss suicidal ideas, Ability to demonstrate self-control will improve, Ability to identify and develop effective coping behaviors will improve, Ability to maintain clinical measurements within normal limits will improve, Compliance with prescribed medications will improve, and Ability to identify triggers associated with substance abuse/mental health issues will improve -- Long Term Goals: Improvement in symptoms so as ready for discharge        3. Medical Issues Being Addressed: Marijuana abuse             --Counseled on risks of continued use               Tobacco use             -- encouraged smoking cessation, counseled    4. Discharge Planning:   -- Social work and case management to assist with discharge planning and identification of hospital follow-up needs prior to discharge -- Estimated LOS: 5-7 days -- Discharge Concerns: Need to establish a safety plan; Medication compliance and effectiveness -- Discharge Goals: Return home with outpatient referrals for mental health follow-up including medication management/psychotherapy  Paulene Floor, PA-C 01/11/2024, 9:59 AM

## 2024-01-11 NOTE — Group Note (Signed)
 Date:  01/11/2024 Time:  5:17 PM  Group Topic/Focus:  Activity Group: The focus of the group is to promote activity for the patients and encourage them to go outside to the courtyard to receive some exercise and some fresh air.    Participation Level:  Did Not Attend  Participation Quality:    Affect:    Cognitive:    Insight:   Engagement in Group:    Modes of Intervention:    Additional Comments:    Mary Sella Eber Ferrufino 01/11/2024, 5:17 PM

## 2024-01-11 NOTE — Group Note (Signed)
 Date:  01/11/2024 Time:  10:42 AM  Group Topic/Focus:  Music Therapy: The purpose of this group is to allow patients to enjoy soothing music of their choice.    Participation Level:  Did Not Attend  Participation Quality:    Affect:    Cognitive:    Insight:   Engagement in Group:    Modes of Intervention:    Additional Comments:  Did not attend   Yanni Ruberg T Kali Deadwyler 01/11/2024, 10:42 AM

## 2024-01-12 DIAGNOSIS — F250 Schizoaffective disorder, bipolar type: Secondary | ICD-10-CM | POA: Diagnosis not present

## 2024-01-12 MED ORDER — OLANZAPINE 10 MG PO TABS
20.0000 mg | ORAL_TABLET | Freq: Every day | ORAL | Status: DC
  Administered 2024-01-12 – 2024-01-14 (×3): 20 mg via ORAL
  Filled 2024-01-12 (×3): qty 2

## 2024-01-12 NOTE — Progress Notes (Signed)
   01/12/24 1000  Psych Admission Type (Psych Patients Only)  Admission Status Voluntary  Psychosocial Assessment  Patient Complaints None  Eye Contact Fair  Facial Expression Flat  Affect Flat  Speech Soft  Interaction Minimal  Motor Activity Slow  Appearance/Hygiene Unremarkable  Behavior Characteristics Cooperative  Mood Preoccupied  Aggressive Behavior  Effect No apparent injury  Thought Process  Coherency Disorganized  Content Preoccupation  Delusions None reported or observed  Perception WDL  Hallucination None reported or observed  Judgment Impaired  Confusion Mild  Danger to Self  Current suicidal ideation? Denies  Agreement Not to Harm Self Yes  Description of Agreement verbal  Danger to Others  Danger to Others None reported or observed

## 2024-01-12 NOTE — Group Note (Signed)
 Date:  01/12/2024 Time:  8:53 PM  Group Topic/Focus:  Wrap-Up Group:   The focus of this group is to help patients review their daily goal of treatment and discuss progress on daily workbooks.    Participation Level:  Active  Participation Quality:  Appropriate  Affect:  Appropriate  Cognitive:  Appropriate  Insight: Appropriate  Engagement in Group:  Engaged  Modes of Intervention:  Discussion    Lenore Cordia 01/12/2024, 8:53 PM

## 2024-01-12 NOTE — Progress Notes (Signed)
 Pt calm and pleasant during assessment denying SI/HI. Pt endorses AVH but state that the voices were getting better. Pt observed by this Clinical research associate interacting appropriately with staff and peers on the unit. Pt compliant with medication administration per MD orders. Pt given education, support, and encouragement to be active in his treatment plan. Pt being monitored Q 15 minutes for safety per unit protocol, remains safe on the unit

## 2024-01-12 NOTE — Group Note (Signed)
 Date:  01/12/2024 Time:  12:09 PM  Group Topic/Focus:  Coping With Mental Health Crisis:   The purpose of this group is to help patients identify strategies for coping with mental health crisis.  Group discusses possible causes of crisis and ways to manage them effectively.    Participation Level:  Did Not Attend   Shawn Nicholson 01/12/2024, 12:09 PM

## 2024-01-12 NOTE — Plan of Care (Signed)
   Problem: Education: Goal: Knowledge of [place] [facility] Education information/materials will improve Outcome: Progressing Goal: Emotional status will improve Outcome: Progressing Goal: Mental status will improve Outcome: Progressing

## 2024-01-12 NOTE — Progress Notes (Signed)
 [facility] MD Progress Note  01/12/2024 11:44 AM Shawn Nicholson  MRN:  [id number]   42 yr old never married [place] American male with reported hx of schizoaffective disorder, bipolar type, cannabis abuse, non-adherence with med regimen presented to [facility] voluntarily accompanied by his mother on 01/08/2024 with chief complaints of abnormal behaviors, auditory and visual hallucinations, confusion, difficulties sleeping, concentrating, irritability. Reportedly was off his meds for about 4 months per chart review. UDS positive for THC.    Subjective:   Chart reviewed, case discussed with multidisciplinary team, pt seen for reassessment. No behavioral issues reported overnight.  Nurses reports that he has been isolated to his room, has not been observed RTIS.  Patient is seen for reassessment, reports that he is doing all right.  Does state that he had some difficulty sleeping overnight because he was worried about returning to work at discharge.  Reports good appetite.  Denies any depressed mood, anxiety.  Does report some improvement in auditory hallucinations " they are better", not as intense, reports last hearing of voices " a little while ago".  He does appear to be internally preoccupied at times, some difficulties continue with processing information.  Reports that he did not go to any groups yesterday, did encourage him to attend, he is agreeable.  Reports that he contact his mother yesterday but he was not able to get in contact with her, that he was a little upset with her because she wanted him to come here to the hospital for treatment, however he is aware that she was just concerned.  He is denying SI/HI and current AVH.  Denies any medication side effects.  principal Problem: Schizoaffective disorder, bipolar type (HCC) Diagnosis: Principal Problem:   Schizoaffective disorder, bipolar type (HCC) Active Problems:   Acute psychosis (HCC)   Cannabis abuse   Non compliance w medication  regimen  Total Time spent with patient: 20 minutes  Past Psychiatric History:  Per chart review, schizoaffective disorder, bipolar type  Patient denies outpatient provider in years. However on chart review, [facility]  Current caregiver:  Patient is own guardian/ care giver Past hospitalizations: reported by pt Awilda Metro at the age of [age], [facility] in [place] 3 yrs ago; per chart review, [facility] on 11/01/21 and 11/13/2018.  Medication trials : pt unable to recall, per chart review Zyprexa and Depakote  Suicide attempts: at 43 yrs old, attempted to OD on "pills", no hospitalization Patient denies ever having an Act/CST team. Denies ECT, Clozaril treatments.  Past Medical History:  Past Medical History:  Diagnosis Date   Allergy    Anxiety    no meds   Boxer's fracture    RLF   Depression    Substance abuse (HCC)     Past Surgical History:  Procedure Laterality Date   CLOSED REDUCTION METACARPAL WITH PERCUTANEOUS PINNING  10/23/2012   Procedure: CLOSED REDUCTION METACARPAL WITH PERCUTANEOUS PINNING;  Surgeon: Tami Ribas, MD;  Location: [facility];  Service: Orthopedics;  Laterality: Right;   OPEN REDUCTION INTERNAL FIXATION (ORIF) METACARPAL  10/23/2012   Procedure: OPEN REDUCTION INTERNAL FIXATION (ORIF) METACARPAL;  Surgeon: Tami Ribas, MD;  Location: [facility];  Service: Orthopedics;;  RIGHT SMALL AND RING FINGER ORIF VERSUS CLOSED REDUCTION PERCUTANEOUS PINNING   Family History:  Family History  Problem Relation Age of Onset   Hypertension Father    Arthritis Mother    Arthritis Other  Hypertension Other    Cancer Neg Hx    Alcohol abuse Neg Hx    Depression Neg Hx    Diabetes Neg Hx    Drug abuse Neg Hx    Early death Neg Hx    Heart disease Neg Hx    Hyperlipidemia Neg Hx    Kidney disease Neg Hx    Stroke Neg Hx    Family Psychiatric   History: denies Social History:  Social History   Substance and Sexual Activity  Alcohol Use Yes   Alcohol/week: 3.0 standard drinks of alcohol   Types: 3 Cans of beer per week   Comment: social     Social History   Substance and Sexual Activity  Drug Use Yes   Frequency: 7.0 times per week   Types: Marijuana   Comment: daily, wrapped in his cigars, abt 1 gram daily    Social History   Socioeconomic History   Marital status: Single    Spouse name: Not on file   Number of children: 0   Years of education: Not on file   Highest education level: Some college, no degree  Occupational History   Not on file  Tobacco Use   Smoking status: Light Smoker    Types: Cigarettes   Smokeless tobacco: Never  Vaping Use   Vaping status: Never Used  Substance and Sexual Activity   Alcohol use: Yes    Alcohol/week: 3.0 standard drinks of alcohol    Types: 3 Cans of beer per week    Comment: social   Drug use: Yes    Frequency: 7.0 times per week    Types: Marijuana    Comment: daily, wrapped in his cigars, abt 1 gram daily   Sexual activity: Not Currently  Other Topics Concern   Not on file  Social History Narrative   Lives at home with his mother    Social Drivers of Corporate investment banker Strain: Not on file  Food Insecurity: No Food Insecurity (01/09/2024)   Hunger Vital Sign    Worried About Running Out of Food in the Last Year: Never true    Ran Out of Food in the Last Year: Never true  Transportation Needs: No Transportation Needs (01/09/2024)   PRAPARE - Administrator, Civil Service (Medical): No    Lack of Transportation (Non-Medical): No  Physical Activity: Not on file  Stress: Not on file  Social Connections: Not on file   Additional Social History:                         Sleep: Good  Appetite:  Good  Current Medications: Current Facility-Administered Medications  Medication Dose Route Frequency Provider Last Rate Last Admin    acetaminophen (TYLENOL) tablet 650 mg  650 mg Oral Q6H PRN Laakea Pereira, PA-C       alum & mag hydroxide-simeth (MAALOX/MYLANTA) 200-200-20 MG/5ML suspension 30 mL  30 mL Oral Q4H PRN Judiann Celia, PA-C       haloperidol (HALDOL) tablet 5 mg  5 mg Oral TID PRN Kaitelyn Jamison, PA-C       And   diphenhydrAMINE (BENADRYL) capsule 50 mg  50 mg Oral TID PRN Terrel Nesheiwat, PA-C       haloperidol lactate (HALDOL) injection 5 mg  5 mg Intramuscular TID PRN Kayelee Herbig, PA-C       And   diphenhydrAMINE (BENADRYL) injection 50 mg  50 mg Intramuscular TID  PRN Kary Sugrue, Judeth Cornfield, PA-C       And   LORazepam (ATIVAN) injection 2 mg  2 mg Intramuscular TID PRN Wenceslaus Gist, PA-C       haloperidol lactate (HALDOL) injection 10 mg  10 mg Intramuscular TID PRN Tekeisha Hakim, PA-C       And   diphenhydrAMINE (BENADRYL) injection 50 mg  50 mg Intramuscular TID PRN Lehman Whiteley, PA-C       And   LORazepam (ATIVAN) injection 2 mg  2 mg Intramuscular TID PRN Nikki Glanzer, PA-C       divalproex (DEPAKOTE ER) 24 hr tablet 1,000 mg  1,000 mg Oral QHS Uldine Fuster, PA-C   1,000 mg at 01/11/24 2137   hydrOXYzine (ATARAX) tablet 25 mg  25 mg Oral TID PRN Bexleigh Theriault, PA-C   25 mg at 01/10/24 0855   magnesium hydroxide (MILK OF MAGNESIA) suspension 30 mL  30 mL Oral Daily PRN Leonda Cristo, PA-C       OLANZapine (ZYPREXA) tablet 15 mg  15 mg Oral QHS Terryn Redner, PA-C   15 mg at 01/11/24 2137   traZODone (DESYREL) tablet 50 mg  50 mg Oral QHS PRN Liadan Guizar, PA-C   50 mg at 01/11/24 2137    Lab Results:  No results found for this or any previous visit (from the past 48 hours).   Blood Alcohol level:  Lab Results  Component Value Date   ETH <10 01/08/2024   ETH <10 10/30/2021    Metabolic Disorder Labs: Lab Results  Component Value Date   HGBA1C 5.4 01/09/2024   MPG 108.28 01/09/2024   MPG 93.93 10/30/2021    No results found for: "PROLACTIN" Lab Results  Component Value Date   CHOL 127 01/09/2024   TRIG 102 01/09/2024   HDL 29 (L) 01/09/2024   CHOLHDL 4.4 01/09/2024   VLDL 20 01/09/2024   LDLCALC 78 01/09/2024   LDLCALC 87 10/30/2021    Physical Findings: AIMS:  , ,  ,  ,    CIWA:    COWS:     Musculoskeletal: Strength & Muscle Tone: within normal limits Gait & Station: normal Patient leans: N/A  Psychiatric Specialty Exam:  Presentation  General Appearance:  Appropriate for Environment  Eye Contact: Fleeting  Speech: Other (comment) (latent)  Speech Volume: Decreased  Handedness: Right   Mood and Affect  Mood: Euthymic  Affect: Appropriate; Constricted   Thought Process  Thought Processes: Other (comment) (thought blocking)  Descriptions of Associations:Intact  Orientation:Full (Time, Place and Person)  Thought Content:Scattered  History of Schizophrenia/Schizoaffective disorder:Yes  Duration of Psychotic Symptoms:Less than six months  Hallucinations:No data recorded  Ideas of Reference:None  Suicidal Thoughts:No data recorded  Homicidal Thoughts:No data recorded   Sensorium  Memory: Immediate Good  Judgment: Fair  Insight: Fair   Chartered certified accountant: Fair  Attention Span: Fair  Recall: [employer] of Knowledge: Fair  Language: Fair   Psychomotor Activity  Psychomotor Activity: No data recorded   Assets  Assets: Desire for Improvement; Financial Resources/Insurance; Housing; Physical Health; Social Lawyer; Other (comment) (employed)   Sleep  Sleep: No data recorded    Physical Exam: Physical Exam Vitals and nursing note reviewed.  Constitutional:      Appearance: Normal appearance.  HENT:     Head: Normocephalic and atraumatic.     Nose: Nose normal.     Mouth/Throat:     Mouth: Mucous membranes are moist.  Eyes:  Extraocular Movements: Extraocular  movements intact.  Pulmonary:     Effort: Pulmonary effort is normal.  Musculoskeletal:        General: Normal range of motion.     Cervical back: Normal range of motion.  Skin:    General: Skin is warm and dry.  Neurological:     General: No focal deficit present.     Mental Status: He is alert and oriented to person, place, and time.  Psychiatric:        Attention and Perception: He is inattentive. He perceives auditory hallucinations.        Mood and Affect: Mood and affect normal.        Speech: Speech is delayed.        Behavior: Behavior is slowed. Behavior is cooperative.        Thought Content: Thought content normal.        Cognition and Memory: Cognition normal. He exhibits impaired recent memory.     Comments: Judgement fair , thought blocking    Review of Systems  Psychiatric/Behavioral:  Positive for hallucinations. The patient is nervous/anxious.   All other systems reviewed and are negative.  Blood pressure (!) 159/87, pulse 92, temperature 98 F (36.7 C), resp. rate 18, height 6\' 5"  (1.956 m), weight 94.6 kg, SpO2 100%. Body mass index is 24.72 kg/m.   Treatment Plan Summary:  1.    Safety and Monitoring:   --  Voluntary admission to inpatient psychiatric unit for safety, stabilization and treatment -- Daily contact with patient to assess and evaluate symptoms and progress in treatment -- Patient's case to be discussed in multi-disciplinary team meeting -- Observation Level : q15 minute checks -- Vital signs:  q12 hours -- Precautions: none   2. Psychiatric Diagnoses and Treatment:   01/12/2024  -- Continue Depakote ER 1000 mg HS for mood stabilization   -- Increase  Zyprexa to 20 mg HS for psychosis/sleep  -- Continue trazodone 50 mg at bedtime as needed for sleep -- Continue Vistaril 25 mg 3 times daily as needed for anxiety  01/11/2024  -- Continue Depakote ER 1000 mg HS for mood stabilization   -- Increase  Zyprexa to 15 mg HS for psychosis  --  Continue trazodone 50 mg at bedtime as needed for sleep -- Continue Vistaril 25 mg 3 times daily as needed for anxiety  01/10/2024  -- Continue Depakote ER 1000 mg HS for mood stabilization   -- Increase  Zyprexa to 10 mg HS for psychosis  01/09/2024  -- Continue Depakote ER 1000 mg HS for mood stabilization   -- Continue  Zyprexa 5 mg HS for psychosis  -- Continue Gabapentin 100mg  PO BID PRN anxiety  -- Start mirtazapine 7.5mg  PO at bedtime for MDD and target associated anxiety, insomnia   --  The risks/benefits/side-effects/alternatives to this medication were discussed in detail with the patient and time was given for questions. The patient consents to medication trial.  -- Metabolic profile and EKG monitoring obtained while on an atypical antipsychotic  (BMI: 24.72; Lipid Panel: WNL;  HbgA1c: 5.4; QTc: 442)  -- Encouraged patient to participate in unit milieu and in scheduled group therapies  -- Short Term Goals: Ability to identify changes in lifestyle to reduce recurrence of condition will improve, Ability to verbalize feelings will improve, Ability to disclose and discuss suicidal ideas, Ability to demonstrate self-control will improve, Ability to identify and develop effective coping behaviors will improve, Ability to maintain clinical measurements within normal  limits will improve, Compliance with prescribed medications will improve, and Ability to identify triggers associated with substance abuse/mental health issues will improve -- Long Term Goals: Improvement in symptoms so as ready for discharge        3. Medical Issues Being Addressed: Marijuana abuse             --Counseled on risks of continued use               Tobacco use             -- encouraged smoking cessation, counseled    4. Discharge Planning:   -- Social work and case management to assist with discharge planning and identification of hospital follow-up needs prior to discharge -- Estimated LOS: 5-7 days --  Discharge Concerns: Need to establish a safety plan; Medication compliance and effectiveness -- Discharge Goals: Return home with outpatient referrals for mental health follow-up including medication management/psychotherapy  Paulene Floor, PA-C 01/12/2024, 11:44 AM

## 2024-01-13 DIAGNOSIS — F250 Schizoaffective disorder, bipolar type: Secondary | ICD-10-CM | POA: Diagnosis not present

## 2024-01-13 NOTE — Group Note (Signed)
 Recreation Therapy Group Note   Group Topic:Goal Setting  Group Date: 01/13/2024 Start Time: 1040 End Time: 1140 Facilitators: Rosina Lowenstein, LRT, CTRS Location:  Craft Room  Group Description: Product/process development scientist. Patients were given many different magazines, a glue stick, markers, and a piece of cardstock paper. LRT and pts discussed the importance of having goals in life. LRT and pts discussed the difference between short-term and long-term goals, as well as what a SMART goal is. LRT encouraged pts to create a vision board, with images they picked and then cut out with safety scissors from the magazine, for themselves, that capture their short and long-term goals. LRT encouraged pts to show and explain their vision board to the group.   Goal Area(s) Addressed:  Patient will gain knowledge of short vs. long term goals.  Patient will identify goals for themselves. Patient will practice setting SMART goals. Patient will verbalize their goals to LRT and peers.   Affect/Mood: N/A   Participation Level: Did not attend    Clinical Observations/Individualized Feedback: Patient did not attend group.   Plan: Continue to engage patient in RT group sessions 2-3x/week.   Rosina Lowenstein, LRT, CTRS 01/13/2024 1:00 PM

## 2024-01-13 NOTE — Progress Notes (Signed)
   01/13/24 2300  Psych Admission Type (Psych Patients Only)  Admission Status Voluntary  Psychosocial Assessment  Patient Complaints None  Eye Contact Brief  Facial Expression Flat  Affect Flat  Speech Soft  Interaction Isolative;Minimal  Motor Activity Slow  Appearance/Hygiene In scrubs  Behavior Characteristics Cooperative  Mood Pleasant  Thought Process  Coherency Disorganized  Content Preoccupation  Delusions None reported or observed  Perception WDL  Hallucination None reported or observed  Judgment Impaired  Confusion Mild  Danger to Self  Current suicidal ideation? Denies  Agreement Not to Harm Self Yes  Description of Agreement verbal  Danger to Others  Danger to Others None reported or observed

## 2024-01-13 NOTE — Plan of Care (Signed)

## 2024-01-13 NOTE — Group Note (Signed)
 Date:  01/13/2024 Time:  11:34 AM  Group Topic/Focus:  Goals Group:   The focus of this group is to help patients establish daily goals to achieve during treatment and discuss how the patient can incorporate goal setting into their daily lives to aide in recovery.  Participation Level:  Did Not Attend  Shawn Nicholson A Shawn Nicholson 01/13/2024, 11:34 AM

## 2024-01-13 NOTE — Group Note (Signed)
 Date:  01/13/2024 Time:  8:39 PM  Group Topic/Focus:  Wrap-Up Group:   The focus of this group is to help patients review their daily goal of treatment and discuss progress on daily workbooks.    Participation Level:  Active  Participation Quality:  Appropriate and Attentive  Affect:  Appropriate  Cognitive:  Appropriate  Insight: Appropriate  Engagement in Group:  Supportive  Modes of Intervention:  Discussion  Additional Comments:     Belva Crome 01/13/2024, 8:39 PM

## 2024-01-13 NOTE — Plan of Care (Signed)
  Problem: Education: Goal: Knowledge of [facility] Education information/materials will improve Outcome: Progressing Goal: Emotional status will improve Outcome: Progressing Goal: Mental status will improve Outcome: Progressing   Problem: Coping: Goal: Ability to verbalize frustrations and anger appropriately will improve Outcome: Progressing   Problem: Safety: Goal: Periods of time without injury will increase Outcome: Progressing

## 2024-01-13 NOTE — Progress Notes (Signed)
 [facility] MD Progress Note  01/13/2024 11:00 AM Margaret SOTA HETZ  MRN:  [id number] Subjective:  43 year old African American male, does not report any current problems but is observed responding to internal stimuli in the milieu. He participates in group sessions but remains generally isolative.Continues to show signs of responding to internal stimuli despite no overt hallucinations or delusions reported.  Principal Problem: Schizoaffective disorder, bipolar type (HCC) Diagnosis: Principal Problem:   Schizoaffective disorder, bipolar type (HCC) Active Problems:   Acute psychosis (HCC)   Cannabis abuse   Non compliance w medication regimen  Total Time spent with patient: 1 hour  Past Psychiatric History: see below  Past Medical History:  Past Medical History:  Diagnosis Date   Allergy    Anxiety    no meds   Boxer's fracture    RLF   Depression    Substance abuse (HCC)     Past Surgical History:  Procedure Laterality Date   CLOSED REDUCTION METACARPAL WITH PERCUTANEOUS PINNING  10/23/2012   Procedure: CLOSED REDUCTION METACARPAL WITH PERCUTANEOUS PINNING;  Surgeon: Tami Ribas, MD;  Location: [facility];  Service: Orthopedics;  Laterality: Right;   OPEN REDUCTION INTERNAL FIXATION (ORIF) METACARPAL  10/23/2012   Procedure: OPEN REDUCTION INTERNAL FIXATION (ORIF) METACARPAL;  Surgeon: Tami Ribas, MD;  Location: [facility];  Service: Orthopedics;;  RIGHT SMALL AND RING FINGER ORIF VERSUS CLOSED REDUCTION PERCUTANEOUS PINNING   Family History:  Family History  Problem Relation Age of Onset   Hypertension Father    Arthritis Mother    Arthritis Other    Hypertension Other    Cancer Neg Hx    Alcohol abuse Neg Hx    Depression Neg Hx    Diabetes Neg Hx    Drug abuse Neg Hx    Early death Neg Hx    Heart disease Neg Hx    Hyperlipidemia Neg Hx    Kidney disease Neg Hx    Stroke Neg Hx    Family Psychiatric  History: see above Social History:   Social History   Substance and Sexual Activity  Alcohol Use Yes   Alcohol/week: 3.0 standard drinks of alcohol   Types: 3 Cans of beer per week   Comment: social     Social History   Substance and Sexual Activity  Drug Use Yes   Frequency: 7.0 times per week   Types: Marijuana   Comment: daily, wrapped in his cigars, abt 1 gram daily    Social History   Socioeconomic History   Marital status: Single    Spouse name: Not on file   Number of children: 0   Years of education: Not on file   Highest education level: Some college, no degree  Occupational History   Not on file  Tobacco Use   Smoking status: Light Smoker    Types: Cigarettes   Smokeless tobacco: Never  Vaping Use   Vaping status: Never Used  Substance and Sexual Activity   Alcohol use: Yes    Alcohol/week: 3.0 standard drinks of alcohol    Types: 3 Cans of beer per week    Comment: social   Drug use: Yes    Frequency: 7.0 times per week    Types: Marijuana    Comment: daily, wrapped in his cigars, abt 1 gram daily   Sexual activity: Not Currently  Other Topics Concern   Not on file  Social History Narrative   Lives at home with his mother  Social Drivers of Corporate investment banker Strain: Not on file  Food Insecurity: No Food Insecurity (01/09/2024)   Hunger Vital Sign    Worried About Running Out of Food in the Last Year: Never true    Ran Out of Food in the Last Year: Never true  Transportation Needs: No Transportation Needs (01/09/2024)   PRAPARE - Administrator, Civil Service (Medical): No    Lack of Transportation (Non-Medical): No  Physical Activity: Not on file  Stress: Not on file  Social Connections: Not on file   Additional Social History:                         Sleep: Good  Appetite:  Good  Current Medications: Current Facility-Administered Medications  Medication Dose Route Frequency Provider Last Rate Last Admin   acetaminophen (TYLENOL) tablet  650 mg  650 mg Oral Q6H PRN Tingling, Stephanie, PA-C       alum & mag hydroxide-simeth (MAALOX/MYLANTA) 200-200-20 MG/5ML suspension 30 mL  30 mL Oral Q4H PRN Tingling, Stephanie, PA-C       haloperidol (HALDOL) tablet 5 mg  5 mg Oral TID PRN Tingling, Stephanie, PA-C       And   diphenhydrAMINE (BENADRYL) capsule 50 mg  50 mg Oral TID PRN Tingling, Stephanie, PA-C       haloperidol lactate (HALDOL) injection 5 mg  5 mg Intramuscular TID PRN Tingling, Stephanie, PA-C       And   diphenhydrAMINE (BENADRYL) injection 50 mg  50 mg Intramuscular TID PRN Tingling, Stephanie, PA-C       And   LORazepam (ATIVAN) injection 2 mg  2 mg Intramuscular TID PRN Tingling, Stephanie, PA-C       haloperidol lactate (HALDOL) injection 10 mg  10 mg Intramuscular TID PRN Tingling, Stephanie, PA-C       And   diphenhydrAMINE (BENADRYL) injection 50 mg  50 mg Intramuscular TID PRN Tingling, Stephanie, PA-C       And   LORazepam (ATIVAN) injection 2 mg  2 mg Intramuscular TID PRN Tingling, Stephanie, PA-C       divalproex (DEPAKOTE ER) 24 hr tablet 1,000 mg  1,000 mg Oral QHS Tingling, Stephanie, PA-C   1,000 mg at 01/13/24 2103   hydrOXYzine (ATARAX) tablet 25 mg  25 mg Oral TID PRN Tingling, Stephanie, PA-C   25 mg at 01/10/24 0855   magnesium hydroxide (MILK OF MAGNESIA) suspension 30 mL  30 mL Oral Daily PRN Tingling, Stephanie, PA-C       OLANZapine (ZYPREXA) tablet 20 mg  20 mg Oral QHS Tingling, Stephanie, PA-C   20 mg at 01/13/24 2103   traZODone (DESYREL) tablet 50 mg  50 mg Oral QHS PRN Tingling, Stephanie, PA-C   50 mg at 01/13/24 2103    Lab Results: No results found for this or any previous visit (from the past 48 hours).  Blood Alcohol level:  Lab Results  Component Value Date   ETH <10 01/08/2024   ETH <10 10/30/2021    Metabolic Disorder Labs: Lab Results  Component Value Date   HGBA1C 5.4 01/09/2024   MPG 108.28 01/09/2024   MPG 93.93 10/30/2021   No results found for:  "PROLACTIN" Lab Results  Component Value Date   CHOL 127 01/09/2024   TRIG 102 01/09/2024   HDL 29 (L) 01/09/2024   CHOLHDL 4.4 01/09/2024   VLDL 20 01/09/2024   LDLCALC 78 01/09/2024  LDLCALC 87 10/30/2021    Physical Findings: AIMS:  , ,  ,  ,    CIWA:    COWS:     Musculoskeletal: Strength & Muscle Tone: within normal limits Gait & Station: normal Patient leans: N/A  Psychiatric Specialty Exam:  Presentation  General Appearance:  Appropriate for Environment  Eye Contact: Minimal  Speech: Clear and Coherent; Normal Rate  Speech Volume: Normal (soft)  Handedness: Right   Mood and Affect  Mood: Euthymic  Affect: Depressed; Congruent   Thought Process  Thought Processes: Disorganized  Descriptions of Associations:Intact  Orientation:Partial  Thought Content:-- (Preoccupied.)  History of Schizophrenia/Schizoaffective disorder:Yes  Duration of Psychotic Symptoms:Greater than six months  Hallucinations:Hallucinations: None Description of Auditory Hallucinations: denies Description of Visual Hallucinations: denies  Ideas of Reference:None  Suicidal Thoughts:Suicidal Thoughts: No  Homicidal Thoughts:Homicidal Thoughts: No   Sensorium  Memory: Immediate Fair; Recent Fair; Remote Fair  Judgment: Fair  Insight: Fair   Art therapist  Concentration: Good  Attention Span: Fair  Recall: Fair  Fund of Knowledge: Good  Language: Good   Psychomotor Activity  Psychomotor Activity:Psychomotor Activity: Normal   Assets  Assets: Social Support; Communication Skills   Sleep  Sleep:Sleep: Good Number of Hours of Sleep: 8    Physical Exam: Physical Exam Vitals and nursing note reviewed.  Constitutional:      Appearance: Normal appearance.  HENT:     Head: Normocephalic and atraumatic.     Nose: Nose normal.  Pulmonary:     Effort: Pulmonary effort is normal.  Musculoskeletal:        General: Normal range  of motion.     Cervical back: Normal range of motion.  Neurological:     General: No focal deficit present.     Mental Status: He is alert and oriented to person, place, and time. Mental status is at baseline.  Psychiatric:        Attention and Perception: Attention and perception normal.        Mood and Affect: Mood is anxious. Affect is flat.        Speech: Speech is delayed.        Behavior: Behavior is withdrawn. Behavior is cooperative.        Thought Content: Thought content normal.        Cognition and Memory: Cognition and memory normal.        Judgment: Judgment is impulsive.    Review of Systems  Psychiatric/Behavioral:  The patient is nervous/anxious.   All other systems reviewed and are negative.  Blood pressure 126/82, pulse 76, temperature 98.8 F (37.1 C), resp. rate 20, height 6\' 5"  (1.956 m), weight 94.6 kg, SpO2 100%. Body mass index is 24.72 kg/m.   Treatment Plan Summary: Daily contact with patient to assess and evaluate symptoms and progress in treatment and Medication management Depakote ER 1000 mg HS for mood stabilization. Zyprexa to 20 mg HS for psychosis and sleep regulation. Trazodone 50 mg at bedtime PRN for sleep disturbances. Continue to monitor response to medication changes, particularly the increased Zyprexa dose for psychosis/sleep. Encouraged participation in group therapy to improve social engagement. Assess for changes in internal stimuli response and evaluate cognitive clarity over time. Reinforce safety measures with continued observation and psychiatric evaluation. Myriam Forehand, NP 01/13/2024, 11:02 PM

## 2024-01-13 NOTE — Progress Notes (Signed)
   01/13/24 0926  Psych Admission Type (Psych Patients Only)  Admission Status Voluntary  Psychosocial Assessment  Patient Complaints None  Eye Contact Brief  Facial Expression Flat  Affect Flat  Speech Soft  Interaction Isolative;Minimal  Motor Activity Slow  Appearance/Hygiene In scrubs  Behavior Characteristics Cooperative  Mood Pleasant  Thought Process  Coherency Disorganized  Content Preoccupation  Delusions None reported or observed  Perception WDL  Hallucination None reported or observed  Judgment Impaired  Confusion Mild  Danger to Self  Current suicidal ideation? Denies  Agreement Not to Harm Self Yes  Description of Agreement verbal  Danger to Others  Danger to Others None reported or observed

## 2024-01-13 NOTE — Group Note (Signed)
 LCSW Group Therapy Note   Group Date: 01/13/2024 Start Time: 1300 End Time: 1418   Type of Therapy and Topic:  Group Therapy: Challenging Core Beliefs  Participation Level:  Did Not Attend  Description of Group:  Patients were educated about core beliefs and asked to identify one harmful core belief that they have. Patients were asked to explore from where those beliefs originate. Patients were asked to discuss how those beliefs make them feel and the resulting behaviors of those beliefs. They were then be asked if those beliefs are true and, if so, what evidence they have to support them. Lastly, group members were challenged to replace those negative core beliefs with helpful beliefs.   Therapeutic Goals:   1. Patient will identify harmful core beliefs and explore the origins of such beliefs. 2. Patient will identify feelings and behaviors that result from those core beliefs. 3. Patient will discuss whether such beliefs are true. 4.  Patient will replace harmful core beliefs with helpful ones.  Summary of Patient Progress:  Patient did not attend.   Therapeutic Modalities: Cognitive Behavioral Therapy; Solution-Focused Therapy   Lowry Ram, LCSWA 01/13/2024  2:18 PM

## 2024-01-14 DIAGNOSIS — F250 Schizoaffective disorder, bipolar type: Secondary | ICD-10-CM | POA: Diagnosis not present

## 2024-01-14 MED ORDER — DIVALPROEX SODIUM ER 500 MG PO TB24
750.0000 mg | ORAL_TABLET | Freq: Two times a day (BID) | ORAL | Status: DC
  Administered 2024-01-14 – 2024-01-15 (×2): 750 mg via ORAL
  Filled 2024-01-14 (×3): qty 1

## 2024-01-14 NOTE — Group Note (Signed)
 Date:  01/14/2024 Time:  4:43 PM  Group Topic/Focus:  Wellness Toolbox:   The focus of this group is to discuss various aspects of wellness, balancing those aspects and exploring ways to increase the ability to experience wellness.  Patients will create a wellness toolbox for use upon discharge.    Participation Level:  Did Not Attend   Lynelle Smoke [facility] 01/14/2024, 4:43 PM

## 2024-01-14 NOTE — Progress Notes (Signed)
 [facility] MD Progress Note  01/14/2024 6:45 PM Torrez SHAY BARTOLI  MRN:  [id number] Subjective:  : 43 year old African male,presents with no reported complaints but appears withdrawn and does not participate in group activities. He maintains minimal interaction and demonstrates an isolative behavior pattern. Despite his withdrawal, his mood appears pleasant, though his affect remains flat. Principal Problem: Schizoaffective disorder, bipolar type (HCC) Diagnosis: Principal Problem:   Schizoaffective disorder, bipolar type (HCC) Active Problems:   Acute psychosis (HCC)   Cannabis abuse   Non compliance w medication regimen  Total Time spent with patient: 1.5 hours  Past Psychiatric History: see below  Past Medical History:  Past Medical History:  Diagnosis Date   Allergy    Anxiety    no meds   Boxer's fracture    RLF   Depression    Substance abuse (HCC)     Past Surgical History:  Procedure Laterality Date   CLOSED REDUCTION METACARPAL WITH PERCUTANEOUS PINNING  10/23/2012   Procedure: CLOSED REDUCTION METACARPAL WITH PERCUTANEOUS PINNING;  Surgeon: Tami Ribas, MD;  Location: [facility];  Service: Orthopedics;  Laterality: Right;   OPEN REDUCTION INTERNAL FIXATION (ORIF) METACARPAL  10/23/2012   Procedure: OPEN REDUCTION INTERNAL FIXATION (ORIF) METACARPAL;  Surgeon: Tami Ribas, MD;  Location: [facility];  Service: Orthopedics;;  RIGHT SMALL AND RING FINGER ORIF VERSUS CLOSED REDUCTION PERCUTANEOUS PINNING   Family History:  Family History  Problem Relation Age of Onset   Hypertension Father    Arthritis Mother    Arthritis Other    Hypertension Other    Cancer Neg Hx    Alcohol abuse Neg Hx    Depression Neg Hx    Diabetes Neg Hx    Drug abuse Neg Hx    Early death Neg Hx    Heart disease Neg Hx    Hyperlipidemia Neg Hx    Kidney disease Neg Hx    Stroke Neg Hx    Family Psychiatric  History: see above Social History:  Social History    Substance and Sexual Activity  Alcohol Use Yes   Alcohol/week: 3.0 standard drinks of alcohol   Types: 3 Cans of beer per week   Comment: social     Social History   Substance and Sexual Activity  Drug Use Yes   Frequency: 7.0 times per week   Types: Marijuana   Comment: daily, wrapped in his cigars, abt 1 gram daily    Social History   Socioeconomic History   Marital status: Single    Spouse name: Not on file   Number of children: 0   Years of education: Not on file   Highest education level: Some college, no degree  Occupational History   Not on file  Tobacco Use   Smoking status: Light Smoker    Types: Cigarettes   Smokeless tobacco: Never  Vaping Use   Vaping status: Never Used  Substance and Sexual Activity   Alcohol use: Yes    Alcohol/week: 3.0 standard drinks of alcohol    Types: 3 Cans of beer per week    Comment: social   Drug use: Yes    Frequency: 7.0 times per week    Types: Marijuana    Comment: daily, wrapped in his cigars, abt 1 gram daily   Sexual activity: Not Currently  Other Topics Concern   Not on file  Social History Narrative   Lives at home with his mother    Social Drivers  of Health   Financial Resource Strain: Not on file  Food Insecurity: No Food Insecurity (01/09/2024)   Hunger Vital Sign    Worried About Running Out of Food in the Last Year: Never true    Ran Out of Food in the Last Year: Never true  Transportation Needs: No Transportation Needs (01/09/2024)   PRAPARE - Administrator, Civil Service (Medical): No    Lack of Transportation (Non-Medical): No  Physical Activity: Not on file  Stress: Not on file  Social Connections: Not on file   Additional Social History:                         Sleep: Good  Appetite:  Good  Current Medications: Current Facility-Administered Medications  Medication Dose Route Frequency Provider Last Rate Last Admin   acetaminophen (TYLENOL) tablet 650 mg  650 mg  Oral Q6H PRN Tingling, Stephanie, PA-C       alum & mag hydroxide-simeth (MAALOX/MYLANTA) 200-200-20 MG/5ML suspension 30 mL  30 mL Oral Q4H PRN Tingling, Stephanie, PA-C       haloperidol (HALDOL) tablet 5 mg  5 mg Oral TID PRN Tingling, Stephanie, PA-C       And   diphenhydrAMINE (BENADRYL) capsule 50 mg  50 mg Oral TID PRN Tingling, Stephanie, PA-C       haloperidol lactate (HALDOL) injection 5 mg  5 mg Intramuscular TID PRN Tingling, Stephanie, PA-C       And   diphenhydrAMINE (BENADRYL) injection 50 mg  50 mg Intramuscular TID PRN Tingling, Stephanie, PA-C       And   LORazepam (ATIVAN) injection 2 mg  2 mg Intramuscular TID PRN Tingling, Stephanie, PA-C       haloperidol lactate (HALDOL) injection 10 mg  10 mg Intramuscular TID PRN Tingling, Stephanie, PA-C       And   diphenhydrAMINE (BENADRYL) injection 50 mg  50 mg Intramuscular TID PRN Tingling, Stephanie, PA-C       And   LORazepam (ATIVAN) injection 2 mg  2 mg Intramuscular TID PRN Tingling, Stephanie, PA-C       divalproex (DEPAKOTE ER) 24 hr tablet 750 mg  750 mg Oral BID Myriam Forehand, NP   750 mg at 01/14/24 1817   hydrOXYzine (ATARAX) tablet 25 mg  25 mg Oral TID PRN Tingling, Stephanie, PA-C   25 mg at 01/10/24 0855   magnesium hydroxide (MILK OF MAGNESIA) suspension 30 mL  30 mL Oral Daily PRN Tingling, Stephanie, PA-C       OLANZapine (ZYPREXA) tablet 20 mg  20 mg Oral QHS Tingling, Stephanie, PA-C   20 mg at 01/13/24 2103   traZODone (DESYREL) tablet 50 mg  50 mg Oral QHS PRN Tingling, Stephanie, PA-C   50 mg at 01/13/24 2103    Lab Results: No results found for this or any previous visit (from the past 48 hours).  Blood Alcohol level:  Lab Results  Component Value Date   ETH <10 01/08/2024   ETH <10 10/30/2021    Metabolic Disorder Labs: Lab Results  Component Value Date   HGBA1C 5.4 01/09/2024   MPG 108.28 01/09/2024   MPG 93.93 10/30/2021   No results found for: "PROLACTIN" Lab Results  Component Value  Date   CHOL 127 01/09/2024   TRIG 102 01/09/2024   HDL 29 (L) 01/09/2024   CHOLHDL 4.4 01/09/2024   VLDL 20 01/09/2024   LDLCALC 78 01/09/2024  LDLCALC 87 10/30/2021    Physical Findings: AIMS:  , ,  ,  ,    CIWA:    COWS:     Musculoskeletal: Strength & Muscle Tone: within normal limits Gait & Station: normal Patient leans: N/A  Psychiatric Specialty Exam:  Presentation  General Appearance:  Appropriate for Environment  Eye Contact: Good  Speech: Clear and Coherent; Normal Rate  Speech Volume: Decreased (soft)  Handedness: Right   Mood and Affect  Mood: -- (Neutral)  Affect: Flat; Blunt   Thought Process  Thought Processes: Disorganized  Descriptions of Associations:Intact  Orientation:Partial  Thought Content:-- (Preoccupied)  History of Schizophrenia/Schizoaffective disorder:Yes  Duration of Psychotic Symptoms:Greater than six months  Hallucinations:Hallucinations: None Description of Auditory Hallucinations: responding to internal stimuli Description of Visual Hallucinations: denies  Ideas of Reference:None  Suicidal Thoughts:Suicidal Thoughts: No  Homicidal Thoughts:Homicidal Thoughts: No   Sensorium  Memory: Immediate Fair; Remote Fair; Recent Fair  Judgment: Poor  Insight: Poor   Executive Functions  Concentration: Fair  Attention Span: Fair  Recall: Fair  Fund of Knowledge: Good  Language: Good   Psychomotor Activity  Psychomotor Activity: Psychomotor Activity: Normal   Assets  Assets: Communication Skills; Social Support   Sleep  Sleep: Sleep: Good Number of Hours of Sleep: 6    Physical Exam: Physical Exam Vitals and nursing note reviewed.  Constitutional:      Appearance: Normal appearance.  HENT:     Head: Normocephalic and atraumatic.     Nose: Nose normal.  Pulmonary:     Effort: Pulmonary effort is normal.  Musculoskeletal:        General: Normal range of motion.      Cervical back: Normal range of motion.  Neurological:     General: No focal deficit present.     Mental Status: He is alert. Mental status is at baseline.  Psychiatric:        Attention and Perception: Perception normal. He is inattentive.        Mood and Affect: Mood is anxious. Affect is blunt and flat.        Speech: Speech normal.        Behavior: Behavior normal. Behavior is cooperative.        Thought Content: Thought content normal.        Cognition and Memory: Memory normal. Cognition is impaired.        Judgment: Judgment normal.    Review of Systems  Psychiatric/Behavioral:  Positive for depression.   All other systems reviewed and are negative.  Blood pressure 113/60, pulse 81, temperature 98.3 F (36.8 C), resp. rate 20, height 6\' 5"  (1.956 m), weight 94.6 kg, SpO2 99%. Body mass index is 24.72 kg/m.   Treatment Plan Summary: Daily contact with patient to assess and evaluate symptoms and progress in treatment and Medication management Depakote ER 1000 mg HS for mood stabilization. Zyprexa to 20 mg HS for psychosis and sleep regulation. Trazodone 50 mg at bedtime PRN for sleep disturbances. Continue to monitor response to medication changes, particularly the increased Zyprexa dose for psychosis/sleep. Encouraged participation in group therapy to improve social engagement. Assess for changes in internal stimuli response and evaluate cognitive clarity over time. Reinforce safety measures with continued observation and psychiatric evaluation. Myriam Forehand, NP 01/14/2024, 6:45 PM

## 2024-01-14 NOTE — Group Note (Signed)
 Date:  01/14/2024 Time:  10:01 AM  Group Topic/Focus:   Goals Group:   The focus of this group is to help patients establish daily goals to achieve during treatment and discuss how the patient can incorporate goal setting into their daily lives to aide in recovery.  Overcoming Stress:   The focus of this group is to define stress and help patients assess their triggers.   Participation Level:  Active  Participation Quality:  Appropriate  Affect:  Appropriate  Cognitive:  Appropriate  Insight: Appropriate  Engagement in Group:  Engaged  Modes of Intervention:  Activity, Discussion, and Education  Additional Comments:    Shawn Nicholson A Shawn Nicholson 01/14/2024, 10:01 AM

## 2024-01-14 NOTE — Group Note (Signed)
 Date:  01/14/2024 Time:  8:37 PM  Group Topic/Focus:  Wrap-Up Group:   The focus of this group is to help patients review their daily goal of treatment and discuss progress on daily workbooks.    Participation Level:  Active  Participation Quality:  Attentive  Affect:  Appropriate and Flat  Cognitive:  Alert  Insight: Good  Engagement in Group:  Limited  Modes of Intervention:  Discussion and Education  Additional Comments:     Maglione,Sharece Fleischhacker E 01/14/2024, 8:37 PM

## 2024-01-14 NOTE — Progress Notes (Signed)
   01/14/24 2200  Psych Admission Type (Psych Patients Only)  Admission Status Voluntary  Psychosocial Assessment  Patient Complaints None  Eye Contact Fair  Facial Expression Flat  Affect Flat  Speech Soft  Interaction Guarded;Minimal  Motor Activity Slow  Appearance/Hygiene In scrubs  Behavior Characteristics Cooperative  Mood Pleasant  Thought Process  Coherency Disorganized  Content Preoccupation  Delusions None reported or observed  Perception WDL  Hallucination None reported or observed  Judgment Poor  Confusion Mild  Danger to Self  Current suicidal ideation? Denies  Agreement Not to Harm Self Yes  Description of Agreement verbal  Danger to Others  Danger to Others None reported or observed

## 2024-01-14 NOTE — Group Note (Signed)
 Recreation Therapy Group Note   Group Topic:Coping Skills  Group Date: 01/14/2024 Start Time: 1000 End Time: 1050 Facilitators: Rosina Lowenstein, LRT, CTRS Location:  Craft Room  Group Description: Mind Map.  Patient was provided a blank template of a diagram with 32 blank boxes in a tiered system, branching from the center (similar to a bubble chart). LRT directed patients to label the middle of the diagram "Coping Skills". LRT and patients then came up with 8 different coping skills as examples. Pt were directed to record their coping skills in the 2nd tier boxes closest to the center.  Patients would then share their coping skills with the group as LRT wrote them out. LRT gave a handout of 99 different coping skills at the end of group.   Goal Area(s) Addressed: Patients will be able to define "coping skills". Patient will identify new coping skills.  Patient will increase communication.   Affect/Mood: N/A   Participation Level: Did not attend    Clinical Observations/Individualized Feedback: Patient did not attend group.   Plan: Continue to engage patient in RT group sessions 2-3x/week.   Rosina Lowenstein, LRT, CTRS 01/14/2024 12:07 PM

## 2024-01-14 NOTE — Progress Notes (Signed)
   01/14/2024  0400PM  Psych Admission Type (Psych Patients Only)  Admission Status Voluntary  Psychosocial Assessment  Patient Complaints None  Eye Contact Brief  Facial Expression Flat  Affect Flat  Speech Soft  Interaction Isolative;Minimal  Motor Activity Slow  Appearance/Hygiene In scrubs  Behavior Characteristics Cooperative  Mood Pleasant  Thought Process  Coherency Disorganized  Content Preoccupation  Delusions None reported or observed  Perception WDL  Hallucination None reported or observed  Judgment Impaired  Confusion Mild  Danger to Self  Current suicidal ideation? Denies  Agreement Not to Harm Self Yes  Description of Agreement verbal  Danger to Others  Danger to Others None reported or observed

## 2024-01-14 NOTE — Group Note (Signed)
 [facility] LCSW Group Therapy Note   Group Date: 01/14/2024 Start Time: 1330 End Time: 1430  Type of Therapy/Topic:  Group Therapy:  Feelings about Diagnosis  Participation Level:  None   Description of Group:    This group will allow patients to explore their thoughts and feelings about diagnoses they have received. Patients will be guided to explore their level of understanding and acceptance of these diagnoses. Facilitator will encourage patients to process their thoughts and feelings about the reactions of others to their diagnosis, and will guide patients in identifying ways to discuss their diagnosis with significant others in their lives. This group will be process-oriented, with patients participating in exploration of their own experiences as well as giving and receiving support and challenge from other group members.   Therapeutic Goals: 1. Patient will demonstrate understanding of diagnosis as evidence by identifying two or more symptoms of the disorder:  2. Patient will be able to express two feelings regarding the diagnosis 3. Patient will demonstrate ability to communicate their needs through discussion and/or role plays  Summary of Patient Progress: Patient was present in group. Patient appeared to be attentive, however, did not engage in group discussions. Patient was alert and appeared supportive.   Therapeutic Modalities:   Cognitive Behavioral Therapy Brief Therapy Feelings Identification    Harden Mo, LCSW

## 2024-01-14 NOTE — Plan of Care (Signed)

## 2024-01-14 NOTE — Plan of Care (Signed)
  Problem: Education: Goal: Knowledge of  General Education information/materials will improve Outcome: Progressing   Problem: Activity: Goal: Interest or engagement in activities will improve Outcome: Progressing Goal: Sleeping patterns will improve Outcome: Progressing   Problem: Coping: Goal: Ability to verbalize frustrations and anger appropriately will improve Outcome: Progressing   Problem: Safety: Goal: Periods of time without injury will increase Outcome: Progressing

## 2024-01-15 DIAGNOSIS — F250 Schizoaffective disorder, bipolar type: Secondary | ICD-10-CM | POA: Diagnosis not present

## 2024-01-15 MED ORDER — DIVALPROEX SODIUM ER 250 MG PO TB24: 750.0000 mg | ORAL_TABLET | Freq: Two times a day (BID) | ORAL | 0 refills | Status: DC

## 2024-01-15 MED ORDER — OLANZAPINE 20 MG PO TABS: 20.0000 mg | ORAL_TABLET | Freq: Every day | ORAL | 0 refills | Status: DC

## 2024-01-15 NOTE — Discharge Summary (Signed)
 Physician Discharge Summary Note  Patient:  Shawn Nicholson is an 43 y.o., male MRN:  [id number] DOB:  [birth year] Patient phone:  [phone] (home)  Patient address:   [address] [place] [address],  Total Time spent with patient: 2.5 hours  Date of Admission:  01/09/2024 Date of Discharge: 01/15/2024  Reason for Admission:  $48-year-old never married [place] American male with a reported history of schizoaffective disorder, bipolar type, cannabis use disorder, and non-adherence to his medication regimen. He was voluntarily brought to [facility] by his mother on 01/08/2024 due to worsening psychiatric symptoms, including auditory and visual hallucinations, confusion, irritability, difficulty concentrating, and sleep disturbances.Per chart review, the patient had been off his prescribed psychiatric medications for approximately four months. His mother expressed concerns about his abnormal behaviors, including increased isolation and difficulty communicating.Upon evaluation, the patient reported,hearing voices daily for the past three weeks, describes one male voice, unrecognizable at times most recent auditory hallucination occurred the day before admission Intermittent visual hallucinations of "entities" or "shadowy figures" for the past three weeks Difficulty sleeping (5-6 hours per night, down from usual 8 hours)Obsessive thoughts described as "thinking a lot about correcting myself."Denial of paranoia, delusions, depression, or anxiety Denial of mania, though past records indicate manic episodes. The UDS was positive for THC, consistent with his self-reported daily cannabis use for 20 years. He denied current suicidal ideation (SI) or homicidal ideation (HI) but had a history of a suicide attempt at age 43 (overdose attempt, no hospitalization). Due to worsening psychotic symptoms, medication non-adherence, and lack of outpatient mental health engagement, inpatient admission was deemed  necessary for stabilization, medication management, and safety monitoring.  Principal Problem: Schizoaffective disorder, bipolar type [facility]) Discharge Diagnoses: Principal Problem:   Schizoaffective disorder, bipolar type (HCC) Active Problems:   Cannabis abuse   Non compliance w medication regimen   Past Psychiatric History: see below  Past Medical History:  Past Medical History:  Diagnosis Date   Allergy    Anxiety    no meds   Boxer's fracture    RLF   Depression    Substance abuse (HCC)     Past Surgical History:  Procedure Laterality Date   CLOSED REDUCTION METACARPAL WITH PERCUTANEOUS PINNING  10/23/2012   Procedure: CLOSED REDUCTION METACARPAL WITH PERCUTANEOUS PINNING;  Surgeon: Tami Ribas, MD;  Location: [facility];  Service: Orthopedics;  Laterality: Right;   OPEN REDUCTION INTERNAL FIXATION (ORIF) METACARPAL  10/23/2012   Procedure: OPEN REDUCTION INTERNAL FIXATION (ORIF) METACARPAL;  Surgeon: Tami Ribas, MD;  Location: [facility];  Service: Orthopedics;;  RIGHT SMALL AND RING FINGER ORIF VERSUS CLOSED REDUCTION PERCUTANEOUS PINNING   Family History:  Family History  Problem Relation Age of Onset   Hypertension Father    Arthritis Mother    Arthritis Other    Hypertension Other    Cancer Neg Hx    Alcohol abuse Neg Hx    Depression Neg Hx    Diabetes Neg Hx    Drug abuse Neg Hx    Early death Neg Hx    Heart disease Neg Hx    Hyperlipidemia Neg Hx    Kidney disease Neg Hx    Stroke Neg Hx    Family Psychiatric  History: see above Social History:  Social History   Substance and Sexual Activity  Alcohol Use Yes   Alcohol/week: 3.0 standard drinks of alcohol   Types: 3 Cans of beer per week   Comment:  social     Social History   Substance and Sexual Activity  Drug Use Yes   Frequency: 7.0 times per week   Types: Marijuana   Comment: daily, wrapped in his cigars, abt 1 gram daily    Social History    Socioeconomic History   Marital status: Single    Spouse name: Not on file   Number of children: 0   Years of education: Not on file   Highest education level: Some college, no degree  Occupational History   Not on file  Tobacco Use   Smoking status: Light Smoker    Types: Cigarettes   Smokeless tobacco: Never  Vaping Use   Vaping status: Never Used  Substance and Sexual Activity   Alcohol use: Yes    Alcohol/week: 3.0 standard drinks of alcohol    Types: 3 Cans of beer per week    Comment: social   Drug use: Yes    Frequency: 7.0 times per week    Types: Marijuana    Comment: daily, wrapped in his cigars, abt 1 gram daily   Sexual activity: Not Currently  Other Topics Concern   Not on file  Social History Narrative   Lives at home with his mother    Social Drivers of Corporate investment banker Strain: Not on file  Food Insecurity: No Food Insecurity (01/09/2024)   Hunger Vital Sign    Worried About Running Out of Food in the Last Year: Never true    Ran Out of Food in the Last Year: Never true  Transportation Needs: No Transportation Needs (01/09/2024)   PRAPARE - Administrator, Civil Service (Medical): No    Lack of Transportation (Non-Medical): No  Physical Activity: Not on file  Stress: Not on file  Social Connections: Not on file    Hospital Course:  The patient was voluntarily admitted for worsening psychotic symptoms, medication non-adherence, and cannabis use contributing to auditory and visual hallucinations, confusion, and irritability. Upon admission, he displayed internally preoccupied behavior, constricted affect, and latent thought processing. He initially denied paranoia, delusions, depression, and anxiety but acknowledged persistent auditory hallucinations (one male voice) and intermittent visual hallucinations of shadowy figures for the past three weeks.Medication reconciliation revealed that he had been off his psychiatric medications for  approximately four months.UDS was positive for THC, consistent with his daily cannabis use over the past 20 years.Initially isolative, with minimal engagement in treatment activities or social interactions. Limited insight into his illness and past medication needs.Medication Adjustments & Stabilization: Depakote ER started at 1000 mg HS, later increased to 750 mg BID for mood stabilization Zyprexa increased from 10 mg to 20 mg HS to target psychotic symptoms.The patient gradually began participating in group therapy and engaged minimally in social interactions. He remained somewhat isolative but attended occasional group sessions and demonstrated improvement in affect and overall mood stability. Over time, he reported decreased auditory hallucinations and improved sleep patterns. By discharge, he denied ongoing AVH, paranoia, or SI/HI and acknowledged the importance of medication adherence. Due to stabilization of symptoms and meeting the maximum hospitalization criteria, the patient was deemed appropriate for discharge with structured outpatient care    Musculoskeletal: Strength & Muscle Tone: within normal limits Gait & Station: normal Patient leans: N/A   Psychiatric Specialty Exam:  Presentation  General Appearance:  Appropriate for Environment  Eye Contact: Minimal  Speech: Clear and Coherent; Normal Rate  Speech Volume: Normal  Handedness: Right   Mood and Affect  Mood: Euthymic  Affect: Flat; Blunt   Thought Process  Thought Processes: Coherent  Descriptions of Associations:Intact  Orientation:Full (Time, Place and Person)  Thought Content:WDL  History of Schizophrenia/Schizoaffective disorder:Yes  Duration of Psychotic Symptoms:Greater than six months  Hallucinations:Hallucinations: None Description of Auditory Hallucinations: denies Description of Visual Hallucinations: denies  Ideas of Reference:None  Suicidal Thoughts:Suicidal Thoughts:  No  Homicidal Thoughts:Homicidal Thoughts: No   Sensorium  Memory: Immediate Fair; Remote Fair; Recent Fair  Judgment: Fair  Insight: Fair   Art therapist  Concentration: Fair  Attention Span: Fair  Recall: [employer] of Knowledge: Fair  Language: Fair   Psychomotor Activity  Psychomotor Activity: Psychomotor Activity: Normal   Assets  Assets: Communication Skills; Social Support   Sleep  Sleep: Sleep: Good Number of Hours of Sleep: 6    Physical Exam: Physical Exam Vitals and nursing note reviewed.  Constitutional:      Appearance: Normal appearance.  HENT:     Head: Normocephalic and atraumatic.     Nose: Nose normal.  Pulmonary:     Effort: Pulmonary effort is normal.  Musculoskeletal:        General: Normal range of motion.     Cervical back: Normal range of motion.  Neurological:     General: No focal deficit present.     Mental Status: He is alert. Mental status is at baseline.  Psychiatric:        Attention and Perception: Attention and perception normal.        Mood and Affect: Affect is blunt and flat.        Speech: Speech normal.        Behavior: Behavior normal. Behavior is cooperative.        Thought Content: Thought content normal.        Cognition and Memory: Cognition and memory normal.        Judgment: Judgment is impulsive.   ROS Blood pressure 100/63, pulse 76, temperature 97.8 F (36.6 C), resp. rate 17, height 6\' 5"  (1.956 m), weight 94.6 kg, SpO2 99%. Body mass index is 24.72 kg/m.   Social History   Tobacco Use  Smoking Status Light Smoker   Types: Cigarettes  Smokeless Tobacco Never   Tobacco Cessation:  N/A, patient does not currently use tobacco products   Blood Alcohol level:  Lab Results  Component Value Date   ETH <10 01/08/2024   ETH <10 10/30/2021    Metabolic Disorder Labs:  Lab Results  Component Value Date   HGBA1C 5.4 01/09/2024   MPG 108.28 01/09/2024   MPG 93.93 10/30/2021    No results found for: "PROLACTIN" Lab Results  Component Value Date   CHOL 127 01/09/2024   TRIG 102 01/09/2024   HDL 29 (L) 01/09/2024   CHOLHDL 4.4 01/09/2024   VLDL 20 01/09/2024   LDLCALC 78 01/09/2024   LDLCALC 87 10/30/2021    See Psychiatric Specialty Exam and Suicide Risk Assessment completed by Attending Physician prior to discharge.  Discharge destination:  Home  Is patient on multiple antipsychotic therapies at discharge:  No   Has Patient had three or more failed trials of antipsychotic monotherapy by history:  No  Recommended Plan for Multiple Antipsychotic Therapies: NA   Allergies as of 01/15/2024       Reactions   Other Other (See Comments)   Reaction to pecans showed up on allergy test   Penicillins Rash        Medication List  STOP taking these medications    hydrOXYzine 25 MG tablet Commonly known as: ATARAX   traZODone 50 MG tablet Commonly known as: DESYREL       TAKE these medications      Indication  divalproex 250 MG 24 hr tablet Commonly known as: DEPAKOTE ER Take 3 tablets (750 mg total) by mouth 2 (two) times daily. What changed:  medication strength how much to take when to take this  Indication: MIXED BIPOLAR AFFECTIVE DISORDER   OLANZapine 20 MG tablet Commonly known as: ZYPREXA Take 1 tablet (20 mg total) by mouth at bedtime. What changed:  medication strength See the new instructions.  Indication: Schizophrenia        Follow-up Information     [facility] Go to.   Why: Virtual appointment is 01/22/23 at 8 AM. Contact information: [address]  [address] [place] [address] [phone]                 Follow-up recommendations:  Activity:  as directed Diet:  heart healthy  Comments:   Depakote ER 750 mg BID (Mood stabilization) Zyprexa 20 mg HS (Psychosis management) Follow up with an outpatient mental health provider within 7-10 days Referral to a psychiatrist for continued  medication management Consider therapy referral for medication adherence and substance use counseling ?? 988 Suicide & Crisis Lifeline (Available 24/7 via call or text) ?? Crisis Text Line - Text HOME to [phone] Referral to substance use treatment programs for cannabis use disorder. Encouraged abstinence to improve mental health outcomes.  Signed: Myriam Forehand, NP 01/15/2024, 11:03 AM

## 2024-01-15 NOTE — Progress Notes (Signed)
  [facility] Adult Case Management Discharge Plan :  Will you be returning to the same living situation after discharge:  Yes,  Patient to return home.  At discharge, do you have transportation home?: Yes,  Patient to be transported by his mother.  Do you have the ability to pay for your medications: Yes, AMBETTER / AMBETTER OON   Release of information consent forms completed and in the chart;  Patient's signature needed at discharge.  Patient to Follow up at:  Follow-up Information     [facility] Go to.   Why: Virtual appointment is 01/22/23 at 8 AM. Contact information: [address]  [address] [place] [address] [phone]                 Next level of care provider has access to [facility] Link:no  Safety Planning and Suicide Prevention discussed: Education Completed; Shawn Nicholson, [phone], mother,  has been identified by the patient as the family member/significant other with whom the patient will be residing, and identified as the person(s) who will aid the patient in the event of a mental health crisis (suicidal ideations/suicide attempt).  With written consent from the patient, the family member/significant other has been provided the following suicide prevention education, prior to the and/or following the discharge of the patient.      Has patient been referred to the Quitline?: Patient refused referral for treatment  Patient has been referred for addiction treatment: No known substance use disorder.  Lowry Ram, LCSW 01/15/2024, 9:57 AM

## 2024-01-15 NOTE — BHH Suicide Risk Assessment (Signed)
 [facility] Discharge Suicide Risk Assessment   Principal Problem: Schizoaffective disorder, bipolar type (HCC) Discharge Diagnoses: Principal Problem:   Schizoaffective disorder, bipolar type (HCC) Active Problems:   Acute psychosis (HCC)   Cannabis abuse   Non compliance w medication regimen   Total Time spent with patient: 2.5 hours  Musculoskeletal: Strength & Muscle Tone: within normal limits Gait & Station: normal Patient leans: N/A  Psychiatric Specialty Exam  Presentation  General Appearance:  Appropriate for Environment  Eye Contact: Minimal  Speech: Clear and Coherent; Normal Rate  Speech Volume: Normal  Handedness: Right   Mood and Affect  Mood: Euthymic  Duration of Depression Symptoms: Greater than two weeks  Affect: Flat; Blunt   Thought Process  Thought Processes: Coherent  Descriptions of Associations:Intact  Orientation:Full (Time, Place and Person)  Thought Content:WDL  History of Schizophrenia/Schizoaffective disorder:Yes  Duration of Psychotic Symptoms:Greater than six months  Hallucinations:Hallucinations: None Description of Auditory Hallucinations: denies Description of Visual Hallucinations: denies  Ideas of Reference:None  Suicidal Thoughts:Suicidal Thoughts: No  Homicidal Thoughts:Homicidal Thoughts: No   Sensorium  Memory: Immediate Fair; Remote Fair; Recent Fair  Judgment: Fair  Insight: Fair   Art therapist  Concentration: Fair  Attention Span: Fair  Recall: [employer] of Knowledge: Fair  Language: Fair   Psychomotor Activity  Psychomotor Activity: Psychomotor Activity: Normal   Assets  Assets: Communication Skills; Social Support   Sleep  Sleep: Sleep: Good Number of Hours of Sleep: 6   Physical Exam: Physical Exam Vitals and nursing note reviewed.  Constitutional:      Appearance: Normal appearance.  HENT:     Head: Normocephalic and atraumatic.     Nose: Nose  normal.  Pulmonary:     Effort: Pulmonary effort is normal.  Musculoskeletal:        General: Normal range of motion.     Cervical back: Normal range of motion.  Neurological:     General: No focal deficit present.     Mental Status: He is alert. Mental status is at baseline.  Psychiatric:        Attention and Perception: Attention and perception normal.        Mood and Affect: Affect is blunt and flat.        Speech: Speech normal.        Behavior: Behavior normal. Behavior is cooperative.        Thought Content: Thought content normal.        Cognition and Memory: Cognition and memory normal.        Judgment: Judgment is impulsive.    Review of Systems  All other systems reviewed and are negative.  Blood pressure 100/63, pulse 76, temperature 97.8 F (36.6 C), resp. rate 17, height 6\' 5"  (1.956 m), weight 94.6 kg, SpO2 99%. Body mass index is 24.72 kg/m.  Mental Status Per Nursing Assessment::   On Admission:  NA  Demographic Factors:  Male, Low socioeconomic status, and Unemployed  Loss Factors: Loss of significant relationship  Historical Factors: Family history of mental illness or substance abuse and Impulsivity  Risk Reduction Factors:   Living with another person, especially a relative and Positive social support  Continued Clinical Symptoms:  Bipolar Disorder:   Depressive phase Alcohol/Substance Abuse/Dependencies Schizophrenia:   Paranoid or undifferentiated type  Cognitive Features That Contribute To Risk:  Closed-mindedness    Suicide Risk:  Minimal: No identifiable suicidal ideation.  Patients presenting with no risk factors but with morbid ruminations; may be classified as  minimal risk based on the severity of the depressive symptoms   Follow-up Information     [facility] Go to.   Why: Virtual appointment is 01/22/23 at 8 AM. Contact information: [address]  [address] [place] [address] [phone]                 Plan Of  Care/Follow-up recommendations:  Activity:  as tolerated  Diet:  heart healthy Depakote ER 750 mg BID (Mood stabilization) Zyprexa 20 mg HS (Psychosis management) Follow up with an outpatient mental health provider within 7-10 days Referral to a psychiatrist for continued medication management Consider therapy referral for medication adherence and substance use counseling ?? 988 Suicide & Crisis Lifeline (Available 24/7 via call or text) ?? Crisis Text Line - Text HOME to [phone] Referral to substance use treatment programs for cannabis use disorder. Encouraged abstinence to improve mental health outcomes. Myriam Forehand, NP 01/15/2024, 11:02 AM

## 2024-01-15 NOTE — Progress Notes (Signed)

## 2024-01-15 NOTE — Progress Notes (Signed)
   01/15/24 0912  Psych Admission Type (Psych Patients Only)  Admission Status Voluntary  Psychosocial Assessment  Patient Complaints None  Eye Contact Fair  Facial Expression Flat  Affect Flat  Speech Soft  Interaction Minimal  Motor Activity Slow  Appearance/Hygiene In scrubs  Behavior Characteristics Cooperative;Calm  Mood Pleasant  Thought Process  Coherency WDL  Content WDL  Delusions None reported or observed  Perception WDL  Hallucination None reported or observed  Judgment Impaired  Confusion Mild  Danger to Self  Current suicidal ideation? Denies  Danger to Others  Danger to Others None reported or observed

## 2024-02-21 ENCOUNTER — Telehealth (INDEPENDENT_AMBULATORY_CARE_PROVIDER_SITE_OTHER): Payer: Self-pay | Admitting: Physician Assistant

## 2024-02-21 ENCOUNTER — Encounter (HOSPITAL_COMMUNITY): Payer: Self-pay

## 2024-02-21 ENCOUNTER — Encounter (HOSPITAL_COMMUNITY): Payer: Self-pay | Admitting: Physician Assistant

## 2024-02-21 DIAGNOSIS — F1210 Cannabis abuse, uncomplicated: Secondary | ICD-10-CM | POA: Diagnosis not present

## 2024-02-21 DIAGNOSIS — F250 Schizoaffective disorder, bipolar type: Secondary | ICD-10-CM

## 2024-02-21 MED ORDER — OLANZAPINE 20 MG PO TABS: 20.0000 mg | ORAL_TABLET | Freq: Every day | ORAL | 1 refills | Status: DC

## 2024-02-21 MED ORDER — DIVALPROEX SODIUM ER 250 MG PO TB24: 750.0000 mg | ORAL_TABLET | Freq: Two times a day (BID) | ORAL | 1 refills | Status: DC

## 2024-02-21 NOTE — Progress Notes (Addendum)
 BH MD/PA/NP OP Progress Note  Virtual Visit via Video Note  I connected with Shawn Nicholson on 02/21/24 at 10:30 AM EDT by a video enabled telemedicine application and verified that I am speaking with the correct person using two identifiers.  Location: Patient: Home Provider: Clinic   I discussed the limitations of evaluation and management by telemedicine and the availability of in person appointments. The patient expressed understanding and agreed to proceed.  Follow Up Instructions:  I discussed the assessment and treatment plan with the patient. The patient was provided an opportunity to ask questions and all were answered. The patient agreed with the plan and demonstrated an understanding of the instructions.   The patient was advised to call back or seek an in-person evaluation if the symptoms worsen or if the condition fails to improve as anticipated.  I provided 11 minutes of non-face-to-face time during this encounter.  Gates Kasal, PA    02/21/2024 12:34 PM Gemini JESSEN HAILU  MRN:  [id number]  Chief Complaint:  Chief Complaint  Patient presents with   Follow-up   Medication Refill   HPI:   Shawn Nicholson is a 43 year old male with a past psychiatric history significant for schizoaffective disorder (bipolar type) and cannabis abuse who presents to [facility] for follow-up and medication management.  Patient was last seen by this provider on 01/08/2024.  During his last encounter, patient was being managed on the following psychiatric medications:  Trazodone  50 mg at bedtime as needed Olanzapine  10 mg at bedtime Depakote  ER 1000 mg 24-hour tablet at bedtime  Patient presents to the encounter stating that he was recently discharged from the hospital.  Per chart review, patient was admitted to [facility] on 01/09/2024 from [facility] due to worsening psychiatric symptoms  including auditory/visual hallucinations, confusion, irritability, difficulty concentrating, and sleep disturbances.  It was determined that patient was nonadherent to his current medication regimen.  Patient was discharged on 01/15/2024 on the following psychiatric medications:  Olanzapine  20 mg at bedtime Depakote  750 mg 2 times daily  Since being discharged from [facility], patient denies issues with his current medication regimen.  He reports that his medications are administered by his mother.  He denies experiencing any adverse side effects at this time.  Patient denies overt depressive symptoms nor does he endorse anxiety.  He also denies auditory or visual hallucinations as well as paranoia.  Patient denies any new stressors at this time.  A PHQ-9 screen was performed with the patient scoring a 9.  A GAD-7 screen was also performed the patient scored a 1.  Patient is alert and oriented x 4, calm, cooperative, and fully engaged in conversation during the encounter.  Patient endorses pretty good mood.  Patient exhibits euthymic mood with appropriate affect.  Patient denies suicidal or homicidal ideations.  He further denies auditory or visual hallucinations and does not appear to be responding to internal/external stimuli.  Patient endorses good sleep and receives on average 7-1/2 hours of sleep per night.  Patient endorses good appetite and eats on average 2-3 meals per day.  Patient denies alcohol consumption.  Patient endorses tobacco use and states that he smokes a cigar sparingly.  Patient endorses illicit drug use in the form of marijuana.  Visit Diagnosis:    ICD-10-CM   1. Cannabis abuse  F12.10     2. Schizoaffective disorder, bipolar type (HCC)  F25.0 OLANZapine  (ZYPREXA ) 20 MG  tablet    divalproex  (DEPAKOTE  ER) 250 MG 24 hr tablet      Past Psychiatric History:  Patient has a past psychiatric history significant for schizoaffective disorder (bipolar type)   Patient endorses a past  history of hospitalization due to mental health.  He reports that he has not been hospitalized for a while.  Per chart review, patient was last hospitalized at [facility] on 11/01/2021 and discharged on 11/09/2021. - Patient was most recently hospitalized on 2/27/20225 at [facility] due to non-adherence to his medication regimen. -Patient was voluntarily brought to [facility] by his mother due to worsening psychiatric symptoms.   Patient endorses a past history of suicide attempt stating that he took several Ritalin pills as a kid.   Patient denies a past history of homicide attempt  Past Medical History:  Past Medical History:  Diagnosis Date   Allergy    Anxiety    no meds   Boxer's fracture    RLF   Depression    Substance abuse (HCC)     Past Surgical History:  Procedure Laterality Date   CLOSED REDUCTION METACARPAL WITH PERCUTANEOUS PINNING  10/23/2012   Procedure: CLOSED REDUCTION METACARPAL WITH PERCUTANEOUS PINNING;  Surgeon: Milagros Alf, MD;  Location: [facility];  Service: Orthopedics;  Laterality: Right;   OPEN REDUCTION INTERNAL FIXATION (ORIF) METACARPAL  10/23/2012   Procedure: OPEN REDUCTION INTERNAL FIXATION (ORIF) METACARPAL;  Surgeon: Milagros Alf, MD;  Location: [facility];  Service: Orthopedics;;  RIGHT SMALL AND RING FINGER ORIF VERSUS CLOSED REDUCTION PERCUTANEOUS PINNING    Family Psychiatric History:  Patient denies a family history of psychiatric illness   Family history of suicide attempt: Patient is unsure of family history of suicide attempt Family history of homicide attempt: Patient denies Family history of substance abuse: Patient denies  Family History:  Family History  Problem Relation Age of Onset   Hypertension Father    Arthritis Mother    Arthritis Other    Hypertension Other    Cancer Neg Hx    Alcohol abuse Neg Hx    Depression  Neg Hx    Diabetes Neg Hx    Drug abuse Neg Hx    Early death Neg Hx    Heart disease Neg Hx    Hyperlipidemia Neg Hx    Kidney disease Neg Hx    Stroke Neg Hx     Social History:  Social History   Socioeconomic History   Marital status: Single    Spouse name: Not on file   Number of children: 0   Years of education: Not on file   Highest education level: Some college, no degree  Occupational History   Not on file  Tobacco Use   Smoking status: Light Smoker    Types: Cigarettes   Smokeless tobacco: Never  Vaping Use   Vaping status: Never Used  Substance and Sexual Activity   Alcohol use: Yes    Alcohol/week: 3.0 standard drinks of alcohol    Types: 3 Cans of beer per week    Comment: social   Drug use: Yes    Frequency: 7.0 times per week    Types: Marijuana    Comment: daily, wrapped in his cigars, abt 1 gram daily   Sexual activity: Not Currently  Other Topics Concern   Not on file  Social History Narrative   Lives at home with his mother  Social Drivers of Corporate investment banker Strain: Not on file  Food Insecurity: No Food Insecurity (01/09/2024)   Hunger Vital Sign    Worried About Running Out of Food in the Last Year: Never true    Ran Out of Food in the Last Year: Never true  Transportation Needs: No Transportation Needs (01/09/2024)   PRAPARE - Administrator, Civil Service (Medical): No    Lack of Transportation (Non-Medical): No  Physical Activity: Not on file  Stress: Not on file  Social Connections: Not on file    Allergies:  Allergies  Allergen Reactions   Other Other (See Comments)    Reaction to pecans showed up on allergy test   Penicillins Rash    Metabolic Disorder Labs: Lab Results  Component Value Date   HGBA1C 5.4 01/09/2024   MPG 108.28 01/09/2024   MPG 93.93 10/30/2021   No results found for: "PROLACTIN" Lab Results  Component Value Date   CHOL 127 01/09/2024   TRIG 102 01/09/2024   HDL 29 (L)  01/09/2024   CHOLHDL 4.4 01/09/2024   VLDL 20 01/09/2024   LDLCALC 78 01/09/2024   LDLCALC 87 10/30/2021   Lab Results  Component Value Date   TSH 0.862 01/08/2024   TSH 0.413 10/30/2021    Therapeutic Level Labs: No results found for: "LITHIUM" Lab Results  Component Value Date   VALPROATE 80 11/07/2021   No results found for: "CBMZ"  Current Medications: Current Outpatient Medications  Medication Sig Dispense Refill   divalproex  (DEPAKOTE  ER) 250 MG 24 hr tablet Take 3 tablets (750 mg total) by mouth 2 (two) times daily. 180 tablet 1   OLANZapine  (ZYPREXA ) 20 MG tablet Take 1 tablet (20 mg total) by mouth at bedtime. 30 tablet 1   No current facility-administered medications for this visit.     Musculoskeletal: Strength & Muscle Tone: within normal limits Gait & Station: normal Patient leans: N/A  Psychiatric Specialty Exam: Review of Systems  Psychiatric/Behavioral:  Negative for decreased concentration, dysphoric mood, hallucinations, self-injury, sleep disturbance and suicidal ideas. The patient is not nervous/anxious and is not hyperactive.     There were no vitals taken for this visit.There is no height or weight on file to calculate BMI.  General Appearance: Casual  Eye Contact:  Good  Speech:  Clear and Coherent and Normal Rate  Volume:  Normal  Mood:  Euthymic  Affect:  Appropriate  Thought Process:  Coherent, Goal Directed, and Descriptions of Associations: Intact  Orientation:  Full (Time, Place, and Person)  Thought Content: WDL   Suicidal Thoughts:  No  Homicidal Thoughts:  No  Memory:  Immediate;   Fair Recent;   Fair Remote;   Fair  Judgement:  Fair  Insight:  Fair  Psychomotor Activity:  Normal  Concentration:  Concentration: Fair and Attention Span: Fair  Recall:  [employer] of Knowledge: Fair  Language: Good  Akathisia:  No  Handed:  Ambidextrous  AIMS (if indicated): not done  Assets:  Communication Skills Desire for  Improvement Housing Social Support Vocational/Educational  ADL's:  Intact  Cognition: WNL  Sleep:  Good   Screenings: AIMS    Flowsheet Row [facility] Ad[facility] (Discharged[facility] from [facility]  AIMS Total Score 0 0      AUDIT    Flowsheet Row Admission (Discharged) from 01/09/2024 in [facility]IC[facility] Admission (Discharged[facility] from [facility]  Alcohol Use Disorder Identification Test Final Score (AUDIT) 1 3 0      GAD-7    Flowsheet Row Video Visit from 02/21/2024 in [facility] Office Visit from 01/08/2024 in [facility] Counselor from 01/07/2024 in [facility]  Total GAD-7 Score 1 17 16       PHQ2-9    Flowsheet Row Video Visit from 02/21/2024 in [facility] Office Visit from 01/08/2024 in [facility] Counselor from 01/07/2024 in [facility] Office Visit from 12/18/2021 in [facility] Office Visit from 05/25/2015 in [facility]  PHQ-2 Total Score 2 3 3  0 0  PHQ-9 Total Score 9 17 19  -- --      Flowsheet Row Video Visit from 02/21/2024 in [facility] Admission (Discharged) from 01/09/2024 in [facility] INPATIENT BEHAVIORAL MEDICINE ED from 01/08/2024 in [facility]  C-SSRS RISK CATEGORY Moderate Risk No Risk Moderate Risk        Assessment and Plan:   Crescencio M. Karasik is a 43 year old male with a past psychiatric history significant for schizoaffective disorder (bipolar type) and cannabis abuse who presents to  [facility] for follow-up and medication management.  Patient was last seen by this provider on 01/08/2024.  During his last encounter, patient was being managed on the following psychiatric medications:  Trazodone  50 mg at bedtime as needed Olanzapine  10 mg at bedtime Depakote  ER 1000 mg 24-hour tablet at bedtime  Patient was recently discharged from [facility] after exhibiting the following symptoms due to medication nonadherence: auditory/visual hallucinations, confusion, irritability, difficulty concentrating, and sleep disturbances.  Patient was discharged from [facility]: On the following psychiatric medications:  Olanzapine  20 mg at bedtime Depakote  ER 750 mg 2 times daily  Patient reports no issues or concerns regarding his use of his medications.  He denies experiencing any adverse side effects at this time.  Patient denies overt depressive symptoms nor does he endorse anxiety.  Patient further denies psychotic symptoms and does not appear to be responding to internal/external stimuli.  Patient endorses stability on his current medication regimen and would like to continue taking his medications as prescribed.  Patient's medications to be e-prescribed to pharmacy of choice.  Marijuana cessation was encouraged during the encounter.  Collaboration of Care: Collaboration of Care: Medication Management AEB provider managing patient's psychiatric medication and Psychiatrist AEB patient being followed by a mental health provider  Patient/Guardian was advised Release of Information must be obtained prior to any record release in order to collaborate their care with an outside provider. Patient/Guardian was advised if they have not already done so to contact the registration department to sign all necessary forms in order for us  to release information regarding their care.   Consent: Patient/Guardian gives verbal consent for treatment and  assignment of benefits for services provided during this visit. Patient/Guardian expressed understanding and agreed to proceed.   1. Cannabis abuse (Primary)  2. Schizoaffective disorder, bipolar type (HCC)  - OLANZapine  (ZYPREXA ) 20 MG tablet; Take 1 tablet (20 mg total) by mouth at bedtime.  Dispense: 30 tablet; Refill: 1 - divalproex  (DEPAKOTE  ER) 250 MG 24 hr tablet; Take 3 tablets (750 mg  total) by mouth 2 (two) times daily.  Dispense: 180 tablet; Refill: 1  Patient to follow up in 6 weeks Provider spent a total of 11 minutes with the patient/reviewing the patient's chart  Gates Kasal, PA 02/21/2024, 12:34 PM

## 2024-04-03 ENCOUNTER — Telehealth (HOSPITAL_COMMUNITY): Payer: Self-pay | Admitting: Physician Assistant

## 2024-04-14 ENCOUNTER — Encounter (HOSPITAL_COMMUNITY): Payer: Self-pay | Admitting: Physician Assistant

## 2024-04-14 ENCOUNTER — Telehealth (INDEPENDENT_AMBULATORY_CARE_PROVIDER_SITE_OTHER): Payer: Self-pay | Admitting: Physician Assistant

## 2024-04-14 DIAGNOSIS — F250 Schizoaffective disorder, bipolar type: Secondary | ICD-10-CM

## 2024-04-14 DIAGNOSIS — F1210 Cannabis abuse, uncomplicated: Secondary | ICD-10-CM

## 2024-04-14 MED ORDER — DIVALPROEX SODIUM ER 250 MG PO TB24: 750.0000 mg | ORAL_TABLET | Freq: Two times a day (BID) | ORAL | 2 refills | Status: DC

## 2024-04-14 MED ORDER — OLANZAPINE 20 MG PO TABS: 20.0000 mg | ORAL_TABLET | Freq: Every day | ORAL | 2 refills | Status: DC

## 2024-04-14 NOTE — Progress Notes (Signed)
 BH MD/PA/NP OP Progress Note  Virtual Visit via Video Note  I connected with Shawn Nicholson on 04/14/24 at  1:30 PM EDT by a video enabled telemedicine application and verified that I am speaking with the correct person using two identifiers.  Location: Patient: Home Provider: Clinic   I discussed the limitations of evaluation and management by telemedicine and the availability of in person appointments. The patient expressed understanding and agreed to proceed.  Follow Up Instructions:  I discussed the assessment and treatment plan with the patient. The patient was provided an opportunity to ask questions and all were answered. The patient agreed with the plan and demonstrated an understanding of the instructions.   The patient was advised to call back or seek an in-person evaluation if the symptoms worsen or if the condition fails to improve as anticipated.  I provided 7 minutes of non-face-to-face time during this encounter.  Gates Kasal, PA    04/14/2024 9:37 PM Shawn Nicholson  MRN:  [id number]  Chief Complaint:  Chief Complaint  Patient presents with   Follow-up   Medication Refill   HPI:   Shawn Nicholson is a 43 year old male with a past psychiatric history significant for schizoaffective disorder (bipolar type) and cannabis abuse who presents to [facility] via virtual video visit for follow-up and medication management. Patient being managed on the following psychiatric medications:  Olanzapine  20 mg at bedtime Depakote  750 mg 2 times daily  Patient reports that he has been taking his medications regularly.  He denied experiencing any adverse side effects from the use of his current medication regimen.  Patient denies overt depressive symptoms nor does he endorse anxiety.  Patient denies changes in his behavior and further denies auditory or visual hallucinations.  A PHQ-9 screen was performed with the patient scoring a 0.   A GAD-7 screen was also performed with the patient scoring a 0.  Patient is alert and oriented x 4, calm, cooperative, and fully engaged in conversation during the encounter.  Patient endorses good mood.  Patient exhibits euthymic mood with appropriate affect.  Patient denies suicidal or homicidal ideations.  He further denies auditory or visual hallucinations and does not appear to be responding to internal/external stimuli.  Patient endorses good sleep and receives on average 8 hours of sleep per night.  Patient endorses good appetite needs on average 2 meals along with snacking throughout the day.  Patient denies alcohol consumption.  Patient endorses illicit drug use in the form of smoking marijuana blunts.  Visit Diagnosis:    ICD-10-CM   1. Schizoaffective disorder, bipolar type (HCC)  F25.0 OLANZapine  (ZYPREXA ) 20 MG tablet    divalproex  (DEPAKOTE  ER) 250 MG 24 hr tablet      Past Psychiatric History:  Patient has a past psychiatric history significant for schizoaffective disorder (bipolar type)   Patient endorses a past history of hospitalization due to mental health.  He reports that he has not been hospitalized for a while.  Per chart review, patient was last hospitalized at [facility] on 11/01/2021 and discharged on 11/09/2021. - Patient was most recently hospitalized on 2/27/20225 at [facility] due to non-adherence to his medication regimen. -Patient was voluntarily brought to [facility] by his mother due to worsening psychiatric symptoms.   Patient endorses a past history of suicide attempt stating that he took several Ritalin pills as a kid.   Patient denies a  past history of homicide attempt  Past Medical History:  Past Medical History:  Diagnosis Date   Allergy    Anxiety    no meds   Boxer's fracture    RLF   Depression    Substance abuse (HCC)     Past Surgical History:  Procedure  Laterality Date   CLOSED REDUCTION METACARPAL WITH PERCUTANEOUS PINNING  10/23/2012   Procedure: CLOSED REDUCTION METACARPAL WITH PERCUTANEOUS PINNING;  Surgeon: Milagros Alf, MD;  Location: [facility];  Service: Orthopedics;  Laterality: Right;   OPEN REDUCTION INTERNAL FIXATION (ORIF) METACARPAL  10/23/2012   Procedure: OPEN REDUCTION INTERNAL FIXATION (ORIF) METACARPAL;  Surgeon: Milagros Alf, MD;  Location: [facility];  Service: Orthopedics;;  RIGHT SMALL AND RING FINGER ORIF VERSUS CLOSED REDUCTION PERCUTANEOUS PINNING    Family Psychiatric History:  Patient denies a family history of psychiatric illness   Family history of suicide attempt: Patient is unsure of family history of suicide attempt Family history of homicide attempt: Patient denies Family history of substance abuse: Patient denies  Family History:  Family History  Problem Relation Age of Onset   Hypertension Father    Arthritis Mother    Arthritis Other    Hypertension Other    Cancer Neg Hx    Alcohol abuse Neg Hx    Depression Neg Hx    Diabetes Neg Hx    Drug abuse Neg Hx    Early death Neg Hx    Heart disease Neg Hx    Hyperlipidemia Neg Hx    Kidney disease Neg Hx    Stroke Neg Hx     Social History:  Social History   Socioeconomic History   Marital status: Single    Spouse name: Not on file   Number of children: 0   Years of education: Not on file   Highest education level: Some college, no degree  Occupational History   Not on file  Tobacco Use   Smoking status: Light Smoker    Types: Cigarettes   Smokeless tobacco: Never  Vaping Use   Vaping status: Never Used  Substance and Sexual Activity   Alcohol use: Yes    Alcohol/week: 3.0 standard drinks of alcohol    Types: 3 Cans of beer per week    Comment: social   Drug use: Yes    Frequency: 7.0 times per week    Types: Marijuana    Comment: daily, wrapped in his cigars, abt 1 gram daily   Sexual  activity: Not Currently  Other Topics Concern   Not on file  Social History Narrative   Lives at home with his mother    Social Drivers of Corporate investment banker Strain: Not on file  Food Insecurity: No Food Insecurity (01/09/2024)   Hunger Vital Sign    Worried About Running Out of Food in the Last Year: Never true    Ran Out of Food in the Last Year: Never true  Transportation Needs: No Transportation Needs (01/09/2024)   PRAPARE - Administrator, Civil Service (Medical): No    Lack of Transportation (Non-Medical): No  Physical Activity: Not on file  Stress: Not on file  Social Connections: Not on file    Allergies:  Allergies  Allergen Reactions   Other Other (See Comments)    Reaction to pecans showed up on allergy test   Penicillins Rash    Metabolic Disorder Labs: Lab Results  Component Value  Date   HGBA1C 5.4 01/09/2024   MPG 108.28 01/09/2024   MPG 93.93 10/30/2021   No results found for: PROLACTIN Lab Results  Component Value Date   CHOL 127 01/09/2024   TRIG 102 01/09/2024   HDL 29 (L) 01/09/2024   CHOLHDL 4.4 01/09/2024   VLDL 20 01/09/2024   LDLCALC 78 01/09/2024   LDLCALC 87 10/30/2021   Lab Results  Component Value Date   TSH 0.862 01/08/2024   TSH 0.413 10/30/2021    Therapeutic Level Labs: No results found for: LITHIUM Lab Results  Component Value Date   VALPROATE 80 11/07/2021   No results found for: CBMZ  Current Medications: Current Outpatient Medications  Medication Sig Dispense Refill   divalproex  (DEPAKOTE  ER) 250 MG 24 hr tablet Take 3 tablets (750 mg total) by mouth 2 (two) times daily. 180 tablet 2   OLANZapine  (ZYPREXA ) 20 MG tablet Take 1 tablet (20 mg total) by mouth at bedtime. 30 tablet 2   No current facility-administered medications for this visit.     Musculoskeletal: Strength & Muscle Tone: within normal limits Gait & Station: normal Patient leans: N/A  Psychiatric Specialty  Exam: Review of Systems  Psychiatric/Behavioral:  Negative for decreased concentration, dysphoric mood, hallucinations, self-injury, sleep disturbance and suicidal ideas. The patient is not nervous/anxious and is not hyperactive.     There were no vitals taken for this visit.There is no height or weight on file to calculate BMI.  General Appearance: Casual  Eye Contact:  Good  Speech:  Clear and Coherent and Normal Rate  Volume:  Normal  Mood:  Euthymic  Affect:  Appropriate  Thought Process:  Coherent, Goal Directed, and Descriptions of Associations: Intact  Orientation:  Full (Time, Place, and Person)  Thought Content: WDL   Suicidal Thoughts:  No  Homicidal Thoughts:  No  Memory:  Immediate;   Fair Recent;   Fair Remote;   Fair  Judgement:  Fair  Insight:  Fair  Psychomotor Activity:  Normal  Concentration:  Concentration: Fair and Attention Span: Fair  Recall:  [employer] of Knowledge: Fair  Language: Good  Akathisia:  No  Handed:  Ambidextrous  AIMS (if indicated): not done  Assets:  Communication Skills Desire for Improvement Housing Social Support Vocational/Educational  ADL's:  Intact  Cognition: WNL  Sleep:  Good   Screenings: AIMS    Flowsheet Row Video Visit from 04/14/2024 in [facility]) [facility] (Dis[facility]) from [facility]  AIMS Total Score 0 0 0      AUDIT    Flowsheet Row Admission (Discharged) from 01/09/2024 in [facility] Admission (Discharged[facility] (Discharged) from OP [facility]  Alcohol Use Disorder Identification Test Final Score (AUDIT) 1 3 0      GAD-7    Flowsheet Row Video Visit from 04/14/2024 in [facility] Video Visit from 02/21/2024 in [facility] Office Visit from 01/08/2024 in [facility] Counselor from 01/07/2024 in [facility]  Total GAD-7 Score 0 1 17 16       PHQ2-9    Flowsheet Row Video Visit from 04/14/2024 in [facility] Video Visit from 02/21/2024 in [facility]  Office Visit from 01/08/2024 in [facility] Counselor from 01/07/2024 in [facility] Office Visit from 12/18/2021 in [facility]  PHQ-2 Total Score 0 2 3 3  0  PHQ-9 Total Score -- 9 17 19  --      Flowsheet Row Video Visit from 04/14/2024 in [facility] Video Visit from 02/21/2024 in [facility] Admission (Discharged) from 01/09/2024 in [facility] INPATIENT BEHAVIORAL MEDICINE  C-SSRS RISK CATEGORY Moderate Risk Moderate Risk No Risk        Assessment and Plan:   Agustine M. Weathers is a 43 year old male with a past psychiatric history significant for schizoaffective disorder (bipolar type) and cannabis abuse who presents to [facility] for follow-up and medication management.  Patient presents to the encounter stating that he continues to take his medications regularly.  He denies experiencing any adverse side effects from his current medication regimen.  An aims assessment was performed with the patient scoring of 0.  Patient denies experiencing overt depressive symptoms nor does he endorse anxiety.  A PHQ-9 screen was performed with the patient scoring a 0.  A GAD-7 screen was also performed with the patient scoring a 0.  Patient denies experiencing changes in his behaviors and does not appear to be responding to internal/external stimuli.  Patient denies the need for dosage adjustments at this  time and would like to continue taking his medications as prescribed.  Patient's medications to be e-prescribed to pharmacy of choice.  A [place] Suicide Severity Rating Scale was performed with the patient being considered moderate risk.  Patient denies suicidal ideations and is able to contract for safety following the conclusion of the encounter.  Marijuana cessation was encouraged prior to the conclusion and the encounter.  Collaboration of Care: Collaboration of Care: Medication Management AEB provider managing patient's psychiatric medication and Psychiatrist AEB patient being followed by a mental health provider  Patient/Guardian was advised Release of Information must be obtained prior to any record release in order to collaborate their care with an outside provider. Patient/Guardian was advised if they have not already done so to contact the registration department to sign all necessary forms in order for us  to release information regarding their care.   Consent: Patient/Guardian gives verbal consent for treatment and assignment of benefits for services provided during this visit. Patient/Guardian expressed understanding and agreed to proceed.   1. Schizoaffective disorder, bipolar type (HCC)  - OLANZapine  (ZYPREXA ) 20 MG tablet; Take 1 tablet (20 mg total) by mouth at bedtime.  Dispense: 30 tablet; Refill: 2 - divalproex  (DEPAKOTE  ER) 250 MG 24 hr tablet; Take 3 tablets (750 mg total) by mouth 2 (two) times daily.  Dispense: 180 tablet; Refill: 2  2. Cannabis abuse (Primary)  Patient to follow up in 2 months Provider spent a total of 7 minutes with the patient/reviewing the patient's chart  Gates Kasal, PA 04/14/2024, 9:37 PM

## 2024-06-16 ENCOUNTER — Telehealth (INDEPENDENT_AMBULATORY_CARE_PROVIDER_SITE_OTHER): Admitting: Physician Assistant

## 2024-06-16 DIAGNOSIS — F250 Schizoaffective disorder, bipolar type: Secondary | ICD-10-CM

## 2024-06-16 DIAGNOSIS — F1210 Cannabis abuse, uncomplicated: Secondary | ICD-10-CM | POA: Diagnosis not present

## 2024-06-16 DIAGNOSIS — Z79899 Other long term (current) drug therapy: Secondary | ICD-10-CM

## 2024-06-16 MED ORDER — OLANZAPINE 20 MG PO TABS: 20.0000 mg | ORAL_TABLET | Freq: Every day | ORAL | 2 refills | Status: DC

## 2024-06-16 MED ORDER — DIVALPROEX SODIUM ER 250 MG PO TB24: 750.0000 mg | ORAL_TABLET | Freq: Two times a day (BID) | ORAL | 2 refills | Status: DC

## 2024-06-17 ENCOUNTER — Encounter (HOSPITAL_COMMUNITY): Payer: Self-pay | Admitting: Physician Assistant

## 2024-06-17 NOTE — Progress Notes (Signed)
 BH MD/PA/NP OP Progress Note  Virtual Visit via Video Note  I connected with Shawn Nicholson on 06/16/24 at  2:30 PM EDT by a video enabled telemedicine application and verified that I am speaking with the correct person using two identifiers.  Location: Patient: Home Provider: Clinic   I discussed the limitations of evaluation and management by telemedicine and the availability of in person appointments. The patient expressed understanding and agreed to proceed.  Follow Up Instructions:  I discussed the assessment and treatment plan with the patient. The patient was provided an opportunity to ask questions and all were answered. The patient agreed with the plan and demonstrated an understanding of the instructions.   The patient was advised to call back or seek an in-person evaluation if the symptoms worsen or if the condition fails to improve as anticipated.  I provided 11 minutes of non-face-to-face time during this encounter.  Reginia FORBES Bolster, PA    06/16/2024 2:30 PM Shawn Nicholson  MRN:  [id number]  Chief Complaint:  Chief Complaint  Patient presents with   Follow-up   Medication Refill   HPI:   Shawn Nicholson is a 43 year old male with a past psychiatric history significant for schizoaffective disorder (bipolar type) and cannabis abuse who presents to [facility] via virtual video visit for follow-up and medication management. Patient being managed on the following psychiatric medications:  Olanzapine  20 mg at bedtime Depakote  750 mg 2 times daily  Patient presents to the encounter stating that he continues to take his medications regularly and denies experiencing any adverse side effects.  Patient denies overt depressive symptoms nor does he endorse anxiety.  Patient denies changes in his behavior.  He denies paranoia and further denies auditory or visual hallucinations.  Patient denies any new stressors at this time.  A PHQ-9  screen was performed with the patient scoring a 0.  A GAD-7 screen was also performed with the patient scoring a 1.  Patient is alert and oriented x 4, calm, cooperative, and fully engaged in conversation during the encounter.  Patient endorses pretty good mood.  Patient exhibits euthymic mood with appropriate affect.  Patient denies suicidal or homicidal ideations.  He further denies auditory or visual hallucinations and does not appear to be responding to internal/external stimuli.  Patient endorses good sleep and receives on average 8 to 9 hours of sleep per night.  Patient endorses good appetite and eats on average 2 meals per day.  Patient endorses alcohol consumption on occasion.  Patient denies tobacco use.  Patient endorses illicit drug use in the form of marijuana.  Visit Diagnosis:    ICD-10-CM   1. On valproic acid therapy  Z79.899 Valproic Acid level    Valproic Acid level    2. Schizoaffective disorder, bipolar type (HCC)  F25.0 OLANZapine  (ZYPREXA ) 20 MG tablet    divalproex  (DEPAKOTE  ER) 250 MG 24 hr tablet      Past Psychiatric History:  Patient has a past psychiatric history significant for schizoaffective disorder (bipolar type)   Patient endorses a past history of hospitalization due to mental health.  He reports that he has not been hospitalized for a while.  Per chart review, patient was last hospitalized at [facility] on 11/01/2021 and discharged on 11/09/2021. - Patient was most recently hospitalized on 2/27/20225 at [facility] due to non-adherence to his medication regimen. -Patient was voluntarily brought to [facility] by  his mother due to worsening psychiatric symptoms.   Patient endorses a past history of suicide attempt stating that he took several Ritalin pills as a kid.   Patient denies a past history of homicide attempt  Past Medical History:  Past Medical History:  Diagnosis Date    Allergy    Anxiety    no meds   Boxer's fracture    RLF   Depression    Substance abuse (HCC)     Past Surgical History:  Procedure Laterality Date   CLOSED REDUCTION METACARPAL WITH PERCUTANEOUS PINNING  10/23/2012   Procedure: CLOSED REDUCTION METACARPAL WITH PERCUTANEOUS PINNING;  Surgeon: Franky JONELLE Curia, MD;  Location: [facility];  Service: Orthopedics;  Laterality: Right;   OPEN REDUCTION INTERNAL FIXATION (ORIF) METACARPAL  10/23/2012   Procedure: OPEN REDUCTION INTERNAL FIXATION (ORIF) METACARPAL;  Surgeon: Franky JONELLE Curia, MD;  Location:  SURGERY CENTER;  Service: Orthopedics;;  RIGHT SMALL AND RING FINGER ORIF VERSUS CLOSED REDUCTION PERCUTANEOUS PINNING    Family Psychiatric History:  Patient denies a family history of psychiatric illness   Family history of suicide attempt: Patient is unsure of family history of suicide attempt Family history of homicide attempt: Patient denies Family history of substance abuse: Patient denies  Family History:  Family History  Problem Relation Age of Onset   Hypertension Father    Arthritis Mother    Arthritis Other    Hypertension Other    Cancer Neg Hx    Alcohol abuse Neg Hx    Depression Neg Hx    Diabetes Neg Hx    Drug abuse Neg Hx    Early death Neg Hx    Heart disease Neg Hx    Hyperlipidemia Neg Hx    Kidney disease Neg Hx    Stroke Neg Hx     Social History:  Social History   Socioeconomic History   Marital status: Single    Spouse name: Not on file   Number of children: 0   Years of education: Not on file   Highest education level: Some college, no degree  Occupational History   Not on file  Tobacco Use   Smoking status: Light Smoker    Types: Cigarettes   Smokeless tobacco: Never  Vaping Use   Vaping status: Never Used  Substance and Sexual Activity   Alcohol use: Yes    Alcohol/week: 3.0 standard drinks of alcohol    Types: 3 Cans of beer per week    Comment: social   Drug  use: Yes    Frequency: 7.0 times per week    Types: Marijuana    Comment: daily, wrapped in his cigars, abt 1 gram daily   Sexual activity: Not Currently  Other Topics Concern   Not on file  Social History Narrative   Lives at home with his mother    Social Drivers of Corporate investment banker Strain: Not on file  Food Insecurity: No Food Insecurity (01/09/2024)   Hunger Vital Sign    Worried About Running Out of Food in the Last Year: Never true    Ran Out of Food in the Last Year: Never true  Transportation Needs: No Transportation Needs (01/09/2024)   PRAPARE - Administrator, Civil Service (Medical): No    Lack of Transportation (Non-Medical): No  Physical Activity: Not on file  Stress: Not on file  Social Connections: Not on file    Allergies:  Allergies  Allergen Reactions  Other Other (See Comments)    Reaction to pecans showed up on allergy test   Penicillins Rash    Metabolic Disorder Labs: Lab Results  Component Value Date   HGBA1C 5.4 01/09/2024   MPG 108.28 01/09/2024   MPG 93.93 10/30/2021   No results found for: PROLACTIN Lab Results  Component Value Date   CHOL 127 01/09/2024   TRIG 102 01/09/2024   HDL 29 (L) 01/09/2024   CHOLHDL 4.4 01/09/2024   VLDL 20 01/09/2024   LDLCALC 78 01/09/2024   LDLCALC 87 10/30/2021   Lab Results  Component Value Date   TSH 0.862 01/08/2024   TSH 0.413 10/30/2021    Therapeutic Level Labs: No results found for: LITHIUM Lab Results  Component Value Date   VALPROATE 80 11/07/2021   No results found for: CBMZ  Current Medications: Current Outpatient Medications  Medication Sig Dispense Refill   divalproex  (DEPAKOTE  ER) 250 MG 24 hr tablet Take 3 tablets (750 mg total) by mouth 2 (two) times daily. 180 tablet 2   OLANZapine  (ZYPREXA ) 20 MG tablet Take 1 tablet (20 mg total) by mouth at bedtime. 30 tablet 2   No current facility-administered medications for this visit.      Musculoskeletal: Strength & Muscle Tone: within normal limits Gait & Station: normal Patient leans: N/A  Psychiatric Specialty Exam: Review of Systems  Psychiatric/Behavioral:  Negative for decreased concentration, dysphoric mood, hallucinations, self-injury, sleep disturbance and suicidal ideas. The patient is not nervous/anxious and is not hyperactive.     There were no vitals taken for this visit.There is no height or weight on file to calculate BMI.  General Appearance: Casual  Eye Contact:  Good  Speech:  Clear and Coherent and Normal Rate  Volume:  Normal  Mood:  Euthymic  Affect:  Appropriate  Thought Process:  Coherent, Goal Directed, and Descriptions of Associations: Intact  Orientation:  Full (Time, Place, and Person)  Thought Content: WDL   Suicidal Thoughts:  No  Homicidal Thoughts:  No  Memory:  Immediate;   Fair Recent;   Fair Remote;   Fair  Judgement:  Fair  Insight:  Fair  Psychomotor Activity:  Normal  Concentration:  Concentration: Fair and Attention Span: Fair  Recall:  [employer] of Knowledge: Fair  Language: Good  Akathisia:  No  Handed:  Ambidextrous  AIMS (if indicated): not done  Assets:  Communication Skills Desire for Improvement Housing Social Support Vocational/Educational  ADL's:  Intact  Cognition: WNL  Sleep:  Good   Screenings: AIMS    Flowsheet Row Video Visit from 06/16/2024 in [facility] Video Visit from 04/14/2024 in [facility]) [facility] (Dis[facility]) from [facility]  AIMS Total Score 0 0 0 0   AUDIT    Flowsheet Row Admission (Discharged) from 01/09/2024 in [facility] Admission (Discharged[facility] ([facility]) from OP Visit from  11[facility]13[facility]2018 [facility]  Alcohol Use Disorder Identification Test Final Score (AUDIT) 1 3 0   GAD-7    Flowsheet Row Video Visit from 06/16/2024 in [facility] Video Visit from 04/14/2024 in [facility] Video Visit from 02/21/2024 in [facility] Office Visit from 01/08/2024 in [facility] Counselor from 01/07/2024  in [facility]  Total GAD-7 Score 1 0 1 17 16    PHQ2-9    Flowsheet Row Video Visit from 06/16/2024 in [facility] Video Visit from 04/14/2024 in [facility] Video Visit from 02/21/2024 in [facility] Office Visit from 01/08/2024 in [facility] Counselor from 01/07/2024 in [facility]  PHQ-2 Total Score 0 0 2 3 3   PHQ-9 Total Score -- -- 9 17 19    Flowsheet Row Video Visit from 06/16/2024 in [facility] Video Visit from 04/14/2024 in [facility] Video Visit from 02/21/2024 in [facility]  C-SSRS RISK CATEGORY Moderate Risk Moderate Risk Moderate Risk     Assessment and Plan:   Shawn Nicholson is a 43 year old male with a past psychiatric history significant for schizoaffective disorder (bipolar type) and cannabis abuse who presents to [facility] for follow-up and medication management.  Patient presents to the encounter stating that he continues to take his medications regularly.  Patient denies experiencing any adverse side effects.  An aims assessment was performed with the patient scoring a 0.  Patient denies overt depressive symptoms nor does he endorse anxiety.  A PHQ-9 screen was performed with the patient scoring a 0.  A GAD-7 screen was also  performed with the patient scoring a 1.  Patient denies any changes in his behavior or paranoia.  Patient does not appear to be responding to internal/external stimuli.  Patient endorses stability on his current medication regimen and would like to continue taking his medications as prescribed.  Patient's medications to be e-prescribed to pharmacy of choice.  Due to his use of Depakote , provider to obtain the following lab from the patient: Valproic acid level.  Patient's other labs and EKG are up to date.  A [place] Suicide Severity Rating Scale was performed with the patient being considered moderate risk.  Patient denies suicidal ideations and is able to contract for safety at this time.  Safety planning was discussed with the patient prior to the conclusion of the encounter.  - Patient was instructed to contact 911 in the event of a mental health crisis. - Patient was instructed to contact [facility] [employer] in the event of a mental health crisis. - Patient was instructed to present to [facility] in the event of a mental health crisis.  Collaboration of Care: Collaboration of Care: Medication Management AEB provider managing patient's psychiatric medication and Psychiatrist AEB patient being followed by a mental health provider  Patient/Guardian was advised Release of Information must be obtained prior to any record release in order to collaborate their care with an outside provider. Patient/Guardian was advised if they have not already done so to contact the registration department to sign all necessary forms in order for us  to release information regarding their care.   Consent: Patient/Guardian gives verbal consent for treatment and assignment of benefits for services provided during this visit. Patient/Guardian expressed understanding and agreed to proceed.   1. On valproic acid therapy (Primary)  - Valproic Acid level; Future  2.  Schizoaffective disorder, bipolar type (HCC)  - OLANZapine  (ZYPREXA ) 20 MG tablet; Take 1 tablet (20 mg total) by mouth at bedtime.  Dispense: 30 tablet; Refill: 2 - divalproex  (DEPAKOTE  ER) 250 MG 24 hr tablet; Take 3 tablets (750 mg total) by mouth  2 (two) times daily.  Dispense: 180 tablet; Refill: 2  3. Cannabis abuse  Patient to follow up in 2 months Provider spent a total of 11 minutes with the patient/reviewing the patient's chart  Reginia FORBES Bolster, PA 06/17/2024, 10:59 AM

## 2024-08-18 ENCOUNTER — Telehealth (INDEPENDENT_AMBULATORY_CARE_PROVIDER_SITE_OTHER): Admitting: Physician Assistant

## 2024-08-18 ENCOUNTER — Encounter (HOSPITAL_COMMUNITY): Payer: Self-pay | Admitting: Physician Assistant

## 2024-08-18 DIAGNOSIS — F250 Schizoaffective disorder, bipolar type: Secondary | ICD-10-CM | POA: Diagnosis not present

## 2024-08-18 DIAGNOSIS — F1210 Cannabis abuse, uncomplicated: Secondary | ICD-10-CM | POA: Diagnosis not present

## 2024-08-18 DIAGNOSIS — Z79899 Other long term (current) drug therapy: Secondary | ICD-10-CM | POA: Diagnosis not present

## 2024-08-18 MED ORDER — OLANZAPINE 20 MG PO TABS: 20.0000 mg | ORAL_TABLET | Freq: Every day | ORAL | 3 refills | Status: DC

## 2024-08-18 MED ORDER — DIVALPROEX SODIUM ER 250 MG PO TB24: 750.0000 mg | ORAL_TABLET | Freq: Two times a day (BID) | ORAL | 3 refills | Status: DC

## 2024-08-18 NOTE — Progress Notes (Signed)
 BH MD/PA/NP OP Progress Note  Virtual Visit via Video Note  I connected with Shawn Nicholson on 08/18/24 at  2:30 PM EDT by a video enabled telemedicine application and verified that I am speaking with the correct person using two identifiers.  Location: Patient: Home Provider: Clinic   I discussed the limitations of evaluation and management by telemedicine and the availability of in person appointments. The patient expressed understanding and agreed to proceed.  Follow Up Instructions:  I discussed the assessment and treatment plan with the patient. The patient was provided an opportunity to ask questions and all were answered. The patient agreed with the plan and demonstrated an understanding of the instructions.   The patient was advised to call back or seek an in-person evaluation if the symptoms worsen or if the condition fails to improve as anticipated.  I provided 13 minutes of non-face-to-face time during this encounter.  Shawn FORBES Bolster, PA    08/18/2024 2:30 PM Shawn Nicholson  MRN:  [id number]  Chief Complaint:  Chief Complaint  Patient presents with   Follow-up   Medication Refill   HPI:   Shawn Nicholson is a 43 year old male with a past psychiatric history significant for schizoaffective disorder (bipolar type) and cannabis abuse who presents to [facility] via virtual video visit for follow-up and medication management. Patient being managed on the following psychiatric medications:  Olanzapine  20 mg at bedtime Depakote  750 mg 2 times daily  Patient presents to the encounter stating that he continues to take his medications regularly and denies experiencing any adverse side effects at this time.  Patient denies overt depressive symptoms nor does he endorse anxiety.  Patient denies recent episodes of mania.  He denies paranoia and further denies auditory or visual hallucinations.  Patient denies any other stressors at this  time.  A PHQ 2 screen was performed with the patient scoring a 0.  A GAD-7 screen was also performed with the patient scoring a 0.  Patient is alert and oriented x 4, calm, cooperative, and fully engaged in conversation during the encounter.  Patient endorses great mood.  Patient exhibits euthymic mood with appropriate affect.  Patient denies suicidal or homicidal ideations.  He further denies auditory or visual hallucinations and does not appear to be responding to internal/external stimuli.  Patient endorses good sleep and receives on average 8 hours of sleep per night.  Patient endorses good appetite and eats 2-3 meals per day.  Patient denies alcohol consumption or tobacco use.  Patient endorses illicit drug use in the form of marijuana.  Visit Diagnosis:    ICD-10-CM   1. Schizoaffective disorder, bipolar type (HCC)  F25.0 OLANZapine  (ZYPREXA ) 20 MG tablet    divalproex  (DEPAKOTE  ER) 250 MG 24 hr tablet      Past Psychiatric History:  Patient has a past psychiatric history significant for schizoaffective disorder (bipolar type)   Patient endorses a past history of hospitalization due to mental health.  He reports that he has not been hospitalized for a while.  Per chart review, patient was last hospitalized at [facility] on 11/01/2021 and discharged on 11/09/2021. - Patient was most recently hospitalized on 2/27/20225 at [facility] due to non-adherence to his medication regimen. -Patient was voluntarily brought to [facility] by his mother due to worsening psychiatric symptoms.   Patient endorses a past history of suicide attempt stating that he took several Ritalin pills  as a kid.   Patient denies a past history of homicide attempt  Past Medical History:  Past Medical History:  Diagnosis Date   Allergy    Anxiety    no meds   Boxer's fracture    RLF   Depression    Substance abuse (HCC)     Past  Surgical History:  Procedure Laterality Date   CLOSED REDUCTION METACARPAL WITH PERCUTANEOUS PINNING  10/23/2012   Procedure: CLOSED REDUCTION METACARPAL WITH PERCUTANEOUS PINNING;  Surgeon: Shawn JONELLE Curia, MD;  Location: [facility];  Service: Orthopedics;  Laterality: Right;   OPEN REDUCTION INTERNAL FIXATION (ORIF) METACARPAL  10/23/2012   Procedure: OPEN REDUCTION INTERNAL FIXATION (ORIF) METACARPAL;  Surgeon: Shawn JONELLE Curia, MD;  Location: [facility];  Service: Orthopedics;;  RIGHT SMALL AND RING FINGER ORIF VERSUS CLOSED REDUCTION PERCUTANEOUS PINNING    Family Psychiatric History:  Patient denies a family history of psychiatric illness   Family history of suicide attempt: Patient is unsure of family history of suicide attempt Family history of homicide attempt: Patient denies Family history of substance abuse: Patient denies  Family History:  Family History  Problem Relation Age of Onset   Hypertension Father    Arthritis Mother    Arthritis Other    Hypertension Other    Cancer Neg Hx    Alcohol abuse Neg Hx    Depression Neg Hx    Diabetes Neg Hx    Drug abuse Neg Hx    Early death Neg Hx    Heart disease Neg Hx    Hyperlipidemia Neg Hx    Kidney disease Neg Hx    Stroke Neg Hx     Social History:  Social History   Socioeconomic History   Marital status: Single    Spouse name: Not on file   Number of children: 0   Years of education: Not on file   Highest education level: Some college, no degree  Occupational History   Not on file  Tobacco Use   Smoking status: Light Smoker    Types: Cigarettes   Smokeless tobacco: Never  Vaping Use   Vaping status: Never Used  Substance and Sexual Activity   Alcohol use: Yes    Alcohol/week: 3.0 standard drinks of alcohol    Types: 3 Cans of beer per week    Comment: social   Drug use: Yes    Frequency: 7.0 times per week    Types: Marijuana    Comment: daily, wrapped in his cigars, abt 1  gram daily   Sexual activity: Not Currently  Other Topics Concern   Not on file  Social History Narrative   Lives at home with his mother    Social Drivers of Corporate investment banker Strain: Not on file  Food Insecurity: No Food Insecurity (01/09/2024)   Hunger Vital Sign    Worried About Running Out of Food in the Last Year: Never true    Ran Out of Food in the Last Year: Never true  Transportation Needs: No Transportation Needs (01/09/2024)   PRAPARE - Administrator, Civil Service (Medical): No    Lack of Transportation (Non-Medical): No  Physical Activity: Not on file  Stress: Not on file  Social Connections: Not on file    Allergies:  Allergies  Allergen Reactions   Other Other (See Comments)    Reaction to pecans showed up on allergy test   Penicillins Rash  Metabolic Disorder Labs: Lab Results  Component Value Date   HGBA1C 5.4 01/09/2024   MPG 108.28 01/09/2024   MPG 93.93 10/30/2021   No results found for: PROLACTIN Lab Results  Component Value Date   CHOL 127 01/09/2024   TRIG 102 01/09/2024   HDL 29 (L) 01/09/2024   CHOLHDL 4.4 01/09/2024   VLDL 20 01/09/2024   LDLCALC 78 01/09/2024   LDLCALC 87 10/30/2021   Lab Results  Component Value Date   TSH 0.862 01/08/2024   TSH 0.413 10/30/2021    Therapeutic Level Labs: No results found for: LITHIUM Lab Results  Component Value Date   VALPROATE 80 11/07/2021   No results found for: CBMZ  Current Medications: Current Outpatient Medications  Medication Sig Dispense Refill   divalproex  (DEPAKOTE  ER) 250 MG 24 hr tablet Take 3 tablets (750 mg total) by mouth 2 (two) times daily. 180 tablet 3   OLANZapine  (ZYPREXA ) 20 MG tablet Take 1 tablet (20 mg total) by mouth at bedtime. 30 tablet 3   No current facility-administered medications for this visit.     Musculoskeletal: Strength & Muscle Tone: within normal limits Gait & Station: normal Patient leans: N/A  Psychiatric  Specialty Exam: Review of Systems  Psychiatric/Behavioral:  Negative for decreased concentration, dysphoric mood, hallucinations, self-injury, sleep disturbance and suicidal ideas. The patient is not nervous/anxious and is not hyperactive.     There were no vitals taken for this visit.There is no height or weight on file to calculate BMI.  General Appearance: Casual  Eye Contact:  Good  Speech:  Clear and Coherent and Normal Rate  Volume:  Normal  Mood:  Euthymic  Affect:  Appropriate  Thought Process:  Coherent, Goal Directed, and Descriptions of Associations: Intact  Orientation:  Full (Time, Place, and Person)  Thought Content: WDL   Suicidal Thoughts:  No  Homicidal Thoughts:  No  Memory:  Immediate;   Fair Recent;   Fair Remote;   Fair  Judgement:  Fair  Insight:  Fair  Psychomotor Activity:  Normal  Concentration:  Concentration: Fair and Attention Span: Fair  Recall:  [employer] of Knowledge: Fair  Language: Good  Akathisia:  No  Handed:  Ambidextrous  AIMS (if indicated): not done  Assets:  Communication Skills Desire for Improvement Housing Social Support Vocational/Educational  ADL's:  Intact  Cognition: WNL  Sleep:  Good   Screenings: AIMS    Flowsheet Row Video Visit from 08/18/2024 in [facility] Video Visit from 06/16/2024 in [facility] Video Visit from 04/14/2024 in [facility]) [facility] (Discharged[facility] from OP [facility]  AIMS Total Score 0 0 0 0 0   AUDIT    Flowsheet Row Admission (Discharged) from 01/09/2024 in [facility] Admission ([facility] Ad[facility] (Discharged) from OP [facility]   Alcohol Use Disorder Identification Test Final Score (AUDIT) 1 3 0   GAD-7    Flowsheet Row Video Visit from 08/18/2024 in [facility] Video Visit from 06/16/2024 in [facility] Video Visit from 04/14/2024 in [facility] Video Visit from 02/21/2024 in [facility] Office Visit from 01/08/2024 in [facility]  Total GAD-7 Score  0 1 0 1 17   PHQ2-9    Flowsheet Row Video Visit from 08/18/2024 in [facility] Video Visit from 06/16/2024 in [facility] Video Visit from 04/14/2024 in [facility] Video Visit from 02/21/2024 in [facility] Office Visit from 01/08/2024 in [facility]  PHQ-2 Total Score 0 0 0 2 3  PHQ-9 Total Score -- -- -- 9 17   Flowsheet Row Video Visit from 08/18/2024 in [facility] Video Visit from 06/16/2024 in [facility] Video Visit from 04/14/2024 in [facility]  C-SSRS RISK CATEGORY Moderate Risk Moderate Risk Moderate Risk     Assessment and Plan:   Shawn Nicholson is a 43 year old male with a past psychiatric history significant for schizoaffective disorder (bipolar type) and cannabis abuse who presents to [facility] via virtual video visit for follow-up and medication management.  Patient presents to the encounter stating that he continues to take his medications as prescribed and denies experiencing any adverse side effects.  An aims assessment was performed the patient scoring a 0.  Patient denied overt depressive symptoms nor does he endorse anxiety.  A PHQ 2 screen was performed with the patient scoring a 0.  A GAD-7 screen was also performed with the patient scoring a 0.   Patient denies paranoia and does not appear to be responding to internal/external stimuli.  Patient appears to be tolerating his medications.  He endorses stability and denies the need for dosage adjustments at this time.  Patient's medications to be e-prescribed to pharmacy of choice.  Patient's labs and EKG are up-to-date.  Patient's last EKG was performed on 01/09/2024.  A [place] Suicide Severity Rating Scale was performed with the patient being considered moderate risk.  Patient denies suicidal ideations and is able to contract for safety at this time.  Safety planning was discussed with the patient prior to the conclusion of the encounter.  - Patient was instructed to contact 911 in the event of a mental health crisis. - Patient was instructed to contact [facility] [employer] in the event of a mental health crisis. - Patient was instructed to present to [facility] in the event of a mental health crisis.  Collaboration of Care: Collaboration of Care: Medication Management AEB provider managing patient's psychiatric medication and Psychiatrist AEB patient being followed by a mental health provider  Patient/Guardian was advised Release of Information must be obtained prior to any record release in order to collaborate their care with an outside provider. Patient/Guardian was advised if they have not already done so to contact the registration department to sign all necessary forms in order for us  to release information regarding their care.   Consent: Patient/Guardian gives verbal consent for treatment and assignment of benefits for services provided during this visit. Patient/Guardian expressed understanding and agreed to proceed.   1. Schizoaffective disorder, bipolar type (HCC)  - OLANZapine  (ZYPREXA ) 20 MG tablet; Take 1 tablet (20 mg total) by mouth at bedtime.  Dispense: 30 tablet; Refill: 3 - divalproex  (DEPAKOTE  ER) 250 MG 24 hr tablet; Take 3  tablets (750 mg total) by mouth 2 (two) times daily.  Dispense: 180 tablet; Refill: 3  2. On valproic acid therapy (Primary) Pending lab  3. Cannabis abuse Marijuana cessation encouraged  Patient to follow up in 3 months Provider spent  a total of 13 minutes with the patient/reviewing the patient's chart  Shawn FORBES Bolster, PA 08/18/2024, 2:30 PM

## 2024-08-19 LAB — VALPROIC ACID LEVEL: Valproic Acid Lvl: 39 ug/mL — ABNORMAL LOW (ref 50–100)

## 2024-11-17 ENCOUNTER — Telehealth (HOSPITAL_COMMUNITY): Admitting: Physician Assistant

## 2024-11-17 DIAGNOSIS — Z79899 Other long term (current) drug therapy: Secondary | ICD-10-CM | POA: Diagnosis not present

## 2024-11-17 DIAGNOSIS — F250 Schizoaffective disorder, bipolar type: Secondary | ICD-10-CM

## 2024-11-17 DIAGNOSIS — F1210 Cannabis abuse, uncomplicated: Secondary | ICD-10-CM | POA: Diagnosis not present

## 2024-11-21 ENCOUNTER — Encounter (HOSPITAL_COMMUNITY): Payer: Self-pay | Admitting: Physician Assistant

## 2024-11-21 MED ORDER — OLANZAPINE 20 MG PO TABS: 20.0000 mg | ORAL_TABLET | Freq: Every day | ORAL | 3 refills | Status: AC

## 2024-11-21 MED ORDER — DIVALPROEX SODIUM ER 250 MG PO TB24: 750.0000 mg | ORAL_TABLET | Freq: Two times a day (BID) | ORAL | 3 refills | Status: AC

## 2024-11-21 NOTE — Progress Notes (Cosign Needed)
 BH MD/PA/NP OP Progress Note  Virtual Visit via Video Note  I connected with Shawn Nicholson on 11/18/23 at  2:30 PM EST by a video enabled telemedicine application and verified that I am speaking with the correct person using two identifiers.  Location: Patient: Home Provider: Clinic   I discussed the limitations of evaluation and management by telemedicine and the availability of in person appointments. The patient expressed understanding and agreed to proceed.  Follow Up Instructions:  I discussed the assessment and treatment plan with the patient. The patient was provided an opportunity to ask questions and all were answered. The patient agreed with the plan and demonstrated an understanding of the instructions.   The patient was advised to call back or seek an in-person evaluation if the symptoms worsen or if the condition fails to improve as anticipated.  I provided 9 minutes of non-face-to-face time during this encounter.  Reginia FORBES Bolster, PA    11/18/2023 2:30 PM Shawn Nicholson  MRN:  [id number]  Chief Complaint:  Chief Complaint  Patient presents with   Follow-up   Medication Refill   HPI:   Shawn Nicholson is a 44 year old male with a past psychiatric history significant for schizoaffective disorder (bipolar type) and cannabis abuse who presents to [facility] via virtual video visit for follow-up and medication management. Patient being managed on the following psychiatric medications:  Olanzapine  20 mg at bedtime Depakote  750 mg 2 times daily  Patient presents to the encounter stating that he has been taking his medications regularly and denies experiencing any adverse side effects.  Patient denies overt depressive symptoms nor does he endorse anxiety.  Patient denies change in his behavior nor does he endorse paranoia.  Patient further denies auditory or visual hallucinations.  Patient denies any new stressors at this time.   A PHQ 2 screen was performed with the patient scoring a 0.  A GAD-7 screen was also performed with the patient scoring a 0.  Patient is alert and oriented x 4, calm, cooperative, and fully engaged in conversation during the encounter.  Patient endorses good mood.  Patient exhibits euthymic mood with appropriate affect.  Patient denies suicidal or homicidal ideations.  He further denies auditory or visual hallucinations and does not appear to be responding to internal/external stimuli.  Patient endorses good sleep and receives on average 8 hours of sleep per night.  Patient endorses good appetite and eats on average 2-3 meals per day.  Patient endorses alcohol consumption on occasion.  Patient denies tobacco use.  Patient endorses illicit drug use in the form of marijuana.  Visit Diagnosis:    ICD-10-CM   1. Cannabis abuse  F12.10     2. Schizoaffective disorder, bipolar type (HCC)  F25.0 OLANZapine  (ZYPREXA ) 20 MG tablet    divalproex  (DEPAKOTE  ER) 250 MG 24 hr tablet    3. Long term current use of antipsychotic medication  Z79.899 EKG 12-Lead      Past Psychiatric History:  Patient has a past psychiatric history significant for schizoaffective disorder (bipolar type)   Patient endorses a past history of hospitalization due to mental health.  He reports that he has not been hospitalized for a while.  Per chart review, patient was last hospitalized at [facility] on 11/01/2021 and discharged on 11/09/2021. - Patient was most recently hospitalized on 2/27/20225 at [facility] due to non-adherence to his medication regimen. -Patient was voluntarily brought to [facility] by his mother due to worsening psychiatric symptoms.   Patient endorses a past history of suicide attempt stating that he took several Ritalin pills as a kid.   Patient denies a past history of homicide attempt  Past Medical History:  Past Medical  History:  Diagnosis Date   Allergy    Anxiety    no meds   Boxer's fracture    RLF   Depression    Substance abuse (HCC)     Past Surgical History:  Procedure Laterality Date   CLOSED REDUCTION METACARPAL WITH PERCUTANEOUS PINNING  10/23/2012   Procedure: CLOSED REDUCTION METACARPAL WITH PERCUTANEOUS PINNING;  Surgeon: Franky JONELLE Curia, MD;  Location: [facility];  Service: Orthopedics;  Laterality: Right;   OPEN REDUCTION INTERNAL FIXATION (ORIF) METACARPAL  10/23/2012   Procedure: OPEN REDUCTION INTERNAL FIXATION (ORIF) METACARPAL;  Surgeon: Franky JONELLE Curia, MD;  Location: [facility];  Service: Orthopedics;;  RIGHT SMALL AND RING FINGER ORIF VERSUS CLOSED REDUCTION PERCUTANEOUS PINNING    Family Psychiatric History:  Patient denies a family history of psychiatric illness   Family history of suicide attempt: Patient is unsure of family history of suicide attempt Family history of homicide attempt: Patient denies Family history of substance abuse: Patient denies  Family History:  Family History  Problem Relation Age of Onset   Hypertension Father    Arthritis Mother    Arthritis Other    Hypertension Other    Cancer Neg Hx    Alcohol abuse Neg Hx    Depression Neg Hx    Diabetes Neg Hx    Drug abuse Neg Hx    Early death Neg Hx    Heart disease Neg Hx    Hyperlipidemia Neg Hx    Kidney disease Neg Hx    Stroke Neg Hx     Social History:  Social History   Socioeconomic History   Marital status: Single    Spouse name: Not on file   Number of children: 0   Years of education: Not on file   Highest education level: Some college, no degree  Occupational History   Not on file  Tobacco Use   Smoking status: Light Smoker    Types: Cigarettes   Smokeless tobacco: Never  Vaping Use   Vaping status: Never Used  Substance and Sexual Activity   Alcohol use: Yes    Alcohol/week: 3.0 standard drinks of alcohol    Types: 3 Cans of beer per week     Comment: social   Drug use: Yes    Frequency: 7.0 times per week    Types: Marijuana    Comment: daily, wrapped in his cigars, abt 1 gram daily   Sexual activity: Not Currently  Other Topics Concern   Not on file  Social History Narrative   Lives at home with his mother    Social Drivers of Health   Tobacco Use: High Risk (11/21/2024)   Patient History    Smoking Tobacco Use: Light Smoker    Smokeless Tobacco Use: Never    Passive Exposure: Not on file  Financial Resource Strain: Not on file  Food Insecurity: No Food Insecurity (01/09/2024)   Hunger Vital Sign    Worried About Running Out of Food in the Last Year: Never true    Ran Out of Food in the Last Year: Never true  Transportation Needs: No Transportation Needs (01/09/2024)   PRAPARE - Transportation    Lack  of Transportation (Medical): No    Lack of Transportation (Non-Medical): No  Physical Activity: Not on file  Stress: Not on file  Social Connections: Not on file  Depression (PHQ2-9): Low Risk (11/17/2024)   Depression (PHQ2-9)    PHQ-2 Score: 0  Alcohol Screen: Low Risk (01/09/2024)   Alcohol Screen    Last Alcohol Screening Score (AUDIT): 1  Housing: Low Risk (01/09/2024)   Housing Stability Vital Sign    Unable to Pay for Housing in the Last Year: No    Number of Times Moved in the Last Year: 0    Homeless in the Last Year: No  Utilities: Not At Risk (01/09/2024)   AHC Utilities    Threatened with loss of utilities: No  Health Literacy: Not on file    Allergies:  Allergies  Allergen Reactions   Other Other (See Comments)    Reaction to pecans showed up on allergy test   Penicillins Rash    Metabolic Disorder Labs: Lab Results  Component Value Date   HGBA1C 5.4 01/09/2024   MPG 108.28 01/09/2024   MPG 93.93 10/30/2021   No results found for: PROLACTIN Lab Results  Component Value Date   CHOL 127 01/09/2024   TRIG 102 01/09/2024   HDL 29 (L) 01/09/2024   CHOLHDL 4.4 01/09/2024   VLDL 20  01/09/2024   LDLCALC 78 01/09/2024   LDLCALC 87 10/30/2021   Lab Results  Component Value Date   TSH 0.862 01/08/2024   TSH 0.413 10/30/2021    Therapeutic Level Labs: No results found for: LITHIUM Lab Results  Component Value Date   VALPROATE 39 (L) 08/18/2024   VALPROATE 80 11/07/2021   No results found for: CBMZ  Current Medications: Current Outpatient Medications  Medication Sig Dispense Refill   divalproex  (DEPAKOTE  ER) 250 MG 24 hr tablet Take 3 tablets (750 mg total) by mouth 2 (two) times daily. 180 tablet 3   OLANZapine  (ZYPREXA ) 20 MG tablet Take 1 tablet (20 mg total) by mouth at bedtime. 30 tablet 3   No current facility-administered medications for this visit.     Musculoskeletal: Strength & Muscle Tone: within normal limits Gait & Station: normal Patient leans: N/A  Psychiatric Specialty Exam: Review of Systems  Psychiatric/Behavioral:  Negative for decreased concentration, dysphoric mood, hallucinations, self-injury, sleep disturbance and suicidal ideas. The patient is not nervous/anxious and is not hyperactive.     There were no vitals taken for this visit.There is no height or weight on file to calculate BMI.  General Appearance: Casual  Eye Contact:  Good  Speech:  Clear and Coherent and Normal Rate  Volume:  Normal  Mood:  Euthymic  Affect:  Appropriate  Thought Process:  Coherent, Goal Directed, and Descriptions of Associations: Intact  Orientation:  Full (Time, Place, and Person)  Thought Content: WDL   Suicidal Thoughts:  No  Homicidal Thoughts:  No  Memory:  Immediate;   Fair Recent;   Fair Remote;   Fair  Judgement:  Fair  Insight:  Fair  Psychomotor Activity:  Normal  Concentration:  Concentration: Fair and Attention Span: Fair  Recall:  [employer] of Knowledge: Fair  Language: Good  Akathisia:  No  Handed:  Ambidextrous  AIMS (if indicated): done; 0  Assets:  Communication Skills Desire for Improvement Housing Social  Support Vocational/Educational  ADL's:  Intact  Cognition: WNL  Sleep:  Good   Screenings: AIMS    Flowsheet Row Video Visit from 11/17/2024 in [place]  [facility] Video Visit from 08/18/2024 in [facility] Video Visit from 06/16/2024 in [facility] Video Visit from 04/14/2024 in [facility]Dis[facility]  AIMS Total Score 0 0 0 0 0   AUDIT    Flowsheet Row Admission (Discharged) from 01/09/2024 in [facility] Admission (Discharged) [facility] Admission (Discharged) from OP [facility]  Alcohol Use Disorder Identification Test Final Score (AUDIT) 1 3 0   GAD-7    Flowsheet Row Video Visit from 11/17/2024 in [facility] Video Visit from 08/18/2024 in [facility] Video Visit from 06/16/2024 in [facility] Video Visit from 04/14/2024 in [facility] Video Visit from 02/21/2024 in [facility]  Total GAD-7 Score 0 0 1 0 1   PHQ2-9    Flowsheet Row Video Visit from 11/17/2024 in [facility] Video Visit from 08/18/2024 in [facility] Video Visit from 06/16/2024 in [facility] Video Visit from 04/14/2024 in [facility] Video Visit from 02/21/2024 in [facility]  PHQ-2 Total Score 0 0 0 0 2  PHQ-9 Total Score -- -- -- -- 9   Flowsheet Row Video Visit from 11/17/2024 in [facility] Video Visit from 08/18/2024 in [facility] Video Visit from 06/16/2024 in [facility]  C-SSRS RISK CATEGORY Moderate Risk Moderate Risk Moderate Risk     Assessment and Plan:   Antoine M. Armendarez is a 44 year old male with a past psychiatric history significant for schizoaffective disorder (bipolar type) and cannabis abuse who presents to [facility] via virtual video visit for follow-up and medication management.  Patient presents to the encounter stating that he has been taking his medications regularly and denies experiencing any adverse side effects at this time.  An aims assessment was performed with the patient scoring a 0.  Patient denies overt depressive symptoms nor does he endorse anxiety.  A PHQ 2 screen was performed with the patient scoring a 0.  A GAD-7 screen was also performed with the patient scoring a 0.  Patient denies changes in his behavior nor does he endorse paranoia.  Patient does not appear to be responding to internal/external stimuli.  Patient endorses stability on his current medication regimen and would like to continue taking his medications as prescribed.  Patient's medications to be e-prescribed to pharmacy of choice.  Patient's EKG and labs are up-to-date.  Patient's last EKG was performed on 01/09/2024.  A [place] Suicide Severity Rating Scale was performed with the patient being considered moderate risk.  Patient denies suicidal ideations and is able to contract for safety at this time.  Safety planning was discussed with the patient prior to the conclusion of the encounter.  - Patient was instructed to contact 911 in the event of a mental health crisis. - Patient was instructed to contact [facility] [employer] in the event of a mental health crisis. - Patient was instructed to present to [facility] in the event of a mental health crisis.  Collaboration of Care: Collaboration of Care: Medication Management AEB provider managing patient's  psychiatric medication and Psychiatrist AEB patient being followed by a mental health provider  Patient/Guardian was advised Release of Information must be obtained prior to any record release in order to collaborate their care with an outside provider. Patient/Guardian was advised if they have not already done so to contact the registration department to sign all necessary forms in order for us  to release information regarding their care.   Consent: Patient/Guardian gives verbal consent for treatment and assignment of benefits for services provided during this visit. Patient/Guardian expressed understanding and agreed to proceed.   1. Schizoaffective disorder, bipolar type (HCC)  - OLANZapine  (ZYPREXA ) 20 MG tablet; Take 1 tablet (20 mg total) by mouth at bedtime.  Dispense: 30 tablet; Refill: 3 - divalproex  (DEPAKOTE  ER) 250 MG 24 hr tablet; Take 3 tablets (750 mg total) by mouth 2 (two) times daily.  Dispense: 180 tablet; Refill: 3  2. Cannabis abuse (Primary)  3. Long term current use of antipsychotic medication  - EKG 12-Lead  Patient to follow up in 3 months Provider spent a total of 9 minutes with the patient/reviewing the patient's chart  Reginia FORBES Bolster, PA 11/18/2023, 2:30 PM

## 2024-12-21 ENCOUNTER — Other Ambulatory Visit (HOSPITAL_COMMUNITY)

## 2025-02-16 ENCOUNTER — Telehealth (HOSPITAL_COMMUNITY): Admitting: Physician Assistant
